# Patient Record
Sex: Female | Born: 1950 | Race: White | Hispanic: No | Smoking: Never smoker
Health system: Southern US, Community
[De-identification: ages and names within clinical notes are randomized; demographics above are authoritative.]

## PROBLEM LIST (undated history)

## (undated) DIAGNOSIS — M5431 Sciatica, right side: Secondary | ICD-10-CM

## (undated) DIAGNOSIS — E7800 Pure hypercholesterolemia, unspecified: Secondary | ICD-10-CM

## (undated) DIAGNOSIS — N840 Polyp of corpus uteri: Secondary | ICD-10-CM

## (undated) DIAGNOSIS — E119 Type 2 diabetes mellitus without complications: Secondary | ICD-10-CM

## (undated) DIAGNOSIS — I2510 Atherosclerotic heart disease of native coronary artery without angina pectoris: Secondary | ICD-10-CM

## (undated) DIAGNOSIS — N182 Chronic kidney disease, stage 2 (mild): Secondary | ICD-10-CM

## (undated) DIAGNOSIS — M1990 Unspecified osteoarthritis, unspecified site: Secondary | ICD-10-CM

## (undated) DIAGNOSIS — Z8679 Personal history of other diseases of the circulatory system: Secondary | ICD-10-CM

## (undated) DIAGNOSIS — H269 Unspecified cataract: Secondary | ICD-10-CM

## (undated) DIAGNOSIS — I10 Essential (primary) hypertension: Secondary | ICD-10-CM

## (undated) HISTORY — DX: Unspecified cataract: H26.9

## (undated) HISTORY — PX: TUBAL LIGATION: SHX77

## (undated) HISTORY — DX: Unspecified osteoarthritis, unspecified site: M19.90

## (undated) HISTORY — PX: CARDIAC CATHETERIZATION: SHX172

## (undated) HISTORY — DX: Essential (primary) hypertension: I10

## (undated) HISTORY — PX: JOINT REPLACEMENT: SHX530

## (undated) HISTORY — DX: Pure hypercholesterolemia, unspecified: E78.00

---
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
## 1977-08-15 HISTORY — PX: CHOLECYSTECTOMY OPEN: SUR202

## 1988-08-15 HISTORY — PX: TOE SURGERY: SHX1073

## 1998-01-14 ENCOUNTER — Other Ambulatory Visit: Admission: RE | Admit: 1998-01-14 | Discharge: 1998-01-14 | Payer: Self-pay | Admitting: Obstetrics and Gynecology

## 1998-11-19 ENCOUNTER — Other Ambulatory Visit: Admission: RE | Admit: 1998-11-19 | Discharge: 1998-11-19 | Payer: Self-pay | Admitting: Obstetrics and Gynecology

## 2002-09-15 DIAGNOSIS — I2510 Atherosclerotic heart disease of native coronary artery without angina pectoris: Secondary | ICD-10-CM

## 2002-09-15 HISTORY — DX: Atherosclerotic heart disease of native coronary artery without angina pectoris: I25.10

## 2002-10-10 ENCOUNTER — Inpatient Hospital Stay (HOSPITAL_COMMUNITY): Admission: EM | Admit: 2002-10-10 | Discharge: 2002-10-11 | Payer: Self-pay | Admitting: *Deleted

## 2002-10-10 ENCOUNTER — Encounter: Payer: Self-pay | Admitting: *Deleted

## 2002-10-11 ENCOUNTER — Encounter (INDEPENDENT_AMBULATORY_CARE_PROVIDER_SITE_OTHER): Payer: Self-pay | Admitting: *Deleted

## 2004-11-22 ENCOUNTER — Ambulatory Visit: Payer: Self-pay

## 2004-11-25 ENCOUNTER — Inpatient Hospital Stay (HOSPITAL_BASED_OUTPATIENT_CLINIC_OR_DEPARTMENT_OTHER): Admission: RE | Admit: 2004-11-25 | Discharge: 2004-11-25 | Payer: Self-pay | Admitting: Cardiology

## 2004-11-25 ENCOUNTER — Ambulatory Visit: Payer: Self-pay | Admitting: Cardiology

## 2004-12-03 ENCOUNTER — Ambulatory Visit: Payer: Self-pay | Admitting: Cardiology

## 2004-12-31 ENCOUNTER — Ambulatory Visit: Payer: Self-pay | Admitting: Gastroenterology

## 2005-01-14 ENCOUNTER — Ambulatory Visit: Payer: Self-pay | Admitting: Gastroenterology

## 2005-11-15 ENCOUNTER — Ambulatory Visit: Payer: Self-pay | Admitting: Cardiology

## 2006-01-12 ENCOUNTER — Ambulatory Visit: Payer: Self-pay | Admitting: Gastroenterology

## 2006-02-24 ENCOUNTER — Ambulatory Visit (HOSPITAL_COMMUNITY): Admission: RE | Admit: 2006-02-24 | Discharge: 2006-02-24 | Payer: Self-pay | Admitting: Gastroenterology

## 2006-03-01 ENCOUNTER — Ambulatory Visit: Payer: Self-pay | Admitting: Gastroenterology

## 2006-12-20 ENCOUNTER — Ambulatory Visit (HOSPITAL_COMMUNITY): Admission: RE | Admit: 2006-12-20 | Discharge: 2006-12-20 | Payer: Self-pay | Admitting: Internal Medicine

## 2007-05-07 ENCOUNTER — Ambulatory Visit: Payer: Self-pay | Admitting: Gastroenterology

## 2007-10-29 DIAGNOSIS — K649 Unspecified hemorrhoids: Secondary | ICD-10-CM

## 2007-10-29 DIAGNOSIS — I10 Essential (primary) hypertension: Secondary | ICD-10-CM | POA: Insufficient documentation

## 2007-10-29 DIAGNOSIS — M159 Polyosteoarthritis, unspecified: Secondary | ICD-10-CM | POA: Insufficient documentation

## 2008-02-18 ENCOUNTER — Observation Stay (HOSPITAL_COMMUNITY): Admission: EM | Admit: 2008-02-18 | Discharge: 2008-02-19 | Payer: Self-pay | Admitting: *Deleted

## 2008-02-18 ENCOUNTER — Ambulatory Visit: Payer: Self-pay | Admitting: Cardiology

## 2008-02-19 ENCOUNTER — Ambulatory Visit: Payer: Self-pay

## 2008-03-06 ENCOUNTER — Ambulatory Visit: Payer: Self-pay | Admitting: Cardiology

## 2008-10-16 ENCOUNTER — Ambulatory Visit: Payer: Self-pay | Admitting: Cardiology

## 2009-07-15 HISTORY — PX: CATARACT EXTRACTION W/ INTRAOCULAR LENS  IMPLANT, BILATERAL: SHX1307

## 2010-05-14 ENCOUNTER — Other Ambulatory Visit: Admission: RE | Admit: 2010-05-14 | Discharge: 2010-05-14 | Payer: Self-pay | Admitting: Internal Medicine

## 2010-10-18 ENCOUNTER — Telehealth (INDEPENDENT_AMBULATORY_CARE_PROVIDER_SITE_OTHER): Payer: Self-pay | Admitting: *Deleted

## 2010-10-26 NOTE — Progress Notes (Signed)
  Request received from [facility] sent to [facility]  October 18, 2010 8:41 AM

## 2010-11-09 ENCOUNTER — Encounter: Payer: Self-pay | Admitting: Cardiology

## 2010-11-18 ENCOUNTER — Encounter: Payer: Self-pay | Admitting: Cardiology

## 2010-11-18 ENCOUNTER — Ambulatory Visit (INDEPENDENT_AMBULATORY_CARE_PROVIDER_SITE_OTHER): Payer: PRIVATE HEALTH INSURANCE | Admitting: Cardiology

## 2010-11-18 VITALS — BP 100/60 | HR 59 | Resp 18 | Ht 64.0 in | Wt 210.4 lb

## 2010-11-18 DIAGNOSIS — I10 Essential (primary) hypertension: Secondary | ICD-10-CM

## 2010-11-18 DIAGNOSIS — I219 Acute myocardial infarction, unspecified: Secondary | ICD-10-CM

## 2010-11-18 DIAGNOSIS — I5181 Takotsubo syndrome: Secondary | ICD-10-CM

## 2010-11-18 NOTE — Progress Notes (Signed)
HPI:  The patient is in for follow up. She is doing well.  She denies any chest pain and is getting along well.  In reconstructing her history from [place] and [facility] it would appear that her original presentation was with non obstructive disease, EF of 25 percent, with mitral regurg.  She underwent IVUS in a clinical protocol.  She had followup which demonstrated preserved LV function on her repeat IVUS study.  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  She denies current symptoms and continues to get along well.    Current Outpatient Prescriptions  Medication Sig Dispense Refill  . aspirin 81 MG tablet Take 81 mg by mouth daily.        Marland Kitchen atenolol (TENORMIN) 100 MG tablet Take 100 mg by mouth daily.        . Cholecalciferol (VITAMIN D-3) 5000 UNITS TABS Take 1 tablet by mouth daily.        . enalapril (VASOTEC) 10 MG tablet Take 10 mg by mouth daily.        Marland Kitchen esomeprazole (NEXIUM) 40 MG capsule Take 40 mg by mouth daily before breakfast.        . fenofibrate micronized (LOFIBRA) 134 MG capsule Take 134 mg by mouth daily before breakfast.        . hydrochlorothiazide 25 MG tablet Take 25 mg by mouth daily.        . pantoprazole (PROTONIX) 40 MG tablet Take 40 mg by mouth daily.        . potassium chloride (K-DUR,KLOR-CON) 10 MEQ tablet Take 10 mEq by mouth 2 (two) times daily.        . pravastatin (PRAVACHOL) 40 MG tablet Take 40 mg by mouth daily.        . Loratadine (CLARITIN PO) OTC with no directions included       . DISCONTD: hydrochlorothiazide (,MICROZIDE/HYDRODIURIL,) 12.5 MG capsule Take 12.5 mg by mouth daily.          No Known Allergies  Past Medical History  Diagnosis Date  . Hemorrhoids   . Arthritis   . HTN (hypertension)   . Myocardial infarction   . CAD (coronary artery disease)   . Coronary disease     nonobstructive, history of  . Discomfort in chest     musculoskeletal  . Hypercholesterolemia   . Gastric reflux     history of     Past Surgical History  Procedure Date  . Cardiac catheterization November 25 2004    left heart, minor coronary atherosclerosis  . Angioplasty   . Cholecystectomy   . Cesarean section     No family history on file.  History   Social History  . Marital Status: Divorced    Spouse Name: N/A    Number of Children: N/A  . Years of Education: N/A   Occupational History  . Not on file.   Social History Main Topics  . Smoking status: Never Smoker   . Smokeless tobacco: Not on file  . Alcohol Use: 0.0 oz/week     occasional  . Drug Use: No  . Sexually Active: Not on file   Other Topics Concern  . Not on file   Social History Narrative  . No narrative on file    ROS: Please see the HPI.  All other systems reviewed and negative.  PHYSICAL EXAM:  BP 100/60  Pulse 59  Resp 18  Ht 5\' 4"  (1.626 m)  Wt 210 lb 6.4  oz (95.437 kg)  BMI 36.12 kg/m2  General: Well developed, well nourished, in no acute distress. Head:  Normocephalic and atraumatic. Neck: no JVD Spine consistent with scoliosis. Lungs: Clear to auscultation and percussion. Heart: Normal S1 and S2.  No murmur, rubs or gallops.  Abdomen:  Normal bowel sounds; soft; non tender; no organomegaly Pulses: Pulses normal in all 4 extremities. Extremities: No clubbing or cyanosis. No edema. Neurologic: Alert and oriented x 3.  EKG:  NSR.  WNL.    ASSESSMENT AND PLAN:

## 2010-11-18 NOTE — Patient Instructions (Addendum)
Your physician has requested that you have an echocardiogram. Echocardiography is a painless test that uses sound waves to create images of your heart. It provides your doctor with information about the size and shape of your heart and how well your heart's chambers and valves are working. This procedure takes approximately one hour. There are no restrictions for this procedure.   Your physician recommends that you continue on your current medications as directed. Please refer to the Current Medication list given to you today.    Your physician recommends that you schedule a follow-up appointment as needed  

## 2010-11-24 NOTE — Assessment & Plan Note (Signed)
See above.  Would favor repeat echo to assess her LV at this point.  Her presentation had EF of 25%, and MR, but following was resolve.  If resolved, then no further workup at the present time.

## 2010-11-24 NOTE — Assessment & Plan Note (Signed)
Controlled. Followed by her primary Dr. Marlowe Shores.  He does her labs.

## 2010-11-24 NOTE — Assessment & Plan Note (Signed)
Probably on the basis of Takotsubo.  Old cath and newer report reviewed.  Marked LV dysfunction initially with recovering in follow up.

## 2010-11-24 NOTE — Telephone Encounter (Signed)
This encounter was created in error - please disregard.

## 2010-11-25 ENCOUNTER — Ambulatory Visit (HOSPITAL_COMMUNITY): Payer: PRIVATE HEALTH INSURANCE | Attending: Cardiology

## 2010-11-25 DIAGNOSIS — I379 Nonrheumatic pulmonary valve disorder, unspecified: Secondary | ICD-10-CM | POA: Insufficient documentation

## 2010-11-25 DIAGNOSIS — I059 Rheumatic mitral valve disease, unspecified: Secondary | ICD-10-CM | POA: Insufficient documentation

## 2010-11-25 DIAGNOSIS — I079 Rheumatic tricuspid valve disease, unspecified: Secondary | ICD-10-CM | POA: Insufficient documentation

## 2010-11-25 DIAGNOSIS — I10 Essential (primary) hypertension: Secondary | ICD-10-CM | POA: Insufficient documentation

## 2010-11-25 DIAGNOSIS — R079 Chest pain, unspecified: Secondary | ICD-10-CM | POA: Insufficient documentation

## 2010-11-25 DIAGNOSIS — I5181 Takotsubo syndrome: Secondary | ICD-10-CM

## 2010-11-25 DIAGNOSIS — E785 Hyperlipidemia, unspecified: Secondary | ICD-10-CM | POA: Insufficient documentation

## 2010-11-30 ENCOUNTER — Telehealth: Payer: Self-pay | Admitting: Cardiology

## 2010-11-30 NOTE — Telephone Encounter (Signed)
I spoke with the pt and made her aware of Echo results.  

## 2010-11-30 NOTE — Telephone Encounter (Signed)
Pt had echo 4-12-would like results

## 2010-12-28 NOTE — Letter (Signed)
March 06, 2008    Lucky Cowboy, MD  [address]  [place], [place] [address]   RE:  [name]  MRN:  [id number]  /  DOB:  1950/09/25   Dear [name],   I had the pleasure of seeing [name] in the office today in  followup.  Cardiac wise, she is doing well.  She has not been having any  ongoing chest pain or shortness of breath.  She feels well.  She was  admitted to the hospital with some atypical symptoms.  Chest x-ray  demonstrated no acute bony abnormalities, but evidence of  cholecystectomy and thoracic scoliosis.  There were no acute findings.  Her cardiac enzymes were negative.  She underwent a radionuclide imaging  study at our office.  This demonstrated only apical thinning.  This was  read as possible small prior infarct or breast attenuation, and given  the normal ejection fraction, and 72% EF, we felt that this was likely  breast attenuation.  She has had no further symptoms and actually is  feeling well.   MEDICATIONS:  1. Atenolol 100 mg daily.  2. Aspirin 81 mg daily.  3. Pravastatin 40 mg daily.  4. Enalapril 10 mg daily.  5. Protonix 40 daily.  6. Hydrochlorothiazide 12.5 daily.  7. K-Dur 10 b.i.d.   PHYSICAL EXAMINATION:  She is alert and oriented in no distress.  Blood  pressure is 123/78, pulse 62.  The lung fields are clear and the cardiac  rhythm is regular.   Overall, she appears to be doing well.  She has had no further symptoms.  We will see her back in 6 months' time.  She is following up with you,  and I know you will likely get a BMET on her.  Please do not hesitate to  call me at any time.    Sincerely,      Arturo Morton. Riley Kill, MD, [facility]  Electronically Signed    TDS/MedQ  DD: 03/06/2008  DT: 03/07/2008  Job #: [id number]

## 2010-12-28 NOTE — Assessment & Plan Note (Signed)
[facility]                            CARDIOLOGY OFFICE NOTE   [name], [name]                      MRN:          [id number]  DATE:10/16/2008                            DOB:          05-19-1951    [name] is in for followup.  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  Cardiac wise, she continues to get along reasonably well.  The patient  underwent catheterization in 2006.  At that time, she had normal left  ventricular function with resolution of wall motion abnormalities.  She  had an intravascular ultrasound done at that time.  She has continued to  do well from a cardiac standpoint.  She denies current chest pain.   MEDICATIONS:  1. K-Dur 10 mEq p.o. b.i.d.  2. Aspirin 81 mg daily.  3. Atenolol 100 mg daily.  4. Pravastatin 40 mg.  5. Enalapril 10 mg one daily.  6. Protonix 40 mg daily.  7. Hydrochlorothiazide 12.5 daily.  8. Claritin over-the-counter.  9. Fenofibrate 134 mg.   On physical, she is alert and oriented in no distress.  The blood  pressure is 114/80, the pulse is 57.  The lung fields are clear and the  cardiac rhythm is regular.   There is no extremity edema.   The electrocardiogram demonstrates sinus bradycardia and has a tracing.  It is otherwise unremarkable.   IMPRESSION:  1. History of nonobstructive coronary disease.  2. History of musculoskeletal chest discomfort in the past.  3. Hypertension.  4. Hypercholesterolemia.   RECOMMENDATIONS:  1. Continued followup with Dr. Michele Mcalpine.  2. Continue current medical regimen.  3. Followup in Cardiology in 1 year at which time I will recommend a      2D echocardiogram to be rechecked.     Arturo Morton. Riley Kill, MD, [facility]  Electronically Signed    TDS/MedQ  DD: 10/26/2008  DT: 10/27/2008  Job #: [id number]

## 2010-12-28 NOTE — Assessment & Plan Note (Signed)
[facility]                         GASTROENTEROLOGY OFFICE NOTE   AMEYA, VOWELL                      MRN:          [id number]  DATE:05/07/2007                            DOB:          Nov 09, 1950    PROBLEM:  Rectal pain.   REASON:  Ms. Tonelli has returned complaining of 3 days of severe rectal  pain. She has had scant amounts of bleeding. She moves her bowels  regularly. She has a history of hemorrhoids. Pain has significant  improved over the last 24 hours.   PHYSICAL EXAMINATION:  Pulse 76, blood pressure 114/70, weight 236.  RECTAL EXAM: She has external hemorrhoids with 1 partially thrombosed  hemorrhoid with 2 areas of external clot.   IMPRESSION:  Thrombosed hemorrhoid that has partially spontaneously  decompressed.   RECOMMENDATIONS:  1. Warm soaks.  2. Anusol HC suppositories.   I carefully instructed Ms. Defrank to contact me if symptoms should worsen  at which point I would send her for a surgical evaluation.     Barbette Hair. Arlyce Dice, MD,FACG  Electronically Signed    RDK/MedQ  DD: 05/07/2007  DT: 05/08/2007  Job #: [id number]   cc:   Lucky Cowboy, M.D.

## 2010-12-28 NOTE — H&P (Signed)
[name], DAM NO.:  [id number]   MEDICAL RECORD NO.:  [id number]          PATIENT TYPE:  INP   LOCATION:  2001                         FACILITY:  [facility]   PHYSICIAN:  Rollene Rotunda, MD, FACCDATE OF BIRTH:  [birth year]   DATE OF ADMISSION:  02/18/2008  DATE OF DISCHARGE:                              HISTORY & PHYSICAL   PRIMARY:  Lucky Cowboy, M.D.   CARDIOLOGIST:  Arturo Morton. Riley Kill, MD.   REASON FOR PRESENTATION:  Evaluate patient with chest pain.   HISTORY OF PRESENT ILLNESS:  The patient is a pleasant 60 year old white  female with history of nonobstructive coronary disease.  She had been  doing well.  In fact, she moved her son to a first floor apartment over  the weekend without difficulty.  This evening while she was trying to of  to bed, and lying down, and she developed chest discomfort.  It was a  substernal discomfort.  It was 5/10.  Did not radiate.  It was not made  worse with movement or breathing.  It was a hurting.  There was some  mild nausea, but no shortness of breath.  There was no diaphoresis.  It  was similar to the discomfort that she had in 2006.  She had her  daughter drive her to the ER.  By that time she got here, her pain was  essentially resolved.  Her EKG that demonstrated no acute findings.  The  first point of care markers were negative.   PAST MEDICAL HISTORY:  Nonobstructive coronary disease (the patient was  in the REVERSAL trial in 2006.  This demonstrated 30% mid-LAD stenosis.  There was no disease elsewhere.  Her EF was normal.), hypertension,  borderline diabetes.   PAST SURGICAL HISTORY:  1. Cholecystectomy.  2. Right-and-left bone spurs removed from her feet.  3. C-section x3.   ALLERGIES/INTOLERANCES:  None.   MEDICATIONS:  1. Aspirin 81 mg daily.  2. Potassium 10 mg daily.  3. Meloxicam 15 mg daily.  4. Enalapril 20 mg daily.  5. Atenolol 100 mg daily.  6. Pravastatin 40 mg daily.  7. Protonix 40  mg daily.   SOCIAL HISTORY:  The patient is divorced.  She works in a school.  She  is relatively sedentary.  She has never smoked cigarettes.  She has  three children.   FAMILY HISTORY:  Noncontributory for early coronary disease in first-  degree relatives, though her father did have an MI at age 90.   REVIEW OF SYSTEMS:  As stated in the HPI, otherwise negative for all  other systems.   PHYSICAL EXAMINATION:  GENERAL:  The patient is in no acute distress.  VITAL SIGNS:  Blood pressure 126/75, heart rate 55 and regular,  respiratory rate 16, afebrile.  HEENT: Eyes unremarkable.  Pupils equal, round, and react to light.  Fundi not visualized.  Oral mucosa unremarkable.  NECK: No jugular distention at 45 degrees, carotid upstroke brisk and  symmetric.  No bruits, no thyromegaly.  LYMPHATICS:  No cervical, axillary, or inguinal adenopathy.  LUNGS:  Clear to auscultation bilaterally.  BACK:  No costovertebral mass.  CHEST:  Unremarkable.  HEART:  PMI not displaced or sustained, S1-S2 within normal limits, no  S3-S4, no clicks, rubs, or murmurs.  ABDOMEN: Obese, positive bowel sounds.  Normal in frequency and pitch,  no bruits, no rebound, no guarding, no midline pulsatile mass.  No  hepatomegaly, no splenomegaly.  SKIN:  No rashes, no nodules.  EXTREMITIES:  2+ pulses throughout, no edema, no cyanosis, no clubbing.  NEURO:  Oriented to person, place, and time.  Cranial nerves II-XII  grossly intact, motor grossly intact.   EKG:  Sinus rhythm, rate 53, axis within normal, intervals normal  limits, no acute ST wave changes.   LABS:  WBC 9.4, hemoglobin 14.1, platelets 258, sodium 133, potassium  4.0, glucose 155, BUN 18, creatinine 0.93.   CHEST X-RAY:  No acute disease.   ASSESSMENT/PLAN:  1. Chest.  The patient's chest discomfort has some typical and some      atypical features.  It is the same pain that she had in 2006 when      she had nonobstructive disease.  There is no  objective evidence of      ischemia.  At this point, I think the pretest probability of      obstructive coronary disease as the etiology is moderate.  I will      cycle enzymes.  She will continue her home medications.  I would      suggest that if her cardiac enzymes are negative, should would be a      reasonable candidate for stress perfusion imaging.  2. Dyslipidemia.  Check a lipid profile, fasting.  She will continue      the pravastatin for now.  3. Borderline diabetes.  Will check a hemoglobin A1c.  This is      followed by primary care physician.  4. Obesity.  She will be counseled about the need to lose weight and      the proper way to do this with diet and exercise.      Rollene Rotunda, MD, [facility]  Electronically Signed     JH/MEDQ  D:  02/18/2008  T:  02/18/2008  Job:  [id number]   cc:   Arturo Morton. Riley Kill, MD, [facility]  Lucky Cowboy, M.D.

## 2010-12-28 NOTE — Discharge Summary (Signed)
[name], [name]               ACCOUNT NO.:  [id number]   MEDICAL RECORD NO.:  [id number]          PATIENT TYPE:  INP   LOCATION:  2001                         FACILITY:  [facility]   PHYSICIAN:  Arturo Morton. Riley Kill, MD, FACCDATE OF BIRTH:  [birth year]   DATE OF ADMISSION:  02/18/2008  DATE OF DISCHARGE:  02/19/2008                               DISCHARGE SUMMARY   PRIMARY CARDIOLOGIST:  Maisie Fus D. Riley Kill, MD, [facility]   PRIMARY CARE PHYSICIAN:  Lucky Cowboy, M.D.   PROCEDURES PERFORMED DURING HOSPITALIZATION:  None.   FINAL DISCHARGE DIAGNOSES:  1. Nonobstructive coronary artery disease.  2. Chest pain, probable musculoskeletal.  3. Hypertension.  4. Hypercholesterolemia.  5. History of cardiac catheterization in April 2006 revealing      nonobstructive coronary artery disease with 30% mid left anterior      descending stenosis without disease elsewhere.  Her ejection      fraction was normal.   HISTORY OF PRESENT ILLNESS:  This is a 60 year old Caucasian female with  a history of nonobstructive coronary artery disease who had been doing  well in her usual state of health until the day of admission when she  was turning over in bed and lying down and she developed chest  discomfort.  It was substernal and rated 5/10 with no radiation.  The  patient had no associated nausea or shortness of breath or diaphoresis.  The patient came to the emergency room and by that time her pain was  very resolved.  EKG demonstrated no acute findings.  The patient was  seen and admitted to rule out MI by Dr. Rollene Rotunda.  The patient had  cardiac enzymes cycled, which were found to be negative x3.  She was  scheduled for an outpatient stress Myoview at 10:00 a.m. today, day of  discharge and will follow up to have that appointment.  The patient will  also followup with Dr. Bonnee Quin post procedure.  The patient was  found to be mildly hypotensive throughout hospitalization with a blood  pressure  90 to 102 systolic.  Her enalapril was decreased from 20 mg  once a day to 10 mg once a day on discharge.  The patient's heart rate  remained in the 50s throughout hospitalization.  Her atenolol was held  the morning of discharge prior to the stress test.  All other labs were  found to be negative and the patient was anxious to return home.   LABORATORY DATA:  Current labs, troponin 0.01, 0.01, 0.01, TSH 5.7,  sodium 136, potassium 4.2, chloride 103, CO2 26, BUN 16, creatinine  0.98, glucose 123, total cholesterol 174, triglycerides 188, HDL 41, LDL  95.   PHYSICAL EXAMINATION:  Vital Signs on discharge, blood pressure 90/46,  heart rate 51, respirations 18, temperature 97.1, and O2 sat 97% on room  air.   DISCHARGE MEDICATIONS:  1. Enalapril 10 mg daily (new lower dose secondary to hypotension from      20 mg daily).  2. Aspirin 81 mg daily.  3. Potassium 10 mEq daily.  4. Protonix 40 mg daily.  5.  Pravastatin 40 mg daily.  6. Tenormin 100 mg daily (to hold this a.m. prior to stress Myoview).  7. Hydrochlorothiazide 12.5 mg daily.   ALLERGIES:  No known drug allergies.   FOLLOWUP PLANS AND APPOINTMENTS:  1. The patient is scheduled for a stress nuclear study at 10:00 a.m.      today at the [facility] Office after discharge.  2. The patient is to follow up with Dr. Bonnee Quin on March 06, 2008,      at 11:15 a.m.  3. The patient is to follow up with primary care physician for      continued medical management.  4. The patient has been advised on medication change; enalapril dose      to decrease from 20 to 10 mg daily.   Time spent with the patient to include physician time 30 minutes.      Bettey Mare. Lyman Bishop, NP      Arturo Morton. Riley Kill, MD, [facility]  Electronically Signed    KML/MEDQ  D:  02/19/2008  T:  02/19/2008  Job:  [id number]   cc:   Lucky Cowboy, M.D.

## 2010-12-31 NOTE — Op Note (Signed)
[name], [name]                           ACCOUNT NO.:  [id number]   MEDICAL RECORD NO.:  [id number]                   PATIENT TYPE:  INP   LOCATION:  3713                                 FACILITY:  [facility]   PHYSICIAN:  Vida Roller, M.D.                DATE OF BIRTH:  01-Sep-1950   DATE OF PROCEDURE:  DATE OF DISCHARGE:                                 OPERATIVE REPORT   PROCEDURES PERFORMED:  1. Left heart catheterization.  2. Selective coronary angiography.  3. Left ventriculography.   HISTORY OF PRESENT ILLNESS:  This is a 60 year old white female with a  history of hypertension who developed substernal chest pain last night  around 1800.  The pressure worsened to the point where she presented to the  emergency department around 11:30, took a sublingual nitroglycerin, had some  aspirin, and her pain went away.  She ended up having positive enzymes,  consistent with an acute myocardial infarction, and she was taken to the  cardiac catheterization laboratory after stabilization medically.   DETAILS OF THE PROCEDURE:  After obtaining informed consent, the patient was  taken to the cardiac catheterization laboratory in the fasting state.  There  she was prepped and draped in the usual sterile manner, and conscious  sedation was obtained using 1 mg of Versed and 50 mcg of fentanyl.  The  right femoral artery was cannulated using the modified Seldinger technique  with a 6 French 10 cm sheath, and left heart catheterization was performed  using a 6 French Judkins left #4, a 6 French Judkins right #4, and a 6  French angled pigtail catheter.  The pigtail catheter was advanced under  fluoroscopic guidance in the ascending aorta, and prolapsed across the  aortic valve under continuous fluoroscopic guidance.  There it was connected  to the pressure tracing system where left ventricular pressure was measured.  It was then connected to the [facility] system where apparently injection  was  done at a rate of 12 cc a second for a total of 36 cc of dye, half second  rate of rise, 600 PSI.  This image was a 30 degree RAO view.  At the  conclusion of the left ventriculogram, the catheter was reconnected to the  pressure tracing system, and then the catheter was prolapsed across the  aortic valve under continuous pressure monitoring.  The catheter was then  removed from the patient, the sheath was sown in place, and the patient was  then enrolled in the CETP trial where IVUS evaluation of her ramus coronary  artery was performed.  That will be in a separate dictation by my colleague,  Dr. Veneda Melter.  Total fluoroscopic time was 2.6 minutes.  Total iodinized  contrast was 120 cc.   RESULTS:  Aortic pressure was measured at 120/82, with a mean arterial  pressure of 99 mmHg.   The left ventricular pressure was  measured at 119/17, with an end diastolic  pressure of 29 mmHg.   Selective coronary angiography:  The left main coronary artery is a large  artery giving off a dominant circumflex coronary artery and a left anterior  descending.  There is no significant disease in the left main coronary  artery.   The left anterior descending coronary artery is a large artery with two  normal-sized diagonal branches.  There is no disease in the left anterior  descending coronary artery.   The left circumflex coronary artery is a large artery which is the dominant  vessel giving off several moderate-sized obtuse marginals, and then a small  posterior descending and posterolateral system.   There is a ramus intermedius artery with bifurcates along the anterolateral  wall.  It has a 25% stenosis in his ostium.   The right coronary artery is a small nondominant vessel which was  angiographed but unremarkable.   The left ventriculogram reveals significantly depressed left ventricular  function at 25% with anterior akinesis, apical hypokinesis, and inferior  dyskinesis.  There was  3+ mitral regurgitation.   ASSESSMENT:  1. Nonobstructive coronary disease.  2. Severe left ventricular dysfunction.  3. Rule out myocarditis.  Would recommend this patient be aggressively     treated with medical management to include ACE inhibitors, beta blockers.     She will need an echocardiogram to assess her overall wall motion, and we     will follow her from there.                                               Vida Roller, M.D.    JH/MEDQ  D:  10/10/2002  T:  10/11/2002  Job:  [id number]

## 2010-12-31 NOTE — Discharge Summary (Signed)
NAMELOISANN, ROACH                           ACCOUNT NO.:  [id number]   MEDICAL RECORD NO.:  [id number]                   PATIENT TYPE:  INP   LOCATION:  3713                                 FACILITY:  [facility]   PHYSICIAN:  Doylene Canning. Ladona Ridgel, M.D. [facility]           DATE OF BIRTH:  Sep 09, 1950   DATE OF ADMISSION:  10/10/2002  DATE OF DISCHARGE:  10/11/2002                                 DISCHARGE SUMMARY   PRIMARY DIAGNOSIS:  Chest pain.   SECONDARY DIAGNOSES:  1. Hypertension.  2. Obesity.   HISTORY OF PRESENT ILLNESS:  This is a 60 year old female with a past  medical history of hypertension who developed substernal chest pain at 6:00  p.m. the evening prior to admission, a pressure sensation.  The pain  worsened as the evening went on.  She presented to the emergency room at  11:30.  TUMS were taken with no improvement of the pain.  The pain did  improve since aspirin was given.  No nausea, vomiting, shortness of breath,  diaphoresis, or radiation of the pain.  The pain was midsternal, 5/10 in  severity, now complains of chest soreness.  Initial CK-MB and Troponins were  elevated.  She was admitted for evaluation.  The pain is not pleuritic.   HOSPITAL COURSE:  The patient was admitted and underwent a cardiac  catheterization.  An __________ of the LAD was performed for CETP study.  The patient's catheterization showed nonobstructive coronary disease with a  20% EF and 3+ MR.  She as scheduled for a TEE to rule out myocarditis.  TEE  showed mild mitral regurgitation with LV diastolic function.  She was to be  treated with medical therapy.  The patient was discharged to home on all her  previous medications.   DISCHARGE MEDICATIONS:  1. Vasotec 20 mg daily.  2. Hydrochlorothiazide 25 mg daily.  \3.  Potassium 20 mg daily.  1. Coated aspirin 325 mg daily.  2. Atenolol 10 mg daily.  3. She was to continue the Zocor as scheduled.   DISCHARGE INSTRUCTIONS:  The patient was to be  contacted by research for the  CETP study.  She was not to do any heavy lifting or strenuous activity x4  days, no driving for two.  Low-fat, low-salt, low-cholesterol diet.  She  was to call if she develops a lump in her groin or any drainage.  She was to  schedule an appointment to see a physician assistant at [facility] in two weeks  and have a 2D echocardiogram within two months.  Message was left with  office.  The patient was also instructed that she needed to call to schedule  this appointment.     Chinita Pester, C.R.N.P. [facility]                 Doylene Canning. Ladona Ridgel, M.D. [facility]    DS/MEDQ  D:  10/11/2002  T:  10/11/2002  Job:  [id number]   cc:   Lucky Cowboy, M.D.  [address], [address]  [place], [place] [address]  Fax: [phone]

## 2010-12-31 NOTE — Cardiovascular Report (Signed)
   [name], [name]                           ACCOUNT NO.:  [id number]   MEDICAL RECORD NO.:  [id number]                   PATIENT TYPE:  INP   LOCATION:  3713                                 FACILITY:  [facility]   PHYSICIAN:  Veneda Melter, M.D. [facility]               DATE OF BIRTH:  1950/08/24   DATE OF PROCEDURE:  10/10/2002  DATE OF DISCHARGE:                              CARDIAC CATHETERIZATION   PROCEDURE PERFORMED:  Intravascular ultrasound of the left anterior  descending.   DIAGNOSIS:  Coronary artery disease.   HISTORY:  The patient is a 60 year old white female who presented with  substernal chest discomfort.  She underwent cardiac catheterization by Dr.  Antoine Poche showing noncritical coronary artery disease.  She is referred for  intravascular ultrasound for participation in cholesterol-lowering study.   TECHNIQUE:  Informed consent had been obtained.  The patient was on the  table and a 6 French sheath in the right groin.  The patient was given  heparin intravenously to maintain ACT of approximately 200 seconds.  A 6  [place] guide catheter with side holes was used to engage the left  coronary artery, and selective guide shots obtained after the administration  of 200 mcg of intracoronary nitroglycerin. A 0.014 inch Hi-Torque Floppy  wire was advanced to the distal LAD and intravascular ultrasound performed  using elevated pullback.  Repeat angiography showed no evidence of vessel  damage. The guide catheter was then removed and the sheath secured in  position.  The patient was transferred to the holding area where the sheath  will be removed when the ACT returns to normal.  She tolerated the procedure  well.                                                Veneda Melter, M.D. [facility]    NG/MEDQ  D:  10/10/2002  T:  10/12/2002  Job:  [id number]   cc:   Lucky Cowboy, M.D.  [address], [address]  [place], [place] [address]  Fax: [phone]   [facility]

## 2010-12-31 NOTE — Cardiovascular Report (Signed)
NAMECLARENCE, DUNSMORE               ACCOUNT NO.:  [id number]   MEDICAL RECORD NO.:  [id number]          PATIENT TYPE:  OIB   LOCATION:  6501                         FACILITY:  [facility]   PHYSICIAN:  Arturo Morton. Riley Kill, M.D. [facility] BIRTH:  [birth year]   DATE OF PROCEDURE:  11/25/2004  DATE OF DISCHARGE:                              CARDIAC CATHETERIZATION   INDICATIONS FOR PROCEDURE:  Ms. [name] is a 60 year old female who presents  to the catheterization laboratory today for follow up research  catheterization.  The patient was enrolled in the CPEP trial and this  involves follow up catheterization and ultrasound.  She has been followed  for nearly two years, first by Dr. Chales Abrahams as part of the study, protocol  catheterization was required at two years and this had been planned since  the exception of enrollment.  The patient was informed of the risks,  benefits, and alternatives of cath.  She was agreeable to proceed.   PROCEDURE:  1.  Left heart catheterization.  2.  Selective coronary arteriography.  3.  Selective left ventriculography.  4.  Intravascular ultrasound of the left anterior descending  artery.   DESCRIPTION OF PROCEDURE:  The patient was brought to the catheterization  laboratory and prepped and draped in the usual fashion.  Through an anterior  puncture, the right femoral artery was easily entered and the 6 French  sheath was placed.  Following this, a view of the right coronary artery was  obtained.  Ventriculography was performed in the RAO projection.  Heparin  was given according to protocol at 50 units per kg.  ACTs were checked and  found to be between 200 and 300 seconds.  Following this, left coronary  artery angiography was performed with the JL4 guiding catheter.  Following  this, a Luge wire was passed down the left anterior descending  artery,  intravascular ultrasound catheter was then advanced into the mid portion of  the left anterior descending  artery.   The patient was applied to an  automatic pull back sled and follow up ultrasound was obtained for  conclusion of the study.  The patient tolerated the procedure well.  There  were no complications.  Follow up angiographic studies demonstrated no  adverse consequences from ultrasound.  Following this, the right groin was  prepped and draped.  We exchanged gloves.  An Angio-Seal device was then  utilized to close the right femoral artery.  She had no significant oozing  after application of the device.  She was taken to the holding area in  satisfactory clinical condition.  Closing ACT was 233 seconds.   PRESSURE DATA:  1.  Central aortic pressure 157/79, mean 112.  2.  Left ventricular 135/17.  3.  No gradient on pull back across the aortic valve.   ANGIOGRAPHIC DATA:  1.  Ventriculography in the RAO projection and overall systolic function      appeared to be vigorous, ejection fraction appeared to be normal.  2.  The left main coronary artery was free of critical disease.  3.  The left anterior descending  coursed  to the apex.  At the origin of the      second diagonal is an eccentric area of mild plaque where the vessel      bends with about 30% narrowing.  The distal vessel courses to the apex      and has mild luminal irregularities but is free of critical disease.  4.  There is a small ramus intermedius and previously there was noted to be      a 25% stenosis.  It is difficult to estimate this to be a reliable area      of narrowing and I do not see a specific focal area of narrowing.  5.  The circumflex provides a large marginal and posterolateral branch.      There are two marginal branches of the circumflex and its branches      appear to be free of significant disease.  6.  The right coronary artery is nondominant, there is dampening with      engagement to a small vessel size.   CONCLUSION:  1.  Normal left ventricular  function compared to the previous study, the       wall motion abnormalities and significant mitral regurgitation have      entirely resolved.  2.  Minor coronary atherosclerosis.  3.  Intravascular ultrasound demonstrating mild areas of eccentric plaquing      within the left anterior descending artery without high grade focal      obstruction through the proximal and mid vessel.   DISPOSITION:  The patient will need follow up in the office with Dr.  Riley Kill.      TDS/MEDQ  D:  11/25/2004  T:  11/25/2004  Job:  [id number]   cc:   Kirk Ruths, M.D.  [address]  [place]  [place] [address]  Fax: [phone]

## 2011-05-12 LAB — LIPID PANEL
Cholesterol: 174
LDL Cholesterol: 95

## 2011-05-12 LAB — COMPREHENSIVE METABOLIC PANEL
ALT: 22
Albumin: 3.8
Alkaline Phosphatase: 55
BUN: 16
Calcium: 9.2
Potassium: 4.2
Sodium: 136
Total Protein: 6.3

## 2011-05-12 LAB — DIFFERENTIAL
Basophils Absolute: 0.1
Basophils Relative: 1
Eosinophils Absolute: 0.3
Neutrophils Relative %: 68

## 2011-05-12 LAB — HEMOGLOBIN A1C
Hgb A1c MFr Bld: 6.1
Mean Plasma Glucose: 140

## 2011-05-12 LAB — BASIC METABOLIC PANEL
BUN: 18
CO2: 24
Chloride: 100
Creatinine, Ser: 0.93

## 2011-05-12 LAB — CBC
MCHC: 34
MCV: 87.2
Platelets: 258
WBC: 9.4

## 2011-05-12 LAB — POCT CARDIAC MARKERS
Myoglobin, poc: 106
Operator id: 270651

## 2011-05-12 LAB — CARDIAC PANEL(CRET KIN+CKTOT+MB+TROPI)
CK, MB: 0.7
CK, MB: 0.8
Relative Index: INVALID
Total CK: 63

## 2011-07-16 DIAGNOSIS — I5181 Takotsubo syndrome: Secondary | ICD-10-CM | POA: Insufficient documentation

## 2013-05-23 ENCOUNTER — Other Ambulatory Visit (HOSPITAL_COMMUNITY)
Admission: RE | Admit: 2013-05-23 | Discharge: 2013-05-23 | Disposition: A | Discharge status: Home / Self Care | Payer: BC Managed Care – PPO | Source: Ambulatory Visit | Attending: Internal Medicine | Admitting: Internal Medicine

## 2013-05-23 DIAGNOSIS — Z1151 Encounter for screening for human papillomavirus (HPV): Secondary | ICD-10-CM | POA: Insufficient documentation

## 2013-05-23 DIAGNOSIS — Z01419 Encounter for gynecological examination (general) (routine) without abnormal findings: Secondary | ICD-10-CM | POA: Insufficient documentation

## 2013-07-04 ENCOUNTER — Other Ambulatory Visit: Payer: Self-pay | Admitting: Internal Medicine

## 2013-07-04 MED ORDER — FENOFIBRATE MICRONIZED 134 MG PO CAPS: 134.0000 mg | ORAL_CAPSULE | Freq: Every day | ORAL | Status: DC

## 2013-08-27 ENCOUNTER — Encounter: Payer: Self-pay | Admitting: *Deleted

## 2013-08-28 ENCOUNTER — Ambulatory Visit (INDEPENDENT_AMBULATORY_CARE_PROVIDER_SITE_OTHER): Payer: BC Managed Care – PPO | Admitting: Emergency Medicine

## 2013-08-28 ENCOUNTER — Encounter: Payer: Self-pay | Admitting: Emergency Medicine

## 2013-08-28 VITALS — BP 118/82 | HR 64 | Temp 98.0°F | Resp 16 | Ht 64.0 in | Wt 212.0 lb

## 2013-08-28 DIAGNOSIS — I10 Essential (primary) hypertension: Secondary | ICD-10-CM

## 2013-08-28 DIAGNOSIS — R202 Paresthesia of skin: Secondary | ICD-10-CM

## 2013-08-28 DIAGNOSIS — R209 Unspecified disturbances of skin sensation: Secondary | ICD-10-CM

## 2013-08-28 DIAGNOSIS — Z79899 Other long term (current) drug therapy: Secondary | ICD-10-CM

## 2013-08-28 DIAGNOSIS — E782 Mixed hyperlipidemia: Secondary | ICD-10-CM

## 2013-08-28 DIAGNOSIS — E119 Type 2 diabetes mellitus without complications: Secondary | ICD-10-CM

## 2013-08-28 LAB — CBC WITH DIFFERENTIAL/PLATELET
Basophils Absolute: 0.1 10*3/uL (ref 0.0–0.1)
Basophils Relative: 1 % (ref 0–1)
EOS ABS: 0.4 10*3/uL (ref 0.0–0.7)
Eosinophils Relative: 4 % (ref 0–5)
HCT: 44.1 % (ref 36.0–46.0)
Hemoglobin: 14.8 g/dL (ref 12.0–15.0)
LYMPHS ABS: 2.6 10*3/uL (ref 0.7–4.0)
LYMPHS PCT: 26 % (ref 12–46)
MCH: 28.5 pg (ref 26.0–34.0)
MCHC: 33.6 g/dL (ref 30.0–36.0)
MCV: 85 fL (ref 78.0–100.0)
Monocytes Absolute: 0.6 10*3/uL (ref 0.1–1.0)
Monocytes Relative: 7 % (ref 3–12)
NEUTROS ABS: 6.2 10*3/uL (ref 1.7–7.7)
NEUTROS PCT: 62 % (ref 43–77)
PLATELETS: 322 10*3/uL (ref 150–400)
RBC: 5.19 MIL/uL — AB (ref 3.87–5.11)
RDW: 13.5 % (ref 11.5–15.5)
WBC: 9.8 10*3/uL (ref 4.0–10.5)

## 2013-08-28 LAB — MAGNESIUM: MAGNESIUM: 2 mg/dL (ref 1.5–2.5)

## 2013-08-28 LAB — LIPID PANEL
Cholesterol: 252 mg/dL — ABNORMAL HIGH (ref 0–200)
HDL: 51 mg/dL (ref >39)
LDL CALC: 123 mg/dL — AB (ref 0–99)
Total CHOL/HDL Ratio: 4.9 Ratio
Triglycerides: 390 mg/dL — ABNORMAL HIGH (ref <150)
VLDL: 78 mg/dL — ABNORMAL HIGH (ref 0–40)

## 2013-08-28 LAB — BASIC METABOLIC PANEL WITH GFR
BUN: 16 mg/dL (ref 6–23)
CHLORIDE: 92 meq/L — AB (ref 96–112)
CO2: 23 meq/L (ref 19–32)
Calcium: 9.6 mg/dL (ref 8.4–10.5)
Creat: 0.74 mg/dL (ref 0.50–1.10)
GFR, Est African American: >89 mL/min
GFR, Est Non African American: 86 mL/min
GLUCOSE: 418 mg/dL — AB (ref 70–99)
Potassium: 4.1 mEq/L (ref 3.5–5.3)
SODIUM: 128 meq/L — AB (ref 135–145)

## 2013-08-28 LAB — HEPATIC FUNCTION PANEL
ALBUMIN: 4.4 g/dL (ref 3.5–5.2)
ALK PHOS: 79 U/L (ref 39–117)
ALT: 46 U/L — AB (ref 0–35)
AST: 23 U/L (ref 0–37)
BILIRUBIN INDIRECT: 0.5 mg/dL (ref 0.0–0.9)
Bilirubin, Direct: 0.1 mg/dL (ref 0.0–0.3)
TOTAL PROTEIN: 7.2 g/dL (ref 6.0–8.3)
Total Bilirubin: 0.6 mg/dL (ref 0.3–1.2)

## 2013-08-28 LAB — HEMOGLOBIN A1C
HEMOGLOBIN A1C: 12.4 % — AB (ref <5.7)
MEAN PLASMA GLUCOSE: 309 mg/dL — AB (ref <117)

## 2013-08-28 LAB — VITAMIN B12: VITAMIN B 12: 866 pg/mL (ref 211–911)

## 2013-08-28 LAB — TSH: TSH: 2.413 u[IU]/mL (ref 0.350–4.500)

## 2013-08-28 NOTE — Patient Instructions (Signed)
Ischemic Stroke If any of these occur 911 Blood carries oxygen to all areas of your body. A stroke happens when your blood does not flow to your brain like normal. If this happens, your brain will not get the oxygen it needs and brain tissue will die. This is an emergency. Problems (symptoms) of a stroke usually happen suddenly. You may notice them when you wake up. They can include: Loss of feeling or weakness on one side of the body (face, arm, leg). Feeling confused. Trouble talking or understanding. Trouble seeing. Trouble walking. Feeling dizzy. Loss of balance or coordination. Severe headache without a cause. Trouble reading or writing. Get help within 3 4 hours of when your problems first started. If you do not know when your problems started, get help as soon as you can. This is important.  RISK FACTORS  Risk factors are things that make it more likely for you to have a stroke. These things include: High blood pressure (hypertension). High cholesterol. Diabetes. Heart disease. Having a buildup of fatty deposits in the blood vessels. Having an abnormal heart rhythm (atrial fibrillation). Being very overweight (obese). Smoking. Taking birth control pills, especially if you smoke. Not being active. Having a diet high in fats, salt, and calories. Drinking too much alcohol. Using illegal drugs. Being African American. Being over the age of 41. Having a family history of stroke. Having a history of blood clots, stroke, warning stroke (transient ischemic attack, TIA), or heart attack. Sickle cell disease. HOME CARE Take all medicines exactly as told by your doctor. Understand all your medicine instructions. You may need to take aspirin or warfarin medicine. Take warfarin exactly as told. Taking too much or too little warfarin is dangerous. Get regular blood tests as told, including the PT and INR tests. The test results help your doctor adjust your dose of warfarin. Your PT and  INR levels must be done as often as told by your doctor. Food can cause problems with warfarin and affect the results of your blood tests. This is true for foods high in vitamin K, such as spinach, kale, broccoli, cabbage, collard and turnip greens, brussels sprouts, peas, cauliflower, seaweed, and parsley, as well as beef and pork liver, green tea, and soybean oil. Eat the same amount of food high in vitamin K. Avoid major changes in your diet. Tell your doctor before changing your diet. Talk to a food specialist (dietitian) if you have questions. Many medicines can cause problems with warfarin and affect your PT and INR test results. Tell your doctor about all medicines you take. This includes vitamins and dietary pills (supplements). Be careful with aspirin and medicines that relieve redness, soreness, and puffiness (inflammation). Do not take or stop medicines unless your doctor tells you to. Warfarin can cause a lot of bruising or bleeding. Hold pressure over cuts for longer than normal. Talk to your doctor about other side effects of warfarin. Avoid sports or activities that may cause injury or bleeding. Be careful when you shave, floss your teeth, or use sharp objects. Avoid alcoholic drinks or drink very little alcohol while taking warfarin. Tell your doctor if you change how much alcohol you drink. Tell your dentist and other doctors that you take warfarin before procedures. If you are able to swallow, eat healthy foods. Eat 5 or more servings of fruits and vegetables a day. Eat soft foods, pureed foods, or eat small bites of food so you do not choke. Follow your diet program as told, if  you are given one. Keep a healthy weight. Stay active. Try to get at least 30 minutes of activity on most or all days. Do not smoke. Limit how much alcohol you drink even if you are not taking warfarin. Moderate alcohol use is: No more than 2 drinks each day for men. No more than 1 drink each day for women  who are not pregnant. Stop abusing drugs. Keep your home safe so you do not fall. Try: Putting grab bars in the bedroom and bathroom. Raising toilet seats. Putting a seat in the shower. Go to therapy sessions (physical, occupational, and speech) as told by your doctor. Use a walker or cane at all times if told to do so. Keep all doctor visits as told. GET HELP IF: Your personality changes. You have trouble swallowing. You are seeing two of everything. You are dizzy. You have a fever. Your skin starts to break down. GET HELP RIGHT AWAY IF:  The symptoms below may be a sign of an emergency. Do not wait to see if the symptoms go away. Call for help (911 in [place] Do not drive yourself to the hospital. You have sudden weakness or numbness on the face, arm, or leg (especially on one side of the body). You have sudden trouble walking or moving your arms or legs. You have sudden confusion. You have trouble talking or understanding. You have sudden trouble seeing in one or both eyes. You lose your balance or your movements are not smooth. You have a sudden, severe headache with no known cause. You have new chest pain or you feel your heart beating in a unsteady way. You are partly or totally unaware of what is going on around you. Document Released: 07/21/2011 Document Revised: 04/03/2013 Document Reviewed: 03/11/2012 [facility] Patient Information 2014 [place]. Hyponatremia  Hyponatremia is when the salt (sodium) in your blood is low. When salt becomes low, your cells take in extra water and puff up (swell). The puffiness can happen in the whole body. It mostly affects the brain and is very serious.  HOME CARE  Only take medicine as told by your doctor.  Follow any diet instructions you were given. This includes limiting how much fluid you drink.  Keep all doctor visits for tests as told.  Avoid alcohol and drugs. GET HELP RIGHT AWAY IF:  You start to twitch and shake  (seize).  You pass out (faint).  You continue to have watery poop (diarrhea) or you throw up (vomit).  You feel sick to your stomach (nauseous).  You are tired (fatigued), have a headache, are confused, or feel weak.  Your problems that first brought you to the doctor come back.  You have trouble following your diet instructions. MAKE SURE YOU:   Understand these instructions.  Will watch your condition.  Will get help right away if you are not doing well or get worse. Document Released: 04/13/2011 Document Revised: 10/24/2011 Document Reviewed: 04/13/2011 [facility] Patient Information 2014 [place], [place].

## 2013-08-28 NOTE — Progress Notes (Signed)
Subjective:    Patient ID: [name], female    DOB: 1951/02/27, 63 y.o.   MRN: [id number]  HPI Comments: 63 yo female presents for 3 month F/U for HTN, Cholesterol, DM, D. Deficient.  She has decreased soda. She is eating better snacks/ decreased fried foods. She has lost weight since last OV.LAST LABS SODIUM 131 T 195 TG 276 L 87 BS 412 A1C 10.3MAG 1.9 B12 541 INSULIN 31 D 60 SHE NOTES BS WAS HIGH AT WORK. She had new start of metformin at last OV and notes occasional miss of 2nd dosing. She denies any SE with new RX.  SUNDAY SHE NOTICED TWITCH WITH right CHIN MUSCLE and then occasionally into right hand. She notes symptoms on/ off since then. She denies any new changes or trauma to head/ neck/ shoulder. She has FMHX of DDD but denies any personal HX of DDD.  She also has had increaed green mucus x 5 days from sinuses. She notes mild pressure increase and ears feel full. She has not been taking any OTC remedies.   Current Outpatient Prescriptions on File Prior to Visit  Medication Sig Dispense Refill  . aspirin 81 MG tablet Take 81 mg by mouth daily.        Marland Kitchen atenolol (TENORMIN) 100 MG tablet Take 100 mg by mouth daily.        . Cholecalciferol (VITAMIN D-3) 5000 UNITS TABS Take 1 tablet by mouth daily.        . enalapril (VASOTEC) 10 MG tablet Take 10 mg by mouth daily.        Marland Kitchen esomeprazole (NEXIUM) 40 MG capsule Take 40 mg by mouth daily before breakfast.        . fenofibrate micronized (LOFIBRA) 134 MG capsule Take 1 capsule (134 mg total) by mouth daily before breakfast.  30 capsule  3  . hydrochlorothiazide 25 MG tablet Take 25 mg by mouth daily.        . pravastatin (PRAVACHOL) 40 MG tablet Take 40 mg by mouth daily.         No current facility-administered medications on file prior to visit.    Review of patient's allergies indicates no known allergies.  Past Medical History  Diagnosis Date  . Hemorrhoids   . Arthritis   . HTN (hypertension)   . Myocardial infarction    . CAD (coronary artery disease)   . Coronary disease     nonobstructive, history of  . Discomfort in chest     musculoskeletal  . Hypercholesterolemia   . Gastric reflux     history of        Review of Systems  Constitutional: Positive for fatigue.  HENT: Positive for congestion, postnasal drip and sinus pressure.   Neurological: Positive for tremors and numbness.  All other systems reviewed and are negative.   BP 118/82  Pulse 64  Temp(Src) 98 F (36.7 C) (Temporal)  Resp 16  Ht 5\' 4"  (1.626 m)  Wt 212 lb (96.163 kg)  BMI 36.37 kg/m2     Objective:   Physical Exam  Nursing note and vitals reviewed. Constitutional: She is oriented to person, place, and time. She appears well-developed and well-nourished. No distress.  HENT:  Head: Normocephalic and atraumatic.  Right Ear: External ear normal.  Left Ear: External ear normal.  Nose: Nose normal.  Mouth/Throat: Oropharynx is clear and moist. No oropharyngeal exudate.  Cloudy TMs  Eyes: Conjunctivae and EOM are normal. Pupils are equal, round, and  reactive to light.  Neck: Normal range of motion. Neck supple. No JVD present. No thyromegaly present.  Cardiovascular: Normal rate, regular rhythm, normal heart sounds and intact distal pulses.   Pulmonary/Chest: Effort normal and breath sounds normal.  Abdominal: Soft. Bowel sounds are normal. She exhibits no distension and no mass. There is no tenderness. There is no rebound and no guarding.  Musculoskeletal: Normal range of motion. She exhibits no edema and no tenderness.  Lymphadenopathy:    She has no cervical adenopathy.  Neurological: She is alert and oriented to person, place, and time. She has normal reflexes. No cranial nerve deficit. Coordination normal.  Skin: Skin is warm and dry. No rash noted. No erythema. No pallor.  Psychiatric: She has a normal mood and affect. Her behavior is normal. Judgment and thought content normal.          Assessment & Plan:   1.  3 month F/U for HTN, Cholesterol,DM, D. Deficient. Needs healthy diet, cardio QD and obtain healthy weight. Check Labs, Check BP if >130/80 call office, Check BS if >200 call office 2. ? Tingling vs hyponatremia vs vitamin def- check labs if symptoms increase ER. 3. Allergic rhinitis- Allegra OTC, increase H2o, allergy hygiene explained, w/c if SX increase/ check labs

## 2013-08-29 ENCOUNTER — Encounter (HOSPITAL_COMMUNITY): Payer: Self-pay | Admitting: Emergency Medicine

## 2013-08-29 ENCOUNTER — Other Ambulatory Visit: Payer: BC Managed Care – PPO

## 2013-08-29 ENCOUNTER — Other Ambulatory Visit: Payer: Self-pay

## 2013-08-29 ENCOUNTER — Emergency Department (HOSPITAL_COMMUNITY)
Admission: EM | Admit: 2013-08-29 | Discharge: 2013-08-29 | Disposition: A | Discharge status: Home / Self Care | Payer: BC Managed Care – PPO | Attending: Emergency Medicine | Admitting: Emergency Medicine

## 2013-08-29 DIAGNOSIS — R899 Unspecified abnormal finding in specimens from other organs, systems and tissues: Secondary | ICD-10-CM

## 2013-08-29 DIAGNOSIS — E7800 Pure hypercholesterolemia, unspecified: Secondary | ICD-10-CM | POA: Insufficient documentation

## 2013-08-29 DIAGNOSIS — R739 Hyperglycemia, unspecified: Secondary | ICD-10-CM

## 2013-08-29 DIAGNOSIS — M129 Arthropathy, unspecified: Secondary | ICD-10-CM | POA: Insufficient documentation

## 2013-08-29 DIAGNOSIS — K219 Gastro-esophageal reflux disease without esophagitis: Secondary | ICD-10-CM | POA: Insufficient documentation

## 2013-08-29 DIAGNOSIS — Z9889 Other specified postprocedural states: Secondary | ICD-10-CM | POA: Insufficient documentation

## 2013-08-29 DIAGNOSIS — E785 Hyperlipidemia, unspecified: Secondary | ICD-10-CM | POA: Insufficient documentation

## 2013-08-29 DIAGNOSIS — Z9861 Coronary angioplasty status: Secondary | ICD-10-CM | POA: Insufficient documentation

## 2013-08-29 DIAGNOSIS — R5381 Other malaise: Secondary | ICD-10-CM | POA: Insufficient documentation

## 2013-08-29 DIAGNOSIS — E119 Type 2 diabetes mellitus without complications: Secondary | ICD-10-CM | POA: Insufficient documentation

## 2013-08-29 DIAGNOSIS — Z79899 Other long term (current) drug therapy: Secondary | ICD-10-CM | POA: Insufficient documentation

## 2013-08-29 DIAGNOSIS — Z7982 Long term (current) use of aspirin: Secondary | ICD-10-CM | POA: Insufficient documentation

## 2013-08-29 DIAGNOSIS — I10 Essential (primary) hypertension: Secondary | ICD-10-CM | POA: Insufficient documentation

## 2013-08-29 DIAGNOSIS — I252 Old myocardial infarction: Secondary | ICD-10-CM | POA: Insufficient documentation

## 2013-08-29 DIAGNOSIS — R209 Unspecified disturbances of skin sensation: Secondary | ICD-10-CM | POA: Insufficient documentation

## 2013-08-29 DIAGNOSIS — R5383 Other fatigue: Secondary | ICD-10-CM

## 2013-08-29 DIAGNOSIS — Z95818 Presence of other cardiac implants and grafts: Secondary | ICD-10-CM | POA: Insufficient documentation

## 2013-08-29 DIAGNOSIS — R000 Tachycardia, unspecified: Secondary | ICD-10-CM | POA: Insufficient documentation

## 2013-08-29 DIAGNOSIS — I2510 Atherosclerotic heart disease of native coronary artery without angina pectoris: Secondary | ICD-10-CM | POA: Insufficient documentation

## 2013-08-29 LAB — CBC
HEMATOCRIT: 43.2 % (ref 36.0–46.0)
Hemoglobin: 15.4 g/dL — ABNORMAL HIGH (ref 12.0–15.0)
MCH: 28.9 pg (ref 26.0–34.0)
MCHC: 35.6 g/dL (ref 30.0–36.0)
MCV: 81.2 fL (ref 78.0–100.0)
PLATELETS: 303 10*3/uL (ref 150–400)
RBC: 5.32 MIL/uL — ABNORMAL HIGH (ref 3.87–5.11)
RDW: 13 % (ref 11.5–15.5)
WBC: 8.9 10*3/uL (ref 4.0–10.5)

## 2013-08-29 LAB — URINALYSIS, ROUTINE W REFLEX MICROSCOPIC
BILIRUBIN URINE: NEGATIVE
Glucose, UA: >1000 mg/dL — AB
HGB URINE DIPSTICK: NEGATIVE
KETONES UR: 15 mg/dL — AB
Leukocytes, UA: NEGATIVE
NITRITE: NEGATIVE
PROTEIN: NEGATIVE mg/dL
Specific Gravity, Urine: 1.037 — ABNORMAL HIGH (ref 1.005–1.030)
UROBILINOGEN UA: 0.2 mg/dL (ref 0.0–1.0)
pH: 5.5 (ref 5.0–8.0)

## 2013-08-29 LAB — COMPREHENSIVE METABOLIC PANEL
ALT: 52 U/L — AB (ref 0–35)
AST: 27 U/L (ref 0–37)
Albumin: 4.7 g/dL (ref 3.5–5.2)
Alkaline Phosphatase: 91 U/L (ref 39–117)
BILIRUBIN TOTAL: 0.6 mg/dL (ref 0.3–1.2)
BUN: 14 mg/dL (ref 6–23)
CALCIUM: 9.7 mg/dL (ref 8.4–10.5)
CHLORIDE: 88 meq/L — AB (ref 96–112)
CO2: 25 meq/L (ref 19–32)
Creatinine, Ser: 0.61 mg/dL (ref 0.50–1.10)
GLUCOSE: 328 mg/dL — AB (ref 70–99)
Potassium: 3.8 mEq/L (ref 3.7–5.3)
SODIUM: 131 meq/L — AB (ref 137–147)
Total Protein: 7.9 g/dL (ref 6.0–8.3)

## 2013-08-29 LAB — BASIC METABOLIC PANEL WITH GFR
BUN: 17 mg/dL (ref 6–23)
CALCIUM: 9.6 mg/dL (ref 8.4–10.5)
CO2: 22 meq/L (ref 19–32)
CREATININE: 1 mg/dL (ref 0.50–1.10)
Chloride: 91 mEq/L — ABNORMAL LOW (ref 96–112)
GFR, Est African American: 69 mL/min
GFR, Est Non African American: 60 mL/min
Glucose, Bld: 638 mg/dL (ref 70–99)
Potassium: 4.1 mEq/L (ref 3.5–5.3)
Sodium: 129 mEq/L — ABNORMAL LOW (ref 135–145)

## 2013-08-29 LAB — URINE MICROSCOPIC-ADD ON

## 2013-08-29 LAB — INSULIN, FASTING: INSULIN FASTING, SERUM: 27 u[IU]/mL (ref 3–28)

## 2013-08-29 LAB — GLUCOSE, CAPILLARY
GLUCOSE-CAPILLARY: 374 mg/dL — AB (ref 70–99)
Glucose-Capillary: 276 mg/dL — ABNORMAL HIGH (ref 70–99)
Glucose-Capillary: 235 mg/dL — ABNORMAL HIGH (ref 70–99)

## 2013-08-29 LAB — FOLATE RBC: RBC FOLATE: 843 ng/mL (ref >280)

## 2013-08-29 MED ORDER — SODIUM CHLORIDE 0.9 % IV BOLUS (SEPSIS)
1000.0000 mL | Freq: Once | INTRAVENOUS | Status: AC
  Administered 2013-08-29: 1000 mL via INTRAVENOUS

## 2013-08-29 NOTE — ED Notes (Signed)
cbg 374 now

## 2013-08-29 NOTE — Discharge Instructions (Signed)
Please see your doctor tomorrow for repeat glucose.   Hyperglycemia Hyperglycemia occurs when the glucose (sugar) in your blood is too high. Hyperglycemia can happen for many reasons, but it most often happens to people who do not know they have diabetes or are not managing their diabetes properly.  CAUSES  Whether you have diabetes or not, there are other causes of hyperglycemia. Hyperglycemia can occur when you have diabetes, but it can also occur in other situations that you might not be as aware of, such as: Diabetes  If you have diabetes and are having problems controlling your blood glucose, hyperglycemia could occur because of some of the following reasons:  Not following your meal plan.  Not taking your diabetes medications or not taking it properly.  Exercising less or doing less activity than you normally do.  Being sick. Pre-diabetes  This cannot be ignored. Before people develop Type 2 diabetes, they almost always have "pre-diabetes." This is when your blood glucose levels are higher than normal, but not yet high enough to be diagnosed as diabetes. Research has shown that some long-term damage to the body, especially the heart and circulatory system, may already be occurring during pre-diabetes. If you take action to manage your blood glucose when you have pre-diabetes, you may delay or prevent Type 2 diabetes from developing. Stress  If you have diabetes, you may be "diet" controlled or on oral medications or insulin to control your diabetes. However, you may find that your blood glucose is higher than usual in the hospital whether you have diabetes or not. This is often referred to as "stress hyperglycemia." Stress can elevate your blood glucose. This happens because of hormones put out by the body during times of stress. If stress has been the cause of your high blood glucose, it can be followed regularly by your caregiver. That way he/she can make sure your hyperglycemia does  not continue to get worse or progress to diabetes. Steroids  Steroids are medications that act on the infection fighting system (immune system) to block inflammation or infection. One side effect can be a rise in blood glucose. Most people can produce enough extra insulin to allow for this rise, but for those who cannot, steroids make blood glucose levels go even higher. It is not unusual for steroid treatments to "uncover" diabetes that is developing. It is not always possible to determine if the hyperglycemia will go away after the steroids are stopped. A special blood test called an A1c is sometimes done to determine if your blood glucose was elevated before the steroids were started. SYMPTOMS  Thirsty.  Frequent urination.  Dry mouth.  Blurred vision.  Tired or fatigue.  Weakness.  Sleepy.  Tingling in feet or leg. DIAGNOSIS  Diagnosis is made by monitoring blood glucose in one or all of the following ways:  A1c test. This is a chemical found in your blood.  Fingerstick blood glucose monitoring.  Laboratory results. TREATMENT  First, knowing the cause of the hyperglycemia is important before the hyperglycemia can be treated. Treatment may include, but is not be limited to:  Education.  Change or adjustment in medications.  Change or adjustment in meal plan.  Treatment for an illness, infection, etc.  More frequent blood glucose monitoring.  Change in exercise plan.  Decreasing or stopping steroids.  Lifestyle changes. HOME CARE INSTRUCTIONS   Test your blood glucose as directed.  Exercise regularly. Your caregiver will give you instructions about exercise. Pre-diabetes or diabetes which comes  on with stress is helped by exercising.  Eat wholesome, balanced meals. Eat often and at regular, fixed times. Your caregiver or nutritionist will give you a meal plan to guide your sugar intake.  Being at an ideal weight is important. If needed, losing as little as 10  to 15 pounds may help improve blood glucose levels. SEEK MEDICAL CARE IF:   You have questions about medicine, activity, or diet.  You continue to have symptoms (problems such as increased thirst, urination, or weight gain). SEEK IMMEDIATE MEDICAL CARE IF:   You are vomiting or have diarrhea.  Your breath smells fruity.  You are breathing faster or slower.  You are very sleepy or incoherent.  You have numbness, tingling, or pain in your feet or hands.  You have chest pain.  Your symptoms get worse even though you have been following your caregiver's orders.  If you have any other questions or concerns. Document Released: 01/25/2001 Document Revised: 10/24/2011 Document Reviewed: 11/28/2011 [facility] Patient Information 2014 [place], [place].

## 2013-08-29 NOTE — ED Notes (Signed)
PT denies pain, dizziness or fatigue.  Pt denies facial tingling at this time as well.

## 2013-08-29 NOTE — ED Provider Notes (Signed)
CSN: [id number]     Arrival date & time 08/29/13  1218 History   First MD Initiated Contact with Patient 08/29/13 1600     Chief Complaint  Patient presents with  . Hyperglycemia    HPI: [name] is a 63 yo F with history of HTN, CAD, HLD, DM and GERD who presents with hyperglycemia. She has noted intermittent numbness in the right sided of her face and hand for 4-5 days. Occurs every 6-8 hours, will last for 20-30 minutes then resolve. She has also had some mild fatigue for this period of time as well. She was seen by her PCP yesterday, glucose noted to be >600. Repeat today still >600, so she was sent to the ED for evaluation. On arrival she has no complaints. She denies abdominal pain, vomiting, diarrhea, chest pain or SOB. Numbness has not occurred since noon today. Initial BG 374. She is prescribed metformin but does not take it regularly. She does not check her BG regularly either.    Past Medical History  Diagnosis Date  . Hemorrhoids   . Arthritis   . HTN (hypertension)   . Myocardial infarction   . CAD (coronary artery disease)   . Coronary disease     nonobstructive, history of  . Discomfort in chest     musculoskeletal  . Hypercholesterolemia   . Gastric reflux     history of  . Diabetes mellitus without complication    Past Surgical History  Procedure Laterality Date  . Cardiac catheterization  November 25 2004    left heart, minor coronary atherosclerosis  . Angioplasty    . Cholecystectomy    . Cesarean section     Family History  Problem Relation Age of Onset  . Hypertension Mother   . COPD Mother   . Thyroid disease Mother   . Heart attack Father   . Heart disease Father   . Arthritis Sister     Rheumatoid  . Hypertension Sister   . Graves' disease Sister   . Thyroid disease Sister    History  Substance Use Topics  . Smoking status: Never Smoker   . Smokeless tobacco: Not on file  . Alcohol Use: 0.0 oz/week     Comment: occasional   OB History   Grav Para Term Preterm Abortions TAB SAB Ect Mult Living                 Review of Systems  Constitutional: Positive for fatigue. Negative for fever, chills and appetite change.  Eyes: Negative for photophobia, redness and visual disturbance.  Respiratory: Negative for cough, shortness of breath and stridor.   Cardiovascular: Negative for chest pain and leg swelling.  Gastrointestinal: Negative for nausea, vomiting, abdominal pain, diarrhea and constipation.  Genitourinary: Negative for dysuria, frequency, hematuria and decreased urine volume.  Musculoskeletal: Negative for arthralgias, back pain, gait problem and myalgias.  Skin: Negative for color change and wound.  Neurological: Positive for numbness. Negative for dizziness, syncope, light-headedness and headaches.  Psychiatric/Behavioral: Negative for confusion and agitation.  All other systems reviewed and are negative.    Allergies  Review of patient's allergies indicates no known allergies.  Home Medications   Current Outpatient Rx  Name  Route  Sig  Dispense  Refill  . aspirin 81 MG tablet   Oral   Take 81 mg by mouth every morning.          Marland Kitchen atenolol (TENORMIN) 100 MG tablet   Oral   Take  100 mg by mouth every evening.          . Cholecalciferol (VITAMIN D-3) 5000 UNITS TABS   Oral   Take 2 tablets by mouth every other day. 2 on one day 1 on next day etc         . enalapril (VASOTEC) 10 MG tablet   Oral   Take 10 mg by mouth every evening.          Marland Kitchen esomeprazole (NEXIUM) 40 MG capsule   Oral   Take 40 mg by mouth every evening.          . fenofibrate micronized (LOFIBRA) 134 MG capsule   Oral   Take 134 mg by mouth every evening.         . hydrochlorothiazide 25 MG tablet   Oral   Take 25 mg by mouth every morning.          . metFORMIN (GLUCOPHAGE-XR) 500 MG 24 hr tablet   Oral   Take 500 mg by mouth 3 (three) times daily. Takes 1 at breakfast 1 at lunch and 2 at dinner         .  pravastatin (PRAVACHOL) 40 MG tablet   Oral   Take 40 mg by mouth every evening.           BP 99/70  Pulse 104  Temp(Src) 98.3 F (36.8 C) (Oral)  Resp 16  Ht 5\' 4"  (1.626 m)  Wt 215 lb 3.2 oz (97.614 kg)  BMI 36.92 kg/m2  SpO2 98%  Physical Exam  Nursing note and vitals reviewed. Constitutional: She is oriented to person, place, and time. She appears well-developed and well-nourished. No distress.  Middle age female, laying in bed, no distress   HENT:  Head: Normocephalic and atraumatic.  Mouth/Throat: Oropharynx is clear and moist.  Eyes: Conjunctivae and EOM are normal. Pupils are equal, round, and reactive to light.  Neck: Normal range of motion. Neck supple.  Cardiovascular: Regular rhythm, normal heart sounds and intact distal pulses.  Tachycardia present.   Pulmonary/Chest: Effort normal and breath sounds normal. No respiratory distress.  Abdominal: Soft. Bowel sounds are normal. There is no tenderness. There is no rebound and no guarding.  Musculoskeletal: Normal range of motion. She exhibits no edema and no tenderness.  Neurological: She is alert and oriented to person, place, and time. No cranial nerve deficit. Coordination normal. GCS eye subscore is 4. GCS verbal subscore is 5. GCS motor subscore is 6.  CN 3-12 without abnormality. 5/5 strength in all extremities. 2+ DTR's. Normal sensation. Finger to nose and heel to shin normal. Gait normal.   Skin: Skin is warm and dry. No rash noted.  Psychiatric: She has a normal mood and affect. Her behavior is normal.    ED Course  Procedures (including critical care time) Labs Review Labs Reviewed  GLUCOSE, CAPILLARY - Abnormal; Notable for the following:    Glucose-Capillary 374 (*)    All other components within normal limits  CBC - Abnormal; Notable for the following:    RBC 5.32 (*)    Hemoglobin 15.4 (*)    All other components within normal limits  COMPREHENSIVE METABOLIC PANEL - Abnormal; Notable for the  following:    Sodium 131 (*)    Chloride 88 (*)    Glucose, Bld 328 (*)    ALT 52 (*)    All other components within normal limits  URINALYSIS, ROUTINE W REFLEX MICROSCOPIC   Imaging Review No results  found.  EKG Interpretation   None       MDM   1. Hyperglycemia     63 yo F with history of HTN, CAD, HLD, DM and GERD who presents with hyperglycemia. Labs not c/w DKA as CO2 25, glucose 328, AG normal. Does appear mildly hemo concentrated. Will treat with fluids and recheck blood glucose. As for facial numbness, may be related to hyponatremia. Low suspicion for TIA given symptoms have been present for 5 days, intermittent. Doubt ACS as she has not chest pain, SOB, nausea or diaphoresis. If blood glucose improves will speak to her family doctor for close f/u and initiation of insulin therapy.    Glucose down to 235 after two liters of fluid. HR down to 95 as well. Spoke with her PCP, he will see tomorrow in clinic. Felt she was stable for outpatient management as she is not in DKA, labs reassuring, glucose <250. Advised close PCP f/u. Return precautions given.   Reviewed labs and previous medical records, utilized in MDM  Discussed case with Dr. Ashok Cordia  Clinical Impression 1. Hyperglycemia      Louretta Shorten, MD 08/29/13 2130

## 2013-08-29 NOTE — ED Notes (Addendum)
Pt reports shes had numbness and tingling on R side of chin and in R arm for past few days, went to PCP and found her blood sugar was 638 so told her to come to ED for treatment. States she did notice yesterday she was very thirsty and felt like she couldn't drink enough water. Pt had labs drawn at md office this am

## 2013-08-30 ENCOUNTER — Encounter: Payer: Self-pay | Admitting: Emergency Medicine

## 2013-08-30 ENCOUNTER — Ambulatory Visit (INDEPENDENT_AMBULATORY_CARE_PROVIDER_SITE_OTHER): Payer: BC Managed Care – PPO | Admitting: Emergency Medicine

## 2013-08-30 VITALS — BP 118/70 | HR 98 | Temp 98.0°F | Resp 18 | Ht 64.0 in | Wt 217.0 lb

## 2013-08-30 DIAGNOSIS — E876 Hypokalemia: Secondary | ICD-10-CM

## 2013-08-30 DIAGNOSIS — E119 Type 2 diabetes mellitus without complications: Secondary | ICD-10-CM

## 2013-08-30 DIAGNOSIS — R6889 Other general symptoms and signs: Secondary | ICD-10-CM

## 2013-08-30 MED ORDER — CANAGLIFLOZIN 100 MG PO TABS: 100.0000 mg | ORAL_TABLET | Freq: Every day | ORAL | Status: DC

## 2013-08-30 MED ORDER — CANAGLIFLOZIN 300 MG PO TABS: 300.0000 mg | ORAL_TABLET | Freq: Every day | ORAL | Status: DC

## 2013-08-30 MED ORDER — POTASSIUM CHLORIDE CRYS ER 10 MEQ PO TBCR: 10.0000 meq | EXTENDED_RELEASE_TABLET | Freq: Two times a day (BID) | ORAL | Status: DC

## 2013-08-30 NOTE — ED Provider Notes (Signed)
I saw and evaluated the patient, reviewed the resident's note and I agree with the findings and plan.  EKG Interpretation    Date/Time:  Thursday August 29 2013 12:46:25 EST Ventricular Rate:  114 PR Interval:  170 QRS Duration: 80 QT Interval:  322 QTC Calculation: 443 R Axis:   7 Text Interpretation:  Sinus tachycardia Confirmed by Ashok Cordia  MD, Juni Glaab ([id number]) on 08/29/2013 7:24:45 PM            Above ecg interpreted by me.  Pt w hyperglycemia. Labs. Ivf. Pt also notes transient tingling right fingers lasting a 1-2 minutes.   Recheck feels improved. Asymptomatic.   Mirna Mires, MD 08/30/13 1332

## 2013-08-30 NOTE — Patient Instructions (Signed)

## 2013-08-31 LAB — URINE CULTURE

## 2013-09-01 ENCOUNTER — Other Ambulatory Visit: Payer: Self-pay | Admitting: Internal Medicine

## 2013-09-02 NOTE — Progress Notes (Signed)
Subjective:    Patient ID: [name], female    DOB: [birth year], 63 y.o.   MRN: [id number]  HPI Comments: 63 yo noncompliant female DM recently started on MF but would miss doses QD treated at ER for BS >600 and hypokalemia. She notes she is feeling much better. The previous symptoms of tingling have improved somewhat. She is ready to become compliant with DM and RX/ diet changes.  She notes mild neck/ shoulder discomfort without obvious strain/ injury. She denies any unusual triggers. She notes + [facility] of D[facility]. She has not tried any OTC for relief. She notes pain comes and goes but has been more frequent lately. She notes dull ache without radiation.   Current Outpatient Prescriptions on File Prior to Visit  Medication Sig Dispense Refill  . aspirin 81 MG tablet Take 81 mg by mouth every morning.       Marland Kitchen atenolol (TENORMIN) 100 MG tablet Take 100 mg by mouth every evening.       . Cholecalciferol (VITAMIN D-3) 5000 UNITS TABS Take 2 tablets by mouth every other day. 2 on one day 1 on next day etc      . enalapril (VASOTEC) 10 MG tablet Take 10 mg by mouth every evening.       Marland Kitchen esomeprazole (NEXIUM) 40 MG capsule Take 40 mg by mouth every evening.       . fenofibrate micronized (LOFIBRA) 134 MG capsule Take 134 mg by mouth every evening.      . hydrochlorothiazide 25 MG tablet Take 25 mg by mouth every morning.       . metFORMIN (GLUCOPHAGE-XR) 500 MG 24 hr tablet Take 500 mg by mouth 3 (three) times daily. Takes 1 at breakfast 1 at lunch and 2 at dinner      . pravastatin (PRAVACHOL) 40 MG tablet Take 40 mg by mouth every evening.        No current facility-administered medications on file prior to visit.   Review of patient's allergies indicates no known allergies.  Past Medical History  Diagnosis Date  . Hemorrhoids   . Arthritis   . HTN (hypertension)   . Myocardial infarction   . CAD (coronary artery disease)   . Coronary disease     nonobstructive, history of  .  Discomfort in chest     musculoskeletal  . Hypercholesterolemia   . Gastric reflux     history of  . Diabetes mellitus without complication      Review of Systems  Constitutional: Positive for fatigue.  Musculoskeletal: Positive for neck pain.  All other systems reviewed and are negative.   BP 118/70  Pulse 98  Temp(Src) 98 F (36.7 C) (Temporal)  Resp 18  Ht 5\' 4"  (1.626 m)  Wt 217 lb (98.431 kg)  BMI 37.23 kg/m2     Objective:   Physical Exam  Nursing note and vitals reviewed. Constitutional: She is oriented to person, place, and time. She appears well-developed and well-nourished.  HENT:  Head: Normocephalic and atraumatic.  Right Ear: External ear normal.  Left Ear: External ear normal.  Nose: Nose normal.  Mouth/Throat: Oropharynx is clear and moist. No oropharyngeal exudate.  Eyes: Conjunctivae and EOM are normal.  Neck: Normal range of motion.  Cardiovascular: Normal rate, regular rhythm, normal heart sounds and intact distal pulses.   Pulmonary/Chest: Effort normal and breath sounds normal.  Abdominal: Soft. Bowel sounds are normal.  Musculoskeletal: Normal range of motion.  Lymphadenopathy:  She has no cervical adenopathy.  Neurological: She is alert and oriented to person, place, and time. No cranial nerve deficit. Coordination normal.  Skin: Skin is warm and dry.  Psychiatric: She has a normal mood and affect. Judgment normal.          Assessment & Plan:  1. Uncontrolled/ NON compliant DM with recent ER treatment of BS >600- Strong conversation about compliance. Add Invokana 100 mg 1 Qd x 2 weeks then increase to 300 mg qd x 2 weeks,samples given. Will consider change to combo if no SE with new X. Diabetic education material given. W/C if BS >200 2. Recent Hypokalemia corrected at ER will recheck 1 week lab only. w/c if SX increase or ER. Restart Potassium 1 QD 3. Neck strain- Hygiene explained if sx continue needs xray evaluation with [facility] of [facility]

## 2013-09-05 ENCOUNTER — Ambulatory Visit: Payer: Self-pay | Admitting: Physician Assistant

## 2013-09-11 ENCOUNTER — Other Ambulatory Visit: Payer: Self-pay | Admitting: Internal Medicine

## 2013-09-12 ENCOUNTER — Ambulatory Visit (INDEPENDENT_AMBULATORY_CARE_PROVIDER_SITE_OTHER): Payer: BC Managed Care – PPO | Admitting: Emergency Medicine

## 2013-09-12 ENCOUNTER — Encounter: Payer: Self-pay | Admitting: Emergency Medicine

## 2013-09-12 VITALS — BP 116/64 | HR 72 | Temp 98.0°F | Resp 16 | Ht 63.5 in | Wt 218.0 lb

## 2013-09-12 DIAGNOSIS — R6889 Other general symptoms and signs: Secondary | ICD-10-CM

## 2013-09-12 DIAGNOSIS — E119 Type 2 diabetes mellitus without complications: Secondary | ICD-10-CM

## 2013-09-12 DIAGNOSIS — I10 Essential (primary) hypertension: Secondary | ICD-10-CM

## 2013-09-12 NOTE — Patient Instructions (Signed)

## 2013-09-12 NOTE — Progress Notes (Signed)
   Subjective:    Patient ID: [name], female    DOB: March 08, 1951, 63 y.o.   MRN: [id number]  HPI Comments: 63 yo NONcompliant DM has improved compliance with medications after recent ER scare. BS has improved 120s-150s. She is feeling much better. She has been on Invokana 100 over 1 week and has increased to 300 x 3 days. She recently had ER visit with elevated BS above 600 and abnormal NA/ CL levels, she notes tingling of face has corrected. She is improving diet and activity level.  Last labs CREATININE     0.61   08/29/2013 BUN              14   08/29/2013 NA              131   08/29/2013 K               3.8   08/29/2013 CL               88   08/29/2013 CO2              25   08/29/2013   Diabetes Pertinent negatives for diabetes include no polyphagia.      Review of Systems  Endocrine: Negative for polyphagia.  Genitourinary: Positive for frequency.  All other systems reviewed and are negative.   BP 116/64  Pulse 72  Temp(Src) 98 F (36.7 C) (Temporal)  Resp 16  Ht 5' 3.5" (1.613 m)  Wt 218 lb (98.884 kg)  BMI 38.01 kg/m2     Objective:   Physical Exam  Nursing note and vitals reviewed. Constitutional: She is oriented to person, place, and time. She appears well-developed and well-nourished. No distress.  HENT:  Head: Normocephalic and atraumatic.  Right Ear: External ear normal.  Left Ear: External ear normal.  Nose: Nose normal.  Mouth/Throat: Oropharynx is clear and moist.  Eyes: Conjunctivae and EOM are normal.  Neck: Normal range of motion. Neck supple. No JVD present. No thyromegaly present.  Cardiovascular: Normal rate, regular rhythm, normal heart sounds and intact distal pulses.   Pulmonary/Chest: Effort normal and breath sounds normal.  Abdominal: Soft. Bowel sounds are normal. She exhibits no distension and no mass. There is no tenderness. There is no rebound and no guarding.  Musculoskeletal: Normal range of motion. She exhibits no edema and no  tenderness.  Lymphadenopathy:    She has no cervical adenopathy.  Neurological: She is alert and oriented to person, place, and time. No cranial nerve deficit.  Skin: Skin is warm and dry. No rash noted. No erythema. No pallor.  Psychiatric: She has a normal mood and affect. Her behavior is normal. Judgment and thought content normal.          Assessment & Plan:  1. DM with recent ER visit with NONcompliance with RX has improved compliance needs recheck of labs. Continue improving diet/ weight loss and exercise. SX Invokana 300 #25 given use AD. 2. Abnormal labs- recheck

## 2013-09-13 DIAGNOSIS — E119 Type 2 diabetes mellitus without complications: Secondary | ICD-10-CM | POA: Insufficient documentation

## 2013-09-13 DIAGNOSIS — E1129 Type 2 diabetes mellitus with other diabetic kidney complication: Secondary | ICD-10-CM | POA: Insufficient documentation

## 2013-09-13 LAB — HEPATIC FUNCTION PANEL
ALK PHOS: 54 U/L (ref 39–117)
ALT: 46 U/L — ABNORMAL HIGH (ref 0–35)
AST: 31 U/L (ref 0–37)
Albumin: 4.5 g/dL (ref 3.5–5.2)
Bilirubin, Direct: 0.1 mg/dL (ref 0.0–0.3)
Indirect Bilirubin: 0.5 mg/dL (ref 0.2–1.2)
TOTAL PROTEIN: 7 g/dL (ref 6.0–8.3)
Total Bilirubin: 0.6 mg/dL (ref 0.2–1.2)

## 2013-09-13 LAB — BASIC METABOLIC PANEL WITH GFR
BUN: 20 mg/dL (ref 6–23)
CHLORIDE: 101 meq/L (ref 96–112)
CO2: 24 mEq/L (ref 19–32)
CREATININE: 0.82 mg/dL (ref 0.50–1.10)
Calcium: 10 mg/dL (ref 8.4–10.5)
GFR, EST NON AFRICAN AMERICAN: 76 mL/min
GFR, Est African American: 88 mL/min
Glucose, Bld: 118 mg/dL — ABNORMAL HIGH (ref 70–99)
Potassium: 4.5 mEq/L (ref 3.5–5.3)
Sodium: 135 mEq/L (ref 135–145)

## 2013-09-17 ENCOUNTER — Encounter: Payer: Self-pay | Admitting: Emergency Medicine

## 2013-09-17 ENCOUNTER — Telehealth: Payer: Self-pay | Admitting: Internal Medicine

## 2013-09-17 ENCOUNTER — Other Ambulatory Visit: Payer: Self-pay | Admitting: Emergency Medicine

## 2013-09-17 DIAGNOSIS — E119 Type 2 diabetes mellitus without complications: Secondary | ICD-10-CM

## 2013-09-17 MED ORDER — GLUCOSE BLOOD VI STRP: ORAL_STRIP | Status: DC

## 2013-09-17 NOTE — Telephone Encounter (Signed)
PATIENT REQUESTED SAMPLES OF FREE STYLE LITE TEST STRIPS GAVE BOX OF 10.  WILL NEED RX SENT TO PHARM ALSO

## 2013-09-17 NOTE — Telephone Encounter (Signed)
FREESTYLE LITE TEST STRIPES 2TIMES DAILY TO PHARM- [facility], [place].  GAVE PT SAMPLE OF 10 STRIPES TODAY.

## 2013-10-02 ENCOUNTER — Other Ambulatory Visit: Payer: Self-pay | Admitting: Internal Medicine

## 2013-10-14 ENCOUNTER — Other Ambulatory Visit: Payer: Self-pay | Admitting: *Deleted

## 2013-10-14 DIAGNOSIS — E119 Type 2 diabetes mellitus without complications: Secondary | ICD-10-CM

## 2013-10-14 MED ORDER — CANAGLIFLOZIN 300 MG PO TABS: 300.0000 mg | ORAL_TABLET | Freq: Every day | ORAL | Status: DC

## 2013-11-06 ENCOUNTER — Other Ambulatory Visit: Payer: Self-pay | Admitting: Internal Medicine

## 2013-11-06 ENCOUNTER — Telehealth: Payer: Self-pay | Admitting: Internal Medicine

## 2013-11-06 DIAGNOSIS — E119 Type 2 diabetes mellitus without complications: Secondary | ICD-10-CM

## 2013-11-06 MED ORDER — FREESTYLE LANCETS MISC
Status: DC
Start: 2013-11-06 — End: 2014-02-18

## 2013-11-06 MED ORDER — CANAGLIFLOZIN 300 MG PO TABS: 300.0000 mg | ORAL_TABLET | Freq: Every day | ORAL | Status: DC

## 2013-11-06 NOTE — Telephone Encounter (Signed)
PT NEEDS NEW RX FOR LANCETS FOR THE FREE STYLE LITE.  [facility] DOES NOT HAVE RX. [place].  ALSO MELISSA HAS BEEN GIVING PT SAMPLE OF INVOKANA 300MG , NEEDS ANOTHER 30 DAYS OF SAMPLES OR RX TO [facility]OVE PHARM. PLEASE ADVISE PT.  THANKS KAT

## 2013-11-06 NOTE — Telephone Encounter (Signed)
Please let Omaya know Rx for Invokana an Freestyle lancets sent to drug store

## 2013-11-11 ENCOUNTER — Other Ambulatory Visit: Payer: Self-pay | Admitting: Physician Assistant

## 2013-11-11 DIAGNOSIS — E119 Type 2 diabetes mellitus without complications: Secondary | ICD-10-CM

## 2013-11-11 MED ORDER — CANAGLIFLOZIN 300 MG PO TABS
300.0000 mg | ORAL_TABLET | Freq: Every day | ORAL | Status: DC
Start: 2013-11-11 — End: 2014-02-18

## 2013-11-21 ENCOUNTER — Other Ambulatory Visit: Payer: Self-pay | Admitting: Internal Medicine

## 2013-11-21 ENCOUNTER — Other Ambulatory Visit: Payer: Self-pay | Admitting: Emergency Medicine

## 2013-12-08 ENCOUNTER — Encounter: Payer: Self-pay | Admitting: Internal Medicine

## 2013-12-08 DIAGNOSIS — E559 Vitamin D deficiency, unspecified: Secondary | ICD-10-CM | POA: Insufficient documentation

## 2013-12-08 DIAGNOSIS — E1169 Type 2 diabetes mellitus with other specified complication: Secondary | ICD-10-CM | POA: Insufficient documentation

## 2013-12-08 DIAGNOSIS — Z79899 Other long term (current) drug therapy: Secondary | ICD-10-CM | POA: Insufficient documentation

## 2013-12-08 DIAGNOSIS — E782 Mixed hyperlipidemia: Secondary | ICD-10-CM

## 2013-12-08 NOTE — Progress Notes (Signed)
Patient ID: [name], female   DOB: 1950/09/04, 63 y.o.   MRN: [id number]    This very nice 63 y.o. DWF presents for 3 month follow up with Hypertension, Hyperlipidemia, Pre-Diabetes and Vitamin D Deficiency.    HTN predates since 57. BP has been controlled at home. Today's BP: 116/70 mmHg . Patient denies any cardiac type chest pain, palpitations, dyspnea/orthopnea/PND, dizziness, claudication, or dependent edema.   Hyperlipidemia is controlled with diet & meds. Last Cholesterol was  252, Triglycerides were 390, HDL 51 and LDL 123 in Jan 2015. Patient denies myalgias or other med SE's.    Also, the patient has history of T2 NIDDM w/ Stage 2 CKD since  2011 and she did finally start Metformin and in Oct 2014 her A1c was 10.4% and she had Invokana added to her regimen in Jan 2015 when her last A1c was  12.4 in Jan 2015 . Since then she has lost 33 # and reports FBG's are ranging 90-100 mg%. Patient denies any symptoms of reactive hypoglycemia, diabetic polys, paresthesias or visual blurring.   Further, Patient has history of Vitamin D Deficiency of 20 in 2008 with last vitamin D of 72. Patient supplements vitamin D without any suspected side-effects.  Current Outpatient Prescriptions on File Prior to Visit  Medication Sig Dispense Refill  . aspirin 81 MG tablet Take 81 mg by mouth every morning.       Marland Kitchen atenolol (TENORMIN) 100 MG tablet TAKE 1 TABLET BY MOUTH EVERY DAY FOR BLOOD PRESSURE  100 tablet  1  . Canagliflozin (INVOKANA) 300 MG TABS Take 1 tablet (300 mg total) by mouth daily.  30 tablet  3  . Cholecalciferol (VITAMIN D-3) 5000 UNITS TABS Take 2 tablets by mouth every other day. 2 on one day 1 on next day etc      . enalapril (VASOTEC) 10 MG tablet Take 10 mg by mouth every evening.       Marland Kitchen esomeprazole (NEXIUM) 40 MG capsule Take 40 mg by mouth every evening.       . fenofibrate micronized (LOFIBRA) 134 MG capsule TAKE 1 CAPSULE BY MOUTH EVERY DAY BEFORE BREAKFAST  30 capsule  6   . glucose blood (FREESTYLE LITE) test strip Use as instructed  100 each  12  . hydrochlorothiazide (HYDRODIURIL) 25 MG tablet TAKE 1 TABLET BY MOUTH EVERY MORNING FOR FLUID  90 tablet  1  . Lancets (FREESTYLE) lancets Use as instructed  100 each  12  . metFORMIN (GLUCOPHAGE-XR) 500 MG 24 hr tablet Take 500 mg by mouth 3 (three) times daily. Takes 1 at breakfast 1 at lunch and 2 at dinner      . potassium chloride (K-DUR,KLOR-CON) 10 MEQ tablet TAKE 1 TABLET BY MOUTH TWICE DAILY  60 tablet  6  . pravastatin (PRAVACHOL) 40 MG tablet Take 40 mg by mouth every evening.        No current facility-administered medications on file prior to visit.     No Known Allergies  PMHx:   Past Medical History  Diagnosis Date  . Hemorrhoids   . Arthritis   . HTN (hypertension)   . Myocardial infarction   . CAD (coronary artery disease)   . Coronary disease     nonobstructive, history of  . Discomfort in chest     musculoskeletal  . Hypercholesterolemia   . Gastric reflux     history of  . Diabetes mellitus without complication    FHx:  Reviewed / unchanged  SHx:    Reviewed / unchanged   Systems Review: Constitutional: Denies fever, chills, wt changes, headaches, insomnia, fatigue, night sweats, change in appetite. Eyes: Denies redness, blurred vision, diplopia, discharge, itchy, watery eyes.  ENT: Denies discharge, congestion, post nasal drip, epistaxis, sore throat, earache, hearing loss, dental pain, tinnitus, vertigo, sinus pain, snoring.  CV: Denies chest pain, palpitations, irregular heartbeat, syncope, dyspnea, diaphoresis, orthopnea, PND, claudication, edema. Respiratory: denies cough, dyspnea, DOE, pleurisy, hoarseness, laryngitis, wheezing.  Gastrointestinal: Denies dysphagia, odynophagia, heartburn, reflux, water brash, abdominal pain or cramps, nausea, vomiting, bloating, diarrhea, constipation, hematemesis, melena, hematochezia,  or hemorrhoids. Genitourinary: Denies dysuria,  frequency, urgency, nocturia, hesitancy, discharge, hematuria, flank pain. Musculoskeletal: Denies arthralgias, myalgias, stiffness, jt. swelling, pain, limp, strain/sprain.  Skin: Denies pruritus, rash, hives, warts, acne, eczema, change in skin lesion(s). Neuro: No weakness, tremor, incoordination, spasms, paresthesia, or pain. Psychiatric: Denies confusion, memory loss, or sensory loss. Endo: Denies change in weight, skin, hair change.  Heme/Lymph: No excessive bleeding, bruising, orenlarged lymph nodes.   Exam:  BP 116/70  Pulse 76  Temp(Src) 97.7 F (36.5 C) (Temporal)  Resp 18  Ht 5\' 4"  (1.626 m)  Wt 197 lb 6.4 oz (89.54 kg)  BMI 33.87 kg/m2  Appears well nourished - in no distress. Eyes: PERRLA, EOMs, conjunctiva no swelling or erythema. Sinuses: No frontal/maxillary tenderness ENT/Mouth: EAC's clear, TM's nl w/o erythema, bulging. Nares clear w/o erythema, swelling, exudates. Oropharynx clear without erythema or exudates. Oral hygiene is good. Tongue normal, non obstructing. Hearing intact.  Neck: Supple. Thyroid nl. Car 2+/2+ without bruits, nodes or JVD. Chest: Respirations nl with BS clear & equal w/o rales, rhonchi, wheezing or stridor.  Cor: Heart sounds normal w/ regular rate and rhythm without sig. murmurs, gallops, clicks, or rubs. Peripheral pulses normal and equal  without edema.  Musculoskeletal: Full ROM all peripheral extremities, joint stability, 5/5 strength, and normal gait.  Skin: Warm, dry without exposed rashes, lesions, ecchymosis apparent.  Neuro: Cranial nerves intact, reflexes equal bilaterally. Sensory-motor testing grossly intact. Tendon reflexes grossly intact.  Pysch: Alert & oriented x 3. Insight and judgement nl & appropriate. No ideations.  Assessment and Plan:  1. Hypertension - Continue monitor blood pressure at home. Continue diet/meds same.  2. Hyperlipidemia - Continue diet/meds, exercise,& lifestyle modifications. Continue monitor  periodic cholesterol/liver & renal functions   3.T2 NIDDM w/Stage 2 CKD - Continue diet, exercise, lifestyle modifications. Monitor appropriate labs.  4. Vitamin D Deficiency - Continue supplementation.  Recommended regular exercise, BP monitoring, weight control, and discussed med and SE's. Recommended labs to assess and monitor clinical status. Further disposition pending results of labs.

## 2013-12-08 NOTE — Patient Instructions (Signed)

## 2013-12-09 ENCOUNTER — Ambulatory Visit (INDEPENDENT_AMBULATORY_CARE_PROVIDER_SITE_OTHER): Payer: BC Managed Care – PPO | Admitting: Internal Medicine

## 2013-12-09 VITALS — BP 116/70 | HR 76 | Temp 97.7°F | Resp 18 | Ht 64.0 in | Wt 197.4 lb

## 2013-12-09 DIAGNOSIS — E1129 Type 2 diabetes mellitus with other diabetic kidney complication: Secondary | ICD-10-CM

## 2013-12-09 DIAGNOSIS — I10 Essential (primary) hypertension: Secondary | ICD-10-CM

## 2013-12-09 DIAGNOSIS — E559 Vitamin D deficiency, unspecified: Secondary | ICD-10-CM

## 2013-12-09 DIAGNOSIS — Z79899 Other long term (current) drug therapy: Secondary | ICD-10-CM

## 2013-12-09 DIAGNOSIS — E782 Mixed hyperlipidemia: Secondary | ICD-10-CM

## 2013-12-09 LAB — VITAMIN D 25 HYDROXY (VIT D DEFICIENCY, FRACTURES): Vit D, 25-Hydroxy: 90 ng/mL — ABNORMAL HIGH (ref 30–89)

## 2013-12-09 LAB — CBC WITH DIFFERENTIAL/PLATELET
BASOS ABS: 0.1 10*3/uL (ref 0.0–0.1)
Basophils Relative: 1 % (ref 0–1)
Eosinophils Absolute: 0.2 10*3/uL (ref 0.0–0.7)
Eosinophils Relative: 3 % (ref 0–5)
HCT: 37.8 % (ref 36.0–46.0)
HEMOGLOBIN: 13 g/dL (ref 12.0–15.0)
Lymphocytes Relative: 23 % (ref 12–46)
Lymphs Abs: 1.7 10*3/uL (ref 0.7–4.0)
MCH: 28 pg (ref 26.0–34.0)
MCHC: 34.4 g/dL (ref 30.0–36.0)
MCV: 81.3 fL (ref 78.0–100.0)
Monocytes Absolute: 0.5 10*3/uL (ref 0.1–1.0)
Monocytes Relative: 7 % (ref 3–12)
NEUTROS ABS: 4.8 10*3/uL (ref 1.7–7.7)
NEUTROS PCT: 66 % (ref 43–77)
Platelets: 311 10*3/uL (ref 150–400)
RBC: 4.65 MIL/uL (ref 3.87–5.11)
RDW: 13.8 % (ref 11.5–15.5)
WBC: 7.3 10*3/uL (ref 4.0–10.5)

## 2013-12-09 LAB — TSH: TSH: 1.92 u[IU]/mL (ref 0.350–4.500)

## 2013-12-09 LAB — HEPATIC FUNCTION PANEL
ALT: 30 U/L (ref 0–35)
AST: 20 U/L (ref 0–37)
Albumin: 4.4 g/dL (ref 3.5–5.2)
Alkaline Phosphatase: 49 U/L (ref 39–117)
BILIRUBIN DIRECT: 0.1 mg/dL (ref 0.0–0.3)
BILIRUBIN INDIRECT: 0.4 mg/dL (ref 0.2–1.2)
BILIRUBIN TOTAL: 0.5 mg/dL (ref 0.2–1.2)
Total Protein: 6.7 g/dL (ref 6.0–8.3)

## 2013-12-09 LAB — BASIC METABOLIC PANEL WITH GFR
BUN: 18 mg/dL (ref 6–23)
CO2: 24 mEq/L (ref 19–32)
Calcium: 9.8 mg/dL (ref 8.4–10.5)
Chloride: 102 mEq/L (ref 96–112)
Creat: 0.75 mg/dL (ref 0.50–1.10)
GFR, EST NON AFRICAN AMERICAN: 85 mL/min
Glucose, Bld: 98 mg/dL (ref 70–99)
POTASSIUM: 4.3 meq/L (ref 3.5–5.3)
SODIUM: 137 meq/L (ref 135–145)

## 2013-12-09 LAB — LIPID PANEL
CHOL/HDL RATIO: 2.8 ratio
CHOLESTEROL: 155 mg/dL (ref 0–200)
HDL: 55 mg/dL (ref >39)
LDL Cholesterol: 71 mg/dL (ref 0–99)
TRIGLYCERIDES: 143 mg/dL (ref <150)
VLDL: 29 mg/dL (ref 0–40)

## 2013-12-09 LAB — HEMOGLOBIN A1C
Hgb A1c MFr Bld: 5.7 % — ABNORMAL HIGH (ref <5.7)
MEAN PLASMA GLUCOSE: 117 mg/dL — AB (ref <117)

## 2013-12-09 LAB — MAGNESIUM: Magnesium: 1.9 mg/dL (ref 1.5–2.5)

## 2013-12-10 LAB — INSULIN, FASTING: Insulin fasting, serum: 24 u[IU]/mL (ref 3–28)

## 2013-12-11 ENCOUNTER — Other Ambulatory Visit: Payer: Self-pay | Admitting: Internal Medicine

## 2014-02-18 ENCOUNTER — Encounter (HOSPITAL_COMMUNITY): Payer: Self-pay | Admitting: Pharmacy Technician

## 2014-02-18 NOTE — Progress Notes (Signed)
Dr Karren Burly-   NEED PRE OP ORDERS PLEASE-  SHE HAS PST [facility]

## 2014-02-19 ENCOUNTER — Other Ambulatory Visit (HOSPITAL_COMMUNITY): Payer: Self-pay | Admitting: Anesthesiology

## 2014-02-19 NOTE — Progress Notes (Signed)
Need orders in epic asap pre op 02-21-14 100 pm

## 2014-02-19 NOTE — Progress Notes (Signed)
ekg 08-29-13 epic

## 2014-02-20 ENCOUNTER — Other Ambulatory Visit: Payer: Self-pay | Admitting: Surgical

## 2014-02-21 ENCOUNTER — Ambulatory Visit (HOSPITAL_COMMUNITY)
Admission: RE | Admit: 2014-02-21 | Discharge: 2014-02-21 | Disposition: A | Discharge status: Home / Self Care | Payer: BC Managed Care – PPO | Source: Ambulatory Visit | Attending: Anesthesiology | Admitting: Anesthesiology

## 2014-02-21 ENCOUNTER — Encounter (HOSPITAL_COMMUNITY): Payer: Self-pay

## 2014-02-21 ENCOUNTER — Encounter (HOSPITAL_COMMUNITY)
Admission: RE | Admit: 2014-02-21 | Discharge: 2014-02-21 | Disposition: A | Discharge status: Home / Self Care | Payer: BC Managed Care – PPO | Source: Ambulatory Visit | Attending: Orthopedic Surgery | Admitting: Orthopedic Surgery

## 2014-02-21 DIAGNOSIS — I10 Essential (primary) hypertension: Secondary | ICD-10-CM | POA: Insufficient documentation

## 2014-02-21 LAB — BASIC METABOLIC PANEL
ANION GAP: 13 (ref 5–15)
BUN: 23 mg/dL (ref 6–23)
CHLORIDE: 97 meq/L (ref 96–112)
CO2: 28 mEq/L (ref 19–32)
Calcium: 10.7 mg/dL — ABNORMAL HIGH (ref 8.4–10.5)
Creatinine, Ser: 0.77 mg/dL (ref 0.50–1.10)
GFR calc non Af Amer: 88 mL/min — ABNORMAL LOW (ref >90)
Glucose, Bld: 92 mg/dL (ref 70–99)
POTASSIUM: 4.7 meq/L (ref 3.7–5.3)
Sodium: 138 mEq/L (ref 137–147)

## 2014-02-21 LAB — CBC
HCT: 44.1 % (ref 36.0–46.0)
Hemoglobin: 14.4 g/dL (ref 12.0–15.0)
MCH: 27.6 pg (ref 26.0–34.0)
MCHC: 32.7 g/dL (ref 30.0–36.0)
MCV: 84.6 fL (ref 78.0–100.0)
Platelets: 310 10*3/uL (ref 150–400)
RBC: 5.21 MIL/uL — ABNORMAL HIGH (ref 3.87–5.11)
RDW: 13.6 % (ref 11.5–15.5)
WBC: 10.2 10*3/uL (ref 4.0–10.5)

## 2014-02-21 NOTE — Patient Instructions (Addendum)
[name]  02/21/2014   Your procedure is scheduled on: Tuesday July 14th, 2015  Report to [facility] Main Entrance and follow signs to  [place] at 1115 AM.  Call this number if you have problems the morning of surgery [phone]   Remember:  Do not eat food :After Midnight.               Clear liquids midnight until 715 am day of surgery, then nothing by mouth.    CLEAR LIQUID DIET   Foods Allowed                                                                     Foods Excluded  Coffee and tea, regular and decaf                             liquids that you cannot  Plain Jell-O in any flavor                                             see through such as: Fruit ices (not with fruit pulp)                                     milk, soups, orange juice  Iced Popsicles                                    All solid food Carbonated beverages, regular and diet                                    Cranberry, grape and apple juices Sports drinks like Gatorade Lightly seasoned clear broth or consume(fat free) Sugar, honey syrup  Sample Menu Breakfast                                Lunch                                     Supper Cranberry juice                    Beef broth                            Chicken broth Jell-O                                     Grape juice                           Apple juice Coffee or tea  Jell-O                                      Popsicle                                                Coffee or tea                        Coffee or tea  _____________________________________________________________________    Take these medicines the morning of surgery with A SIP OF WATER: no meds to take                               You may not have any metal on your body including hair pins and piercings  Do not wear jewelry, make-up, lotions, powders, or deodorant.   Men may shave face and neck.  Do not bring  valuables to the hospital. [place].  Contacts, dentures or bridgework may not be worn into surgery.  Leave suitcase in the car. After surgery it may be brought to your room.  For patients admitted to the hospital, checkout time is 11:00 AM the day of discharge.   Patients discharged the day of surgery will not be allowed to drive home.  Name and phone number of your driver: son [name] [phone]  Special Instructions: N/A ________________________________________________________________________  [facility] - Preparing for Surgery Before surgery, you can play an important role.  Because skin is not sterile, your skin needs to be as free of germs as possible.  You can reduce the number of germs on your skin by washing with CHG (chlorahexidine gluconate) soap before surgery.  CHG is an antiseptic cleaner which kills germs and bonds with the skin to continue killing germs even after washing. Please DO NOT use if you have an allergy to CHG or antibacterial soaps.  If your skin becomes reddened/irritated stop using the CHG and inform your nurse when you arrive at Short Stay. Do not shave (including legs and underarms) for at least 48 hours prior to the first CHG shower.  You may shave your face/neck. Please follow these instructions carefully:  1.  Shower with CHG Soap the night before surgery and the  morning of Surgery.  2.  If you choose to wash your hair, wash your hair first as usual with your  normal  shampoo.  3.  After you shampoo, rinse your hair and body thoroughly to remove the  shampoo.                           4.  Use CHG as you would any other liquid soap.  You can apply chg directly  to the skin and wash                       Gently with a scrungie or clean washcloth.  5.  Apply the CHG Soap to your body ONLY FROM THE NECK DOWN.   Do not use on face/ open  Wound or open sores. Avoid contact with eyes, ears mouth and  genitals (private parts).                       Wash face,  Genitals (private parts) with your normal soap.             6.  Wash thoroughly, paying special attention to the area where your surgery  will be performed.  7.  Thoroughly rinse your body with warm water from the neck down.  8.  DO NOT shower/wash with your normal soap after using and rinsing off  the CHG Soap.                9.  Pat yourself dry with a clean towel.            10.  Wear clean pajamas.            11.  Place clean sheets on your bed the night of your first shower and do not  sleep with pets. Day of Surgery : Do not apply any lotions/deodorants the morning of surgery.  Please wear clean clothes to the hospital/surgery center.  FAILURE TO FOLLOW THESE INSTRUCTIONS MAY RESULT IN THE CANCELLATION OF YOUR SURGERY PATIENT SIGNATURE_________________________________  NURSE SIGNATURE__________________________________  ________________________________________________________________________

## 2014-02-25 ENCOUNTER — Encounter (HOSPITAL_COMMUNITY): Payer: BC Managed Care – PPO | Admitting: Anesthesiology

## 2014-02-25 ENCOUNTER — Ambulatory Visit (HOSPITAL_COMMUNITY)
Admission: RE | Admit: 2014-02-25 | Discharge: 2014-02-25 | Disposition: A | Discharge status: Home / Self Care | Payer: BC Managed Care – PPO | Source: Ambulatory Visit | Attending: Orthopedic Surgery | Admitting: Orthopedic Surgery

## 2014-02-25 ENCOUNTER — Encounter (HOSPITAL_COMMUNITY)
Admission: RE | Disposition: A | Discharge status: Home / Self Care | Payer: Self-pay | Source: Ambulatory Visit | Attending: Orthopedic Surgery

## 2014-02-25 ENCOUNTER — Encounter (HOSPITAL_COMMUNITY): Payer: Self-pay | Admitting: *Deleted

## 2014-02-25 ENCOUNTER — Ambulatory Visit (HOSPITAL_COMMUNITY): Payer: BC Managed Care – PPO | Admitting: Anesthesiology

## 2014-02-25 DIAGNOSIS — E7800 Pure hypercholesterolemia, unspecified: Secondary | ICD-10-CM | POA: Diagnosis not present

## 2014-02-25 DIAGNOSIS — Z7982 Long term (current) use of aspirin: Secondary | ICD-10-CM | POA: Insufficient documentation

## 2014-02-25 DIAGNOSIS — I2510 Atherosclerotic heart disease of native coronary artery without angina pectoris: Secondary | ICD-10-CM | POA: Diagnosis not present

## 2014-02-25 DIAGNOSIS — I252 Old myocardial infarction: Secondary | ICD-10-CM | POA: Insufficient documentation

## 2014-02-25 DIAGNOSIS — M6580 Other synovitis and tenosynovitis, unspecified site: Secondary | ICD-10-CM | POA: Insufficient documentation

## 2014-02-25 DIAGNOSIS — E119 Type 2 diabetes mellitus without complications: Secondary | ICD-10-CM | POA: Insufficient documentation

## 2014-02-25 DIAGNOSIS — M1712 Unilateral primary osteoarthritis, left knee: Secondary | ICD-10-CM | POA: Diagnosis present

## 2014-02-25 DIAGNOSIS — M23302 Other meniscus derangements, unspecified lateral meniscus, unspecified knee: Secondary | ICD-10-CM | POA: Insufficient documentation

## 2014-02-25 DIAGNOSIS — M1710 Unilateral primary osteoarthritis, unspecified knee: Secondary | ICD-10-CM | POA: Diagnosis not present

## 2014-02-25 DIAGNOSIS — I10 Essential (primary) hypertension: Secondary | ICD-10-CM | POA: Insufficient documentation

## 2014-02-25 DIAGNOSIS — Z79899 Other long term (current) drug therapy: Secondary | ICD-10-CM | POA: Diagnosis not present

## 2014-02-25 DIAGNOSIS — S83242A Other tear of medial meniscus, current injury, left knee, initial encounter: Secondary | ICD-10-CM

## 2014-02-25 DIAGNOSIS — M23329 Other meniscus derangements, posterior horn of medial meniscus, unspecified knee: Secondary | ICD-10-CM | POA: Insufficient documentation

## 2014-02-25 HISTORY — PX: KNEE ARTHROSCOPY WITH MEDIAL MENISECTOMY: SHX5651

## 2014-02-25 LAB — GLUCOSE, CAPILLARY
Glucose-Capillary: 101 mg/dL — ABNORMAL HIGH (ref 70–99)
Glucose-Capillary: 78 mg/dL (ref 70–99)

## 2014-02-25 SURGERY — ARTHROSCOPY, KNEE, WITH MEDIAL MENISCECTOMY
Anesthesia: General | Site: Knee | Laterality: Left

## 2014-02-25 MED ORDER — MEPERIDINE HCL 50 MG/ML IJ SOLN: 6.2500 mg | INTRAMUSCULAR | Status: DC | PRN

## 2014-02-25 MED ORDER — PROMETHAZINE HCL 25 MG/ML IJ SOLN: 6.2500 mg | INTRAMUSCULAR | Status: DC | PRN

## 2014-02-25 MED ORDER — BUPIVACAINE-EPINEPHRINE (PF) 0.25% -1:200000 IJ SOLN
INTRAMUSCULAR | Status: AC
  Filled 2014-02-25: qty 30

## 2014-02-25 MED ORDER — ONDANSETRON HCL 4 MG/2ML IJ SOLN
INTRAMUSCULAR | Status: DC | PRN
  Administered 2014-02-25: 4 mg via INTRAVENOUS

## 2014-02-25 MED ORDER — PROPOFOL 10 MG/ML IV BOLUS
INTRAVENOUS | Status: DC | PRN
  Administered 2014-02-25: 180 mg via INTRAVENOUS

## 2014-02-25 MED ORDER — BACITRACIN ZINC 500 UNIT/GM EX OINT
TOPICAL_OINTMENT | CUTANEOUS | Status: DC | PRN
  Administered 2014-02-25: 1 via TOPICAL

## 2014-02-25 MED ORDER — MIDAZOLAM HCL 5 MG/5ML IJ SOLN
INTRAMUSCULAR | Status: DC | PRN
  Administered 2014-02-25: 2 mg via INTRAVENOUS

## 2014-02-25 MED ORDER — ONDANSETRON HCL 4 MG/2ML IJ SOLN
INTRAMUSCULAR | Status: AC
  Filled 2014-02-25: qty 2

## 2014-02-25 MED ORDER — LACTATED RINGERS IV SOLN
INTRAVENOUS | Status: DC
  Administered 2014-02-25: 16:00:00 via INTRAVENOUS

## 2014-02-25 MED ORDER — FENTANYL CITRATE 0.05 MG/ML IJ SOLN
INTRAMUSCULAR | Status: DC | PRN
  Administered 2014-02-25 (×5): 50 ug via INTRAVENOUS

## 2014-02-25 MED ORDER — LIDOCAINE HCL 1 % IJ SOLN
INTRAMUSCULAR | Status: DC | PRN
  Administered 2014-02-25: 100 mg via INTRADERMAL

## 2014-02-25 MED ORDER — LACTATED RINGERS IR SOLN
Status: DC | PRN
  Administered 2014-02-25: 6000 mL

## 2014-02-25 MED ORDER — FENTANYL CITRATE 0.05 MG/ML IJ SOLN: 25.0000 ug | INTRAMUSCULAR | Status: DC | PRN

## 2014-02-25 MED ORDER — PROPOFOL 10 MG/ML IV BOLUS
INTRAVENOUS | Status: AC
  Filled 2014-02-25: qty 20

## 2014-02-25 MED ORDER — LIDOCAINE HCL (CARDIAC) 20 MG/ML IV SOLN
INTRAVENOUS | Status: AC
  Filled 2014-02-25: qty 5

## 2014-02-25 MED ORDER — MIDAZOLAM HCL 2 MG/2ML IJ SOLN
INTRAMUSCULAR | Status: AC
  Filled 2014-02-25: qty 2

## 2014-02-25 MED ORDER — OXYCODONE-ACETAMINOPHEN 10-325 MG PO TABS: 1.0000 | ORAL_TABLET | ORAL | Status: DC | PRN

## 2014-02-25 MED ORDER — ATROPINE SULFATE 0.4 MG/ML IJ SOLN
INTRAMUSCULAR | Status: DC | PRN
  Administered 2014-02-25: 0.4 mg via INTRAVENOUS

## 2014-02-25 MED ORDER — FENTANYL CITRATE 0.05 MG/ML IJ SOLN
INTRAMUSCULAR | Status: AC
  Filled 2014-02-25: qty 5

## 2014-02-25 MED ORDER — LACTATED RINGERS IV SOLN
INTRAVENOUS | Status: DC
  Administered 2014-02-25: 1000 mL via INTRAVENOUS

## 2014-02-25 MED ORDER — CEFAZOLIN SODIUM-DEXTROSE 2-3 GM-% IV SOLR
2.0000 g | INTRAVENOUS | Status: AC
  Administered 2014-02-25: 2 g via INTRAVENOUS

## 2014-02-25 MED ORDER — EPHEDRINE SULFATE 50 MG/ML IJ SOLN
INTRAMUSCULAR | Status: DC | PRN
  Administered 2014-02-25: 5 mg via INTRAVENOUS

## 2014-02-25 MED ORDER — CEFAZOLIN SODIUM-DEXTROSE 2-3 GM-% IV SOLR
INTRAVENOUS | Status: AC
  Filled 2014-02-25: qty 50

## 2014-02-25 MED ORDER — BACITRACIN ZINC 500 UNIT/GM EX OINT
TOPICAL_OINTMENT | CUTANEOUS | Status: AC
  Filled 2014-02-25: qty 28.35

## 2014-02-25 MED ORDER — BUPIVACAINE-EPINEPHRINE 0.25% -1:200000 IJ SOLN
INTRAMUSCULAR | Status: DC | PRN
  Administered 2014-02-25: 30 mL

## 2014-02-25 SURGICAL SUPPLY — 27 items
BANDAGE ELASTIC 4 VELCRO ST LF (GAUZE/BANDAGES/DRESSINGS) ×2 IMPLANT
BANDAGE ELASTIC 6 VELCRO ST LF (GAUZE/BANDAGES/DRESSINGS) ×2 IMPLANT
BLADE GREAT WHITE 4.2 (BLADE) ×2 IMPLANT
BNDG COHESIVE 6X5 TAN STRL LF (GAUZE/BANDAGES/DRESSINGS) ×2 IMPLANT
CLOTH BEACON ORANGE TIMEOUT ST (SAFETY) ×2 IMPLANT
COUNTER NEEDLE 20 DBL MAG RED (NEEDLE) ×2 IMPLANT
DRAPE LG THREE QUARTER DISP (DRAPES) ×2 IMPLANT
DRSG ADAPTIC 3X8 NADH LF (GAUZE/BANDAGES/DRESSINGS) ×2 IMPLANT
DRSG PAD ABDOMINAL 8X10 ST (GAUZE/BANDAGES/DRESSINGS) ×6 IMPLANT
DURAPREP 26ML APPLICATOR (WOUND CARE) ×2 IMPLANT
GLOVE BIOGEL PI IND STRL 8 (GLOVE) ×1 IMPLANT
GLOVE BIOGEL PI INDICATOR 8 (GLOVE) ×1
GLOVE ECLIPSE 8.0 STRL XLNG CF (GLOVE) ×4 IMPLANT
GOWN STRL REUS W/TWL XL LVL3 (GOWN DISPOSABLE) ×2 IMPLANT
MANIFOLD NEPTUNE II (INSTRUMENTS) ×2 IMPLANT
PACK ARTHROSCOPY WL (CUSTOM PROCEDURE TRAY) ×2 IMPLANT
PACK ICE MAXI GEL EZY WRAP (MISCELLANEOUS) ×2 IMPLANT
PAD ABD 8X10 STRL (GAUZE/BANDAGES/DRESSINGS) ×2 IMPLANT
PAD MASON LEG HOLDER (PIN) ×2 IMPLANT
SET ARTHROSCOPY TUBING (MISCELLANEOUS) ×1
SET ARTHROSCOPY TUBING LN (MISCELLANEOUS) ×1 IMPLANT
SPONGE GAUZE 4X4 12PLY (GAUZE/BANDAGES/DRESSINGS) ×2 IMPLANT
SUT ETHILON 3 0 PS 1 (SUTURE) ×2 IMPLANT
TOWEL OR 17X26 10 PK STRL BLUE (TOWEL DISPOSABLE) ×2 IMPLANT
TUBING CONNECTING 10 (TUBING) IMPLANT
WAND 90 DEG TURBOVAC W/CORD (SURGICAL WAND) ×2 IMPLANT
WRAP KNEE MAXI GEL POST OP (GAUZE/BANDAGES/DRESSINGS) ×2 IMPLANT

## 2014-02-25 NOTE — Progress Notes (Signed)
Dr. Marcell Barlow made aware of patient's CBG results in PACU- 78

## 2014-02-25 NOTE — Brief Op Note (Signed)
02/25/2014  3:08 PM  PATIENT:  Shan Levans  63 y.o. female  PRE-OPERATIVE DIAGNOSIS:  LEFT KNEE MEDIAL MENISCUS TEAR,Lateral Meniscus Tear and Osteoarthritis  POST-OPERATIVE DIAGNOSIS: Same as Pre-Op  PROCEDURE:  Procedure(s): LEFT KNEE ARTHROSCOPY WITH MEDIAL AND LATERAL MENISECTOMY, MICROFRACTURE, SYNOVECTOMY SUPRA PATELLA (Left),Microfracture of Medial Femoral Condyle  SURGEON:  Surgeon(s) and Role:    * Tobi Bastos, MD - Primary    ASSISTANTS:OR Nurse   ANESTHESIA:   general  EBL:     BLOOD ADMINISTERED:none  DRAINS: none   LOCAL MEDICATIONS USED:  MARCAINE 30cc of 0.25% with Epinephrine.    SPECIMEN:  No Specimen  DISPOSITION OF SPECIMEN:  N/A  COUNTS:  YES  TOURNIQUET:  * No tourniquets in log *  DICTATION: .Other Dictation: Dictation Number [id number]  PLAN OF CARE: Discharge to home after PACU  PATIENT DISPOSITION:  PACU - hemodynamically stable.   Delay start of Pharmacological VTE agent (>24hrs) due to surgical blood loss or risk of bleeding: yes

## 2014-02-25 NOTE — Transfer of Care (Signed)
Immediate Anesthesia Transfer of Care Note  Patient: [name]  Procedure(s) Performed: Procedure(s): LEFT KNEE ARTHROSCOPY WITH MEDIAL AND LATERAL MENISECTOMY, MICROFRACTURE, SYNOVECTOMY SUPRA PATELLA (Left)  Patient Location: PACU  Anesthesia Type:General  Level of Consciousness: awake, alert , oriented and patient cooperative  Airway & Oxygen Therapy: Patient Spontanous Breathing and Patient connected to face mask oxygen  Post-op Assessment: Report given to PACU RN, Post -op Vital signs reviewed and stable and Patient moving all extremities X 4  Post vital signs: Reviewed and stable  Complications: No apparent anesthesia complications

## 2014-02-25 NOTE — Anesthesia Postprocedure Evaluation (Signed)
  Anesthesia Post-op Note  Patient: [name]  Procedure(s) Performed: Procedure(s) (LRB): LEFT KNEE ARTHROSCOPY WITH MEDIAL AND LATERAL MENISECTOMY, MICROFRACTURE, SYNOVECTOMY SUPRA PATELLA (Left)  Patient Location: PACU  Anesthesia Type: General  Level of Consciousness: awake and alert   Airway and Oxygen Therapy: Patient Spontanous Breathing  Post-op Pain: mild  Post-op Assessment: Post-op Vital signs reviewed, Patient's Cardiovascular Status Stable, Respiratory Function Stable, Patent Airway and No signs of Nausea or vomiting  Last Vitals:  Filed Vitals:   02/25/14 1659  BP: 117/63  Pulse: 69  Temp: 36.4 C  Resp: 14    Post-op Vital Signs: stable   Complications: No apparent anesthesia complications

## 2014-02-25 NOTE — Interval H&P Note (Signed)
History and Physical Interval Note:  02/25/2014 1:43 PM  [name]  has presented today for surgery, with the diagnosis of LEFT KNEE MEDIAL MENISCUS TEAR  The various methods of treatment have been discussed with the patient and family. After consideration of risks, benefits and other options for treatment, the patient has consented to  Procedure(s): LEFT KNEE ARTHROSCOPY WITH MEDIAL MENISECTOMY (Left) as a surgical intervention .  The patient's history has been reviewed, patient examined, no change in status, stable for surgery.  I have reviewed the patient's chart and labs.  Questions were answered to the patient's satisfaction.     [name]

## 2014-02-25 NOTE — Discharge Instructions (Signed)

## 2014-02-25 NOTE — Anesthesia Preprocedure Evaluation (Addendum)
Anesthesia Evaluation  Patient identified by MRN, date of birth, ID band Patient awake    Reviewed: Allergy & Precautions, H&P , NPO status , Patient's Chart, lab work & pertinent test results  Airway Mallampati: II TM Distance: >3 FB Neck ROM: Full    Dental no notable dental hx.    Pulmonary neg pulmonary ROS,  breath sounds clear to auscultation  Pulmonary exam normal       Cardiovascular hypertension, Pt. on medications + CAD and + Past MI Rhythm:Regular Rate:Normal     Neuro/Psych negative neurological ROS  negative psych ROS   GI/Hepatic negative GI ROS, Neg liver ROS,   Endo/Other  diabetes, Type 2, Oral Hypoglycemic Agents  Renal/GU negative Renal ROS  negative genitourinary   Musculoskeletal negative musculoskeletal ROS (+)   Abdominal   Peds negative pediatric ROS (+)  Hematology negative hematology ROS (+)   Anesthesia Other Findings   Reproductive/Obstetrics negative OB ROS                          Anesthesia Physical Anesthesia Plan  ASA: III  Anesthesia Plan: General   Post-op Pain Management:    Induction: Intravenous  Airway Management Planned: LMA  Additional Equipment:   Intra-op Plan:   Post-operative Plan: Extubation in OR  Informed Consent: I have reviewed the patients History and Physical, chart, labs and discussed the procedure including the risks, benefits and alternatives for the proposed anesthesia with the patient or authorized representative who has indicated his/her understanding and acceptance.   Dental advisory given  Plan Discussed with: CRNA  Anesthesia Plan Comments:         Anesthesia Quick Evaluation

## 2014-02-25 NOTE — H&P (Signed)
[name] DOB: [birth year]  Chief Complaint: left knee pain  History of Present Illness The patient is a 63 year old female who presented with left knee pain. The patient reports left knee symptoms including: pain and weakness which began 6 months ago without any known injury. The patient describes the severity of the symptoms as moderate in severity. The patient describes their pain as aching and throbbing.The patient feels that the symptoms are worsening. She says that she is having discomfort at all times. It is increased with weightbearing. She does have discomfort when going up and down stairs. No specific locking or catching, but she does feel that she has decreased range of motion. She is also having soreness and tightness in the popliteal space. No groin pain or numbness and tingling in the leg. She has no prior history of injections or surgery on the knee. She had a cortisone injection in the left knee in April but did not have any relief of her symptoms. MRI of the left knee revealed a severe complex tear of the posterior horn and medial portion of the medial meniscus with displacement. She also has a horizontal tear through the anterior junction of the lateral meniscus.  Allergies No Known Drug Allergies.   Family History Cancer. Father, Maternal Grandfather, Mother, Paternal Grandfather. mother, father, grandfather mothers side and grandfather fathers side Chronic Obstructive Lung Disease. mother Heart Disease. Brother, Father. father Rheumatoid Arthritis. Sister. sister Osteoarthritis. Sister.  Social History Exercise. Exercises rarely; does running / walking, Exercises monthly; does running / walking Illicit drug use. no Living situation. live alone Children. 3 Tobacco use. Never smoker.never smoker Tobacco / smoke exposure. no Marital status. divorced Alcohol use. current drinker; drinks beer and wine; only occasionally per week Current  drinker.Currently drinks beer only occasionally per week  Medication History aspirin 81 MG tablet, Take 81 mg by mouth every morning. , Disp: , Rfl: ;   atenolol (TENORMIN) 100 MG tablet, Take 100 mg by mouth at bedtime., Disp: , Rfl: ;   Canagliflozin (INVOKANA) 300 MG TABS, Take 300 mg by mouth every morning., Disp: , Rfl: ;   Cholecalciferol (VITAMIN D-3) 5000 UNITS TABS, Take 2 tablets by mouth every other day. , Disp: , Rfl:  enalapril (VASOTEC) 10 MG tablet, Take 10 mg by mouth every evening. , Disp: , Rfl: ;   esomeprazole (NEXIUM) 40 MG capsule, Take 40 mg by mouth every evening. , Disp: , Rfl: ;   fenofibrate micronized (LOFIBRA) 134 MG capsule, Take 134 mg by mouth daily before breakfast., Disp: , Rfl: ;   hydrochlorothiazide (HYDRODIURIL) 25 MG tablet, Take 25 mg by mouth every morning., Disp: , Rfl:  metFORMIN (GLUCOPHAGE-XR) 500 MG 24 hr tablet, Take 500-1,000 mg by mouth 3 (three) times daily. Takes 1 at breakfast 1 at lunch and 2 at dinner, Disp: , Rfl: ;   potassium chloride (K-DUR,KLOR-CON) 10 MEQ tablet, Take 10 mEq by mouth 2 (two) times daily., Disp: , Rfl: ;   pravastatin (PRAVACHOL) 40 MG tablet, Take 40 mg by mouth every evening. , Disp: , Rfl:   Past Surgical History Cataract Surgery. bilateral Cesarean Delivery. 3  Gallbladder Surgery. open Hemorrhoidectomy  Past Medical History Hypertension Hypercholesterolemia Myocardial infarction Osteoarthritis  Review of Systems General:Not Present- Chills, Fever, Night Sweats, Appetite Loss, Fatigue, Feeling sick, Weight Gain and Weight Loss. Skin:Not Present- Itching, Rash, Skin Color Changes, Ulcer, Psoriasis and Change in Hair or Nails. HEENT:Not Present- Sensitivity to light, Hearing problems, Nose Bleed and  Ringing in the Ears. Neck:Not Present- Swollen Glands and Neck Mass. Respiratory:Not Present- Snoring, Chronic Cough, Bloody sputum and Dyspnea. Cardiovascular:Not Present- Shortness of Breath, Chest  Pain, Swelling of Extremities, Leg Cramps and Palpitations. Gastrointestinal:Not Present- Bloody Stool, Heartburn, Abdominal Pain, Vomiting, Nausea and Incontinence of Stool. Female Genitourinary:Not Present- Blood in Urine, Menstrual Irregularities, Frequency, Incontinence and Nocturia. Musculoskeletal:Present- Joint Pain. Not Present- Muscle Weakness, Muscle Pain, Joint Stiffness, Joint Swelling and Back Pain. Neurological:Not Present- Tingling, Numbness, Burning, Tremor, Headaches and Dizziness. Psychiatric:Not Present- Anxiety, Depression and Memory Loss. Endocrine:Not Present- Cold Intolerance, Heat Intolerance, Excessive hunger and Excessive Thirst. Hematology:Not Present- Abnormal Bleeding, Anemia, Blood Clots and Easy Bruising.  Vitals Weight: 197 lb Height: 63.75 in Body Surface Area: 2 m Body Mass Index: 34.08 kg/m Pulse: 82 (Regular) BP: 138/81 (Sitting, Left Arm, Standard)  Physical Exam Alert and oriented. She is in no acute distress. She is tender along the medial joint line. Negative McMurray. She does lack a couple of degrees of extension. Flexion back to 120 degrees. Moderate patellofemoral crepitus. No effusion noted. She does have small Baker cyst palpated in the popliteal space. Normal painless range of motion in the hips. Distal pulse is 2+. Sensation and motor function is intact in the lower extremities. Exam of the right knee is essentially unremarkable. Heart sounds normal. RRR. Lungs clear to auscultation. Abdomen soft and nontender. EOM intact.   RADIOGRAPHS AP and lateral views of the knee show that she has narrowing of the medial and patellofemoral compartment of the left knee with osteophyte formation along the medial femoral condyle. No fractures noted.  Assessment & Plan Medial and lateral meniscus tear, left knee  She has a severe complex tear of the posterior horn and medial portion of the medial meniscus with displacement. She also has a  horizontal tear through the anterior junction of the lateral meniscus. She needs an arthroscopic medial and most likely lateral meniscectomy, left knee. Dr. Gladstone Lighter discussed the possible complications which are rare such as infection and blood clots. This will be an outpatient procedure.     Ardeen Jourdain, PA-C

## 2014-02-26 ENCOUNTER — Encounter (HOSPITAL_COMMUNITY): Payer: Self-pay | Admitting: Orthopedic Surgery

## 2014-02-26 NOTE — Op Note (Signed)
[name], [name]               ACCOUNT NO.:  [id number]  MEDICAL RECORD NO.:  [id number]  LOCATION:  [facility]                         FACILITY:  [facility]  PHYSICIAN:  Kipp Brood. Shaylea Ucci, M.D.DATE OF BIRTH:  [birth year]  DATE OF PROCEDURE:  02/25/2014 DATE OF DISCHARGE:  02/25/2014                              OPERATIVE REPORT   PREOPERATIVE DIAGNOSES: 1. Osteoarthritis, left knee. 2. Torn medial meniscus, left knee. 3. Torn lateral meniscus, left knee.  POSTOPERATIVE DIAGNOSES: 1. Osteoarthritis, left knee. 2. Torn medial meniscus, left knee. 3. Torn lateral meniscus, left knee.  OPERATION: 1. Diagnostic arthroscopy, left knee. 2. Medial meniscectomy, left knee. 3. Lateral meniscectomy, left knee. 4. Synovectomy, suprapatellar pouch, left knee. 5. Abrasion chondroplasty, medial femoral condyle, left knee. 6. Microfracture technique medial femoral condyle, left knee.  PROCEDURE:  Under general anesthesia, routine orthopedic prep and draping of the left lower extremity was carried out.  The appropriate time-out was first carried out.  I also marked the appropriate left leg in the holding area.  At this time, she had 1 g of IV Ancef.  A small punctate incision was made in the suprapatellar pouch, inflow cannula was inserted, knee was distended with saline.  Another small punctate incision was made in the anterolateral joint.  The complete diagnostic arthroscopy was carried out.  I went over the lateral joint, lateral condyle was fine.  She had a torn lateral meniscus.  I did a partial lateral meniscectomy utilizing ArthroCare and also synovectomy in the lateral compartment.  I went up in the suprapatellar pouch and did a synovectomy.  She had chronic synovitis.  The cruciates were intact.  I went down into the medial joint and she had severe osteoarthritic changes.  I did an abrasion chondroplasty of the medial femoral condyle and the tibial plateau and I did a microfracture  technique on the medial femoral condyle.  I also did a partial medial meniscectomy of the posterior horn of the medial meniscus.  I thoroughly irrigated out the knee, removed the fluid, closed all 3 punctate incisions with 3-0 nylon suture.  I injected 30 mL of 0.25% Marcaine with epinephrine in knee joint and a sterile Neosporin dressing was applied.  Postop, she will be on aspirin 325 mg b.i.d. starting today and for 2 weeks as an anticoagulant.  She will also be on Percocet 10/325 for pain.  She will be on crutches, partial weightbearing as tolerated, and I will see her in 10-12 days in the office or prior to if there is a problem.          ______________________________ Kipp Brood Gladstone Lighter, M.D.     RAG/MEDQ  D:  02/25/2014  T:  02/26/2014  Job:  [id number]

## 2014-03-01 ENCOUNTER — Other Ambulatory Visit: Payer: Self-pay | Admitting: Emergency Medicine

## 2014-03-11 ENCOUNTER — Ambulatory Visit (INDEPENDENT_AMBULATORY_CARE_PROVIDER_SITE_OTHER): Payer: BC Managed Care – PPO | Admitting: Physician Assistant

## 2014-03-11 ENCOUNTER — Encounter: Payer: Self-pay | Admitting: Physician Assistant

## 2014-03-11 VITALS — BP 110/72 | HR 68 | Temp 97.7°F | Resp 16 | Wt 187.0 lb

## 2014-03-11 DIAGNOSIS — Z23 Encounter for immunization: Secondary | ICD-10-CM

## 2014-03-11 DIAGNOSIS — E559 Vitamin D deficiency, unspecified: Secondary | ICD-10-CM

## 2014-03-11 DIAGNOSIS — I10 Essential (primary) hypertension: Secondary | ICD-10-CM

## 2014-03-11 DIAGNOSIS — Z79899 Other long term (current) drug therapy: Secondary | ICD-10-CM

## 2014-03-11 DIAGNOSIS — E1129 Type 2 diabetes mellitus with other diabetic kidney complication: Secondary | ICD-10-CM

## 2014-03-11 DIAGNOSIS — E782 Mixed hyperlipidemia: Secondary | ICD-10-CM

## 2014-03-11 DIAGNOSIS — R7309 Other abnormal glucose: Secondary | ICD-10-CM

## 2014-03-11 LAB — CBC WITH DIFFERENTIAL/PLATELET
BASOS ABS: 0.1 10*3/uL (ref 0.0–0.1)
Basophils Relative: 1 % (ref 0–1)
EOS PCT: 2 % (ref 0–5)
Eosinophils Absolute: 0.2 10*3/uL (ref 0.0–0.7)
HCT: 39.9 % (ref 36.0–46.0)
Hemoglobin: 13.6 g/dL (ref 12.0–15.0)
LYMPHS ABS: 2.7 10*3/uL (ref 0.7–4.0)
Lymphocytes Relative: 32 % (ref 12–46)
MCH: 27.8 pg (ref 26.0–34.0)
MCHC: 34.1 g/dL (ref 30.0–36.0)
MCV: 81.6 fL (ref 78.0–100.0)
MONO ABS: 0.5 10*3/uL (ref 0.1–1.0)
Monocytes Relative: 6 % (ref 3–12)
Neutro Abs: 4.9 10*3/uL (ref 1.7–7.7)
Neutrophils Relative %: 59 % (ref 43–77)
Platelets: 336 10*3/uL (ref 150–400)
RBC: 4.89 MIL/uL (ref 3.87–5.11)
RDW: 14.5 % (ref 11.5–15.5)
WBC: 8.3 10*3/uL (ref 4.0–10.5)

## 2014-03-11 LAB — BASIC METABOLIC PANEL WITH GFR
BUN: 25 mg/dL — AB (ref 6–23)
CO2: 23 meq/L (ref 19–32)
CREATININE: 0.71 mg/dL (ref 0.50–1.10)
Calcium: 9.8 mg/dL (ref 8.4–10.5)
Chloride: 99 mEq/L (ref 96–112)
GFR, Est African American: >89 mL/min
GFR, Est Non African American: >89 mL/min
Glucose, Bld: 88 mg/dL (ref 70–99)
Potassium: 4.2 mEq/L (ref 3.5–5.3)
Sodium: 133 mEq/L — ABNORMAL LOW (ref 135–145)

## 2014-03-11 LAB — HEPATIC FUNCTION PANEL
ALBUMIN: 4.3 g/dL (ref 3.5–5.2)
ALT: 22 U/L (ref 0–35)
AST: 15 U/L (ref 0–37)
Alkaline Phosphatase: 44 U/L (ref 39–117)
Bilirubin, Direct: 0.1 mg/dL (ref 0.0–0.3)
Indirect Bilirubin: 0.4 mg/dL (ref 0.2–1.2)
TOTAL PROTEIN: 6.8 g/dL (ref 6.0–8.3)
Total Bilirubin: 0.5 mg/dL (ref 0.2–1.2)

## 2014-03-11 LAB — MAGNESIUM: Magnesium: 2.1 mg/dL (ref 1.5–2.5)

## 2014-03-11 LAB — HEMOGLOBIN A1C
HEMOGLOBIN A1C: 5.5 % (ref <5.7)
Mean Plasma Glucose: 111 mg/dL (ref <117)

## 2014-03-11 LAB — LIPID PANEL
Cholesterol: 174 mg/dL (ref 0–200)
HDL: 60 mg/dL (ref >39)
LDL Cholesterol: 87 mg/dL (ref 0–99)
Total CHOL/HDL Ratio: 2.9 Ratio
Triglycerides: 133 mg/dL (ref <150)
VLDL: 27 mg/dL (ref 0–40)

## 2014-03-11 LAB — TSH: TSH: 2.447 u[IU]/mL (ref 0.350–4.500)

## 2014-03-11 NOTE — Progress Notes (Signed)
Assessment and Plan:  Hypertension: Continue medication, monitor blood pressure at home. Continue DASH diet. Cholesterol: Continue diet and exercise. Check cholesterol.  Diabetes-Continue diet and exercise. Check A1C Vitamin D Def- check level and continue medications.  Obesity with co morbidities- long discussion about weight loss, diet, and exercise TDAP  Continue diet and meds as discussed. Further disposition pending results of labs. Discussed med's effects and SE's.    HPI 63 y.o. female  presents for 3 month follow up with hypertension, hyperlipidemia, diabetes and vitamin D. Her blood pressure has been controlled at home, today their BP is BP: 110/72 mmHg She does not workout currently due to recent knee surgery. She denies chest pain, shortness of breath, dizziness.  She is on cholesterol medication, pravastatin and fenofibrate, and denies myalgias. Her cholesterol is at goal. The cholesterol last visit was:   Lab Results  Component Value Date   CHOL 155 12/09/2013   HDL 55 12/09/2013   LDLCALC 71 12/09/2013   TRIG 143 12/09/2013   CHOLHDL 2.8 12/09/2013   She has been working on diet and exercise for Diabetes, she is on an ACE, she is on invokana and metformin, sugar has been running 95-110 and denies hypoglycemia , paresthesia of the feet, polydipsia, polyuria and visual disturbances. Last A1C in the office was:  Lab Results  Component Value Date   HGBA1C 5.7* 12/09/2013   Patient is on Vitamin D supplement. Lab Results  Component Value Date   VD25OH 90* 12/09/2013   Had Left knee arthroscopy on 02/27/2014 with Dr. Melvyn Novas, she is still using a cane but states she is doing better, follows up this Thursday.     BMI is Body mass index is 32.08 kg/(m^2)., she is working on diet and exercise and has done well.  Wt Readings from Last 3 Encounters:  03/11/14 187 lb (84.823 kg)  02/25/14 188 lb 3 oz (85.361 kg)  02/25/14 188 lb 3 oz (85.361 kg)   DEE with Dr. Delman Cheadle 3 weeks ago.   Visiting son in Oct and visiting new grand daughter that will be a few weeks old at that time and she would like the TDAP.   Current Medications:  Current Outpatient Prescriptions on File Prior to Visit  Medication Sig Dispense Refill  . atenolol (TENORMIN) 100 MG tablet TAKE 1 TABLET BY MOUTH EVERY DAY  90 tablet  0  . Canagliflozin (INVOKANA) 300 MG TABS Take 300 mg by mouth every morning.      . Cholecalciferol (VITAMIN D-3) 5000 UNITS TABS Take 2 tablets by mouth every other day.       . enalapril (VASOTEC) 10 MG tablet Take 10 mg by mouth every evening.       Marland Kitchen esomeprazole (NEXIUM) 40 MG capsule Take 40 mg by mouth every evening.       . fenofibrate micronized (LOFIBRA) 134 MG capsule Take 134 mg by mouth daily before breakfast.      . hydrochlorothiazide (HYDRODIURIL) 25 MG tablet Take 25 mg by mouth every morning.      . metFORMIN (GLUCOPHAGE-XR) 500 MG 24 hr tablet Take 500-1,000 mg by mouth 3 (three) times daily. Takes 1 at breakfast 1 at lunch and 2 at dinner      . oxyCODONE-acetaminophen (PERCOCET) 10-325 MG per tablet Take 1 tablet by mouth every 4 (four) hours as needed for pain.  40 tablet  0  . potassium chloride (K-DUR,KLOR-CON) 10 MEQ tablet Take 10 mEq by mouth 2 (two) times daily.      Marland Kitchen  pravastatin (PRAVACHOL) 40 MG tablet Take 40 mg by mouth every evening.        No current facility-administered medications on file prior to visit.   Medical History:  Past Medical History  Diagnosis Date  . Hemorrhoids   . Arthritis   . HTN (hypertension)   . CAD (coronary artery disease)   . Coronary disease     nonobstructive, history of  . Hypercholesterolemia   . Gastric reflux     history of  . Diabetes mellitus without complication   . Myocardial infarction 2004    slight mi x 1   Allergies: No Known Allergies   Review of Systems: [X]  = complains of  [ ]  = denies  General: Fatigue [ ]  Fever [ ]  Chills [ ]  Weakness [ ]   Insomnia [ ]  Eyes: Redness [ ]  Blurred vision  [ ]  Diplopia [ ]   ENT: Congestion [ ]  Sinus Pain [ ]  Post Nasal Drip [ ]  Sore Throat [ ]  Earache [ ]   Cardiac: Chest pain/pressure [ ]  SOB [ ]  Orthopnea [ ]   Palpitations [ ]   Paroxysmal nocturnal dyspnea[ ]  Claudication [ ]  Edema [ ]   Pulmonary: Cough [ ]  Wheezing[ ]   SOB [ ]   Snoring [ ]   GI: Nausea [ ]  Vomiting[ ]  Dysphagia[ ]  Heartburn[ ]  Abdominal pain [ ]  Constipation [ ] ; Diarrhea [ ] ; BRBPR [ ]  Melena[ ]  GU: Hematuria[ ]  Dysuria [ ]  Nocturia[ ]  Urgency [ ]   Hesitancy [ ]  Discharge [ ]  Neuro: Headaches[ ]  Vertigo[ ]  Paresthesias[ ]  Spasm [ ]  Speech changes [ ]  Incoordination [ ]   Ortho: Arthritis [x ] Joint pain [x ] Muscle pain [ ]  Joint swelling [ ]  Back Pain [ ]  Skin:  Rash [ ]   Pruritis [ ]  Change in skin lesion [ ]   Psych: Depression[ ]  Anxiety[ ]  Confusion [ ]  Memory loss [ ]   Heme/Lypmh: Bleeding [ ]  Bruising [ ]  Enlarged lymph nodes [ ]   Endocrine: Visual blurring [ ]  Paresthesia [ ]  Polyuria [ ]  Polydypsea [ ]    Heat/cold intolerance [ ]  Hypoglycemia [ ]   Family history- Review and unchanged Social history- Review and unchanged Physical Exam: BP 110/72  Pulse 68  Temp(Src) 97.7 F (36.5 C)  Resp 16  Wt 187 lb (84.823 kg) Wt Readings from Last 3 Encounters:  03/11/14 187 lb (84.823 kg)  02/25/14 188 lb 3 oz (85.361 kg)  02/25/14 188 lb 3 oz (85.361 kg)   General Appearance: Well nourished, in no apparent distress. Eyes: PERRLA, EOMs, conjunctiva no swelling or erythema Sinuses: No Frontal/maxillary tenderness ENT/Mouth: Ext aud canals clear, TMs without erythema, bulging. No erythema, swelling, or exudate on post pharynx.  Tonsils not swollen or erythematous. Hearing normal.  Neck: Supple, thyroid normal.  Respiratory: Respiratory effort normal, BS equal bilaterally without rales, rhonchi, wheezing or stridor.  Cardio: RRR with no MRGs. Brisk peripheral pulses without edema.  Abdomen: Soft, + BS.  Non tender, no guarding, rebound, hernias, masses. Lymphatics: Non  tender without lymphadenopathy.  Musculoskeletal: Full ROM except left knee decreased ROM due to recent surgery, slight swelling, no warmth, erythema, 5/5 strength, gait antalgic with cane  Skin: Warm, dry without rashes, lesions, ecchymosis.  Neuro: Cranial nerves intact. No cerebellar symptoms. Sensation intact.  Psych: Awake and oriented X 3, normal affect, Insight and Judgment appropriate.    Vicie Mutters 11:45 AM

## 2014-03-12 ENCOUNTER — Ambulatory Visit: Payer: Self-pay | Admitting: Emergency Medicine

## 2014-03-12 LAB — VITAMIN D 25 HYDROXY (VIT D DEFICIENCY, FRACTURES): VIT D 25 HYDROXY: 113 ng/mL — AB (ref 30–89)

## 2014-03-12 LAB — INSULIN, FASTING: Insulin fasting, serum: 14 u[IU]/mL (ref 3–28)

## 2014-03-17 ENCOUNTER — Ambulatory Visit: Payer: Self-pay | Admitting: Physician Assistant

## 2014-04-01 ENCOUNTER — Other Ambulatory Visit: Payer: Self-pay | Admitting: Physician Assistant

## 2014-04-19 ENCOUNTER — Other Ambulatory Visit: Payer: Self-pay | Admitting: Emergency Medicine

## 2014-05-03 ENCOUNTER — Other Ambulatory Visit: Payer: Self-pay | Admitting: Physician Assistant

## 2014-06-02 ENCOUNTER — Encounter: Payer: Self-pay | Admitting: Internal Medicine

## 2014-06-02 ENCOUNTER — Ambulatory Visit (INDEPENDENT_AMBULATORY_CARE_PROVIDER_SITE_OTHER): Payer: BC Managed Care – PPO | Admitting: Internal Medicine

## 2014-06-02 VITALS — BP 108/60 | HR 62 | Temp 98.4°F | Resp 16 | Ht 63.0 in | Wt 180.0 lb

## 2014-06-02 DIAGNOSIS — I10 Essential (primary) hypertension: Secondary | ICD-10-CM

## 2014-06-02 DIAGNOSIS — R6889 Other general symptoms and signs: Secondary | ICD-10-CM

## 2014-06-02 DIAGNOSIS — Z1212 Encounter for screening for malignant neoplasm of rectum: Secondary | ICD-10-CM

## 2014-06-02 DIAGNOSIS — Z113 Encounter for screening for infections with a predominantly sexual mode of transmission: Secondary | ICD-10-CM

## 2014-06-02 DIAGNOSIS — Z0001 Encounter for general adult medical examination with abnormal findings: Secondary | ICD-10-CM

## 2014-06-02 DIAGNOSIS — E559 Vitamin D deficiency, unspecified: Secondary | ICD-10-CM

## 2014-06-02 DIAGNOSIS — E782 Mixed hyperlipidemia: Secondary | ICD-10-CM

## 2014-06-02 DIAGNOSIS — E1122 Type 2 diabetes mellitus with diabetic chronic kidney disease: Secondary | ICD-10-CM

## 2014-06-02 DIAGNOSIS — Z79899 Other long term (current) drug therapy: Secondary | ICD-10-CM

## 2014-06-02 DIAGNOSIS — R945 Abnormal results of liver function studies: Secondary | ICD-10-CM

## 2014-06-02 DIAGNOSIS — N189 Chronic kidney disease, unspecified: Secondary | ICD-10-CM

## 2014-06-02 DIAGNOSIS — R7989 Other specified abnormal findings of blood chemistry: Secondary | ICD-10-CM

## 2014-06-02 LAB — CBC WITH DIFFERENTIAL/PLATELET
BASOS PCT: 0 % (ref 0–1)
Basophils Absolute: 0 10*3/uL (ref 0.0–0.1)
EOS ABS: 0.2 10*3/uL (ref 0.0–0.7)
Eosinophils Relative: 3 % (ref 0–5)
HEMATOCRIT: 41.3 % (ref 36.0–46.0)
Hemoglobin: 13.9 g/dL (ref 12.0–15.0)
Lymphocytes Relative: 27 % (ref 12–46)
Lymphs Abs: 1.6 10*3/uL (ref 0.7–4.0)
MCH: 28.1 pg (ref 26.0–34.0)
MCHC: 33.7 g/dL (ref 30.0–36.0)
MCV: 83.6 fL (ref 78.0–100.0)
MONO ABS: 0.4 10*3/uL (ref 0.1–1.0)
Monocytes Relative: 6 % (ref 3–12)
NEUTROS PCT: 64 % (ref 43–77)
Neutro Abs: 3.9 10*3/uL (ref 1.7–7.7)
Platelets: 337 10*3/uL (ref 150–400)
RBC: 4.94 MIL/uL (ref 3.87–5.11)
RDW: 13.6 % (ref 11.5–15.5)
WBC: 6.1 10*3/uL (ref 4.0–10.5)

## 2014-06-02 LAB — HEMOGLOBIN A1C
HEMOGLOBIN A1C: 5.5 % (ref <5.7)
MEAN PLASMA GLUCOSE: 111 mg/dL (ref <117)

## 2014-06-02 NOTE — Progress Notes (Signed)
Patient ID: [name], female   DOB: March 07, 1951, 63 y.o.   MRN: [id number]  Annual Screening Comprehensive Examination  This very nice 63 y.o.female presents for complete physical.  Patient has been followed for HTN, T2_NIDDM, Hyperlipidemia, and Vitamin D Deficiency.    HTN predates since 43. Patient's BP has been controlled at home and patient denies any cardiac symptoms as chest pain, palpitations, shortness of breath, dizziness or ankle swelling. Today's BP: 108/60 mmHg. 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   Patient's hyperlipidemia is controlled with diet and medications. Patient denies myalgias or other medication SE's. Last lipids were at goal - Total  Chol 174; HDL  60; LDL  87; Triglycerides 133 on 03/11/2014.   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 Patient denies reactive hypoglycemic symptoms, visual blurring, diabetic polys, or paresthesias. Last A1c was  5.5% on 03/11/2014.   Finally, patient has history of Vitamin D Deficiency and last Vitamin D was 113 on 03/11/2014 and dose was tapered.  Medication Sig  . atenolol  100 MG tablet TAKE 1 TABLET BY MOUTH EVERY DAY  . VITAMIN D-3 5000 UNITS  Take 2 tablets by mouth every other day.   . enalapril  10 MG tablet Take 10 mg by mouth every evening.   Marland Kitchen esomeprazole40 MG cap Take 40 mg by mouth every evening.   . fenofibrate micronized134 MG capsule Take 134 mg by mouth daily before breakfast.  . HCTZ 25 MG tablet TAKE 1 TABLET BY MOUTH EVERY MORNING FOR FLUID  . INVOKANA 300 MG TABS TAKE 1 TABLET BY MOUTH EVERY DAY  . metFORMINXR 500 MG  Take 500-1,000 mg by mouth 3 (three) times daily. Takes 1 at breakfast 1 at lunch and 2 at dinner  . KCl 10 MEQ tablet Take 10 mEq by mouth 2 (two) times daily.  . pravastatin  40 MG tablet Take  40 mg by mouth every evening.    No Known Allergies  Past Medical History  Diagnosis Date  . Hemorrhoids   . Arthritis   . HTN (hypertension)   . CAD (coronary artery disease)   . Coronary disease     nonobstructive, history of  . Hypercholesterolemia   . Gastric reflux     history of  . Diabetes mellitus without complication   . Myocardial infarction 2004    slight mi x 1   Health Maintenance  Topic Date Due  . Pneumococcal Polysaccharide Vaccine (##2) 01/18/2006  . Influenza Vaccine  03/15/2014  . Urine Microalbumin  05/28/2014  . Hemoglobin A1c  09/11/2014  . Ophthalmology Exam  02/20/2015  . Colonoscopy  02/24/2015  . Foot Exam  03/12/2015  . Mammogram  06/19/2015  . Pap Smear  05/29/2016  . Tetanus/tdap  03/11/2024  . Zostavax  Completed   Immunization History  Administered Date(s) Administered  . Influenza-Unspecified 04/15/2013, 05/06/2014  . Tdap 03/11/2014  . Zoster 05/23/2012   Past Surgical History  Procedure Laterality Date  . Cardiac catheterization  November 25 2004    left heart, minor coronary atherosclerosis  . Angioplasty    . Cesarean section      x 3  . Cholecystectomy  36 yrs ago  . Eye surgery Bilateral dec 2010     lens replacements  .  Knee arthroscopy with medial menisectomy Left 02/25/2014    Procedure: LEFT KNEE ARTHROSCOPY WITH MEDIAL AND LATERAL MENISECTOMY, MICROFRACTURE, SYNOVECTOMY SUPRA PATELLA;  Surgeon: Tobi Bastos, MD;  Location: [facility];  Service: Orthopedics;  Laterality: Left;   Family History  Problem Relation Age of Onset  . Hypertension Mother   . COPD Mother   . Thyroid disease Mother   . Heart attack Father   . Heart disease Father   . Arthritis Sister     Rheumatoid  . Hypertension Sister   . Graves' disease Sister   . Thyroid disease Sister     History  Substance Use Topics  . Smoking status: Never Smoker   . Smokeless tobacco: Never Used  . Alcohol Use: 0.0 oz/week     Comment: occasional     ROS Constitutional: Denies fever, chills, weight loss/gain, headaches, insomnia, fatigue, night sweats, and change in appetite. Eyes: Denies redness, blurred vision, diplopia, discharge, itchy, watery eyes.  ENT: Denies discharge, congestion, post nasal drip, epistaxis, sore throat, earache, hearing loss, dental pain, Tinnitus, Vertigo, Sinus pain, snoring.  Cardio: Denies chest pain, palpitations, irregular heartbeat, syncope, dyspnea, diaphoresis, orthopnea, PND, claudication, edema Respiratory: denies cough, dyspnea, DOE, pleurisy, hoarseness, laryngitis, wheezing.  Gastrointestinal: Denies dysphagia, heartburn, reflux, water brash, pain, cramps, nausea, vomiting, bloating, diarrhea, constipation, hematemesis, melena, hematochezia, jaundice, hemorrhoids Genitourinary: Denies dysuria, frequency, urgency, nocturia, hesitancy, discharge, hematuria, flank pain Breast: Breast lumps, nipple discharge, bleeding.  Musculoskeletal: Denies arthralgia, myalgia, stiffness, Jt. Swelling, pain, limp, and strain/sprain. Denies falls. Skin: Denies puritis, rash, hives, warts, acne, eczema, changing in skin lesion Neuro: No weakness, tremor, incoordination, spasms, paresthesia, pain Psychiatric: Denies confusion, memory loss, sensory loss. Denies Depression. Endocrine: Denies change in weight, skin, hair change, nocturia, and paresthesia, diabetic polys, visual blurring, hyper / hypo glycemic episodes.  Heme/Lymph: No excessive bleeding, bruising, enlarged lymph nodes.  Physical Exam  BP 108/60  Pulse 62  Temp(Src) 98.4 F (36.9 C) (Temporal)  Resp 16  Ht 5\' 3"  (1.6 m)  Wt 180 lb (81.647 kg)  BMI 31.89 kg/m2  General Appearance: Well nourished and in no apparent distress. Eyes: PERRLA, EOMs, conjunctiva no swelling or erythema, normal fundi and vessels. Sinuses: No frontal/maxillary tenderness ENT/Mouth: EACs patent / TMs  nl. Nares clear without erythema, swelling, mucoid exudates. Oral hygiene is  good. No erythema, swelling, or exudate. Tongue normal, non-obstructing. Tonsils not swollen or erythematous. Hearing normal.  Neck: Supple, thyroid normal. No bruits, nodes or JVD. Respiratory: Respiratory effort normal.  BS equal and clear bilateral without rales, rhonci, wheezing or stridor. Cardio: Heart sounds are normal with regular rate and rhythm and no murmurs, rubs or gallops. Peripheral pulses are normal and equal bilaterally without edema. No aortic or femoral bruits. Chest: symmetric with normal excursions and percussion. Breasts: Symmetric, without lumps, nipple discharge, retractions, or fibrocystic changes.  Abdomen: Flat, soft, with bowl sounds. Nontender, no guarding, rebound, hernias, masses, or organomegaly.  Lymphatics: Non tender without lymphadenopathy.  Genitourinary:  Musculoskeletal: Full ROM all peripheral extremities, joint stability, 5/5 strength, and normal gait. Skin: Warm and dry without rashes, lesions, cyanosis, clubbing or  ecchymosis.  Neuro: Cranial nerves intact, reflexes equal bilaterally. Normal muscle tone, no cerebellar symptoms. Sensation intact.  Pysch: Awake and oriented X 3, normal affect, Insight and Judgment appropriate.  Assessment and Plan  1. Annual Screening Examination 2. Hypertension  3. Hyperlipidemia 4. T2_NIDDM w/CKD2 5. Vitamin D Deficiency   Continue prudent diet as discussed, weight control, BP monitoring, regular exercise,  and medications. Discussed med's effects and SE's. Screening labs and tests as requested with regular follow-up as recommended.

## 2014-06-02 NOTE — Patient Instructions (Signed)
Recommend the book "The END of DIETING" by Dr Baker Janus   and the book "The END of DIABETES " by Dr Excell Seltzer  At [email or web address] - get book & Audio CD's      Being diabetic has a  300% increased risk for heart attack, stroke, cancer, and alzheimer- type vascular dementia. It is very important that you work harder with diet by avoiding all foods that are white except chicken & fish. Avoid white rice (brown & wild rice is OK), white potatoes (sweetpotatoes in moderation is OK), White bread or wheat bread or anything made out of white flour like bagels, donuts, rolls, buns, biscuits, cakes, pastries, cookies, pizza crust, and pasta (made from white flour & egg whites) - vegetarian pasta or spinach or wheat pasta is OK. Multigrain breads like Arnold's or [employer], or multigrain sandwich thins or flatbreads.  Diet, exercise and weight loss can reverse and cure diabetes in the early stages.  Diet, exercise and weight loss is very important in the control and prevention of complications of diabetes which affects every system in your body, ie. Brain - dementia/stroke, eyes - glaucoma/blindness, heart - heart attack/heart failure, kidneys - dialysis, stomach - gastric paralysis, intestines - malabsorption, nerves - severe painful neuritis, circulation - gangrene & loss of a leg(s), and finally cancer and Alzheimers.    I recommend avoid fried & greasy foods,  sweets/candy, white rice (brown or wild rice or Quinoa is OK), white potatoes (sweet potatoes are OK) - anything made from white flour - bagels, doughnuts, rolls, buns, biscuits,white and wheat breads, pizza crust and traditional pasta made of white flour & egg white(vegetarian pasta or spinach or wheat pasta is OK).  Multi-grain bread is OK - like multi-grain flat bread or sandwich thins. Avoid alcohol in excess. Exercise is also important.    Eat all the vegetables you want - avoid meat, especially red meat and dairy - especially cheese.  Cheese  is the most concentrated form of trans-fats which is the worst thing to clog up our arteries. Veggie cheese is OK which can be found in the fresh produce section at [employer] or [employer] or [employer]  Preventive Care for Adults A healthy lifestyle and preventive care can promote health and wellness. Preventive health guidelines for women include the following key practices.  A routine yearly physical is a good way to check with your health care provider about your health and preventive screening. It is a chance to share any concerns and updates on your health and to receive a thorough exam.  Visit your dentist for a routine exam and preventive care every 6 months. Brush your teeth twice a day and floss once a day. Good oral hygiene prevents tooth decay and gum disease.  The frequency of eye exams is based on your age, health, family medical history, use of contact lenses, and other factors. Follow your health care provider's recommendations for frequency of eye exams.  Eat a healthy diet. Foods like vegetables, fruits, whole grains, low-fat dairy products, and lean protein foods contain the nutrients you need without too many calories. Decrease your intake of foods high in solid fats, added sugars, and salt. Eat the right amount of calories for you.Get information about a proper diet from your health care provider, if necessary.  Regular physical exercise is one of the most important things you can do for your health. Most adults should get at least 150 minutes of moderate-intensity exercise (any activity that increases  your heart rate and causes you to sweat) each week. In addition, most adults need muscle-strengthening exercises on 2 or more days a week.  Maintain a healthy weight. The body mass index (BMI) is a screening tool to identify possible weight problems. It provides an estimate of body fat based on height and weight. Your health care provider can find your BMI and can help you  achieve or maintain a healthy weight.For adults 20 years and older:  A BMI below 18.5 is considered underweight.  A BMI of 18.5 to 24.9 is normal.  A BMI of 25 to 29.9 is considered overweight.  A BMI of 30 and above is considered obese.  Maintain normal blood lipids and cholesterol levels by exercising and minimizing your intake of saturated fat. Eat a balanced diet with plenty of fruit and vegetables. Blood tests for lipids and cholesterol should begin at age 38 and be repeated every 5 years. If your lipid or cholesterol levels are high, you are over 50, or you are at high risk for heart disease, you may need your cholesterol levels checked more frequently.Ongoing high lipid and cholesterol levels should be treated with medicines if diet and exercise are not working.  If you smoke, find out from your health care provider how to quit. If you do not use tobacco, do not start.  Lung cancer screening is recommended for adults aged 64-80 years who are at high risk for developing lung cancer because of a history of smoking. A yearly low-dose CT scan of the lungs is recommended for people who have at least a 30-pack-year history of smoking and are a current smoker or have quit within the past 15 years. A pack year of smoking is smoking an average of 1 pack of cigarettes a day for 1 year (for example: 1 pack a day for 30 years or 2 packs a day for 15 years). Yearly screening should continue until the smoker has stopped smoking for at least 15 years. Yearly screening should be stopped for people who develop a health problem that would prevent them from having lung cancer treatment.  High blood pressure causes heart disease and increases the risk of stroke. Your blood pressure should be checked at least every 1 to 2 years. Ongoing high blood pressure should be treated with medicines if weight loss and exercise do not work.  If you are 79-57 years old, ask your health care provider if you should take  aspirin to prevent strokes.  Diabetes screening involves taking a blood sample to check your fasting blood sugar level. This should be done once every 3 years, after age 37, if you are within normal weight and without risk factors for diabetes. Testing should be considered at a younger age or be carried out more frequently if you are overweight and have at least 1 risk factor for diabetes.  Breast cancer screening is essential preventive care for women. You should practice "breast self-awareness." This means understanding the normal appearance and feel of your breasts and may include breast self-examination. Any changes detected, no matter how small, should be reported to a health care provider. Women in their 76s and 30s should have a clinical breast exam (CBE) by a health care provider as part of a regular health exam every 1 to 3 years. After age 55, women should have a CBE every year. Starting at age 79, women should consider having a mammogram (breast X-ray test) every year. Women who have a family history of breast  cancer should talk to their health care provider about genetic screening. Women at a high risk of breast cancer should talk to their health care providers about having an MRI and a mammogram every year.  Breast cancer gene (BRCA)-related cancer risk assessment is recommended for women who have family members with BRCA-related cancers. BRCA-related cancers include breast, ovarian, tubal, and peritoneal cancers. Having family members with these cancers may be associated with an increased risk for harmful changes (mutations) in the breast cancer genes BRCA1 and BRCA2. Results of the assessment will determine the need for genetic counseling and BRCA1 and BRCA2 testing.  Routine pelvic exams to screen for cancer are no longer recommended for nonpregnant women who are considered low risk for cancer of the pelvic organs (ovaries, uterus, and vagina) and who do not have symptoms. Ask your health  care provider if a screening pelvic exam is right for you.  If you have had past treatment for cervical cancer or a condition that could lead to cancer, you need Pap tests and screening for cancer for at least 20 years after your treatment. If Pap tests have been discontinued, your risk factors (such as having a new sexual partner) need to be reassessed to determine if screening should be resumed. Some women have medical problems that increase the chance of getting cervical cancer. In these cases, your health care provider may recommend more frequent screening and Pap tests.  Colorectal cancer can be detected and often prevented. Most routine colorectal cancer screening begins at the age of 58 years and continues through age 44 years. However, your health care provider may recommend screening at an earlier age if you have risk factors for colon cancer. On a yearly basis, your health care provider may provide home test kits to check for hidden blood in the stool. Use of a small camera at the end of a tube, to directly examine the colon (sigmoidoscopy or colonoscopy), can detect the earliest forms of colorectal cancer. Talk to your health care provider about this at age 84, when routine screening begins. Direct exam of the colon should be repeated every 5-10 years through age 33 years, unless early forms of pre-cancerous polyps or small growths are found.  Hepatitis C blood testing is recommended for all people born from [birth year] through 1965 and any individual with known risks for hepatitis C.  Pra  Osteoporosis is a disease in which the bones lose minerals and strength with aging. This can result in serious bone fractures or breaks. The risk of osteoporosis can be identified using a bone density scan. Women ages 36 years and over and women at risk for fractures or osteoporosis should discuss screening with their health care providers. Ask your health care provider whether you should take a calcium supplement  or vitamin D to reduce the rate of osteoporosis.  Menopause can be associated with physical symptoms and risks. Hormone replacement therapy is available to decrease symptoms and risks. You should talk to your health care provider about whether hormone replacement therapy is right for you.  Use sunscreen. Apply sunscreen liberally and repeatedly throughout the day. You should seek shade when your shadow is shorter than you. Protect yourself by wearing long sleeves, pants, a wide-brimmed hat, and sunglasses year round, whenever you are outdoors.  Once a month, do a whole body skin exam, using a mirror to look at the skin on your back. Tell your health care provider of new moles, moles that have irregular borders, moles that are  larger than a pencil eraser, or moles that have changed in shape or color.  Stay current with required vaccines (immunizations).  Influenza vaccine. All adults should be immunized every year.  Tetanus, diphtheria, and acellular pertussis (Td, Tdap) vaccine. Pregnant women should receive 1 dose of Tdap vaccine during each pregnancy. The dose should be obtained regardless of the length of time since the last dose. Immunization is preferred during the 27th-36th week of gestation. An adult who has not previously received Tdap or who does not know her vaccine status should receive 1 dose of Tdap. This initial dose should be followed by tetanus and diphtheria toxoids (Td) booster doses every 10 years. Adults with an unknown or incomplete history of completing a 3-dose immunization series with Td-containing vaccines should begin or complete a primary immunization series including a Tdap dose. Adults should receive a Td booster every 10 years.  Varicella vaccine. An adult without evidence of immunity to varicella should receive 2 doses or a second dose if she has previously received 1 dose. Pregnant females who do not have evidence of immunity should receive the first dose after  pregnancy. This first dose should be obtained before leaving the health care facility. The second dose should be obtained 4-8 weeks after the first dose.  Human papillomavirus (HPV) vaccine. Females aged 13-26 years who have not received the vaccine previously should obtain the 3-dose series. The vaccine is not recommended for use in pregnant females. However, pregnancy testing is not needed before receiving a dose. If a female is found to be pregnant after receiving a dose, no treatment is needed. In that case, the remaining doses should be delayed until after the pregnancy. Immunization is recommended for any person with an immunocompromised condition through the age of 26 years if she did not get any or all doses earlier. During the 3-dose series, the second dose should be obtained 4-8 weeks after the first dose. The third dose should be obtained 24 weeks after the first dose and 16 weeks after the second dose.  Zoster vaccine. One dose is recommended for adults aged 60 years or older unless certain conditions are present.  Measles, mumps, and rubella (MMR) vaccine. Adults born before 1957 generally are considered immune to measles and mumps. Adults born in 1957 or later should have 1 or more doses of MMR vaccine unless there is a contraindication to the vaccine or there is laboratory evidence of immunity to each of the three diseases. A routine second dose of MMR vaccine should be obtained at least 28 days after the first dose for students attending postsecondary schools, health care workers, or international travelers. People who received inactivated measles vaccine or an unknown type of measles vaccine during 1963-1967 should receive 2 doses of MMR vaccine. People who received inactivated mumps vaccine or an unknown type of mumps vaccine before 1979 and are at high risk for mumps infection should consider immunization with 2 doses of MMR vaccine. For females of childbearing age, rubella immunity should  be determined. If there is no evidence of immunity, females who are not pregnant should be vaccinated. If there is no evidence of immunity, females who are pregnant should delay immunization until after pregnancy. Unvaccinated health care workers born before 1957 who lack laboratory evidence of measles, mumps, or rubella immunity or laboratory confirmation of disease should consider measles and mumps immunization with 2 doses of MMR vaccine or rubella immunization with 1 dose of MMR vaccine.  Pneumococcal 13-valent conjugate (PCV13) vaccine. When   indicated, a person who is uncertain of her immunization history and has no record of immunization should receive the PCV13 vaccine. An adult aged 21 years or older who has certain medical conditions and has not been previously immunized should receive 1 dose of PCV13 vaccine. This PCV13 should be followed with a dose of pneumococcal polysaccharide (PPSV23) vaccine. The PPSV23 vaccine dose should be obtained at least 8 weeks after the dose of PCV13 vaccine. An adult aged 55 years or older who has certain medical conditions and previously received 1 or more doses of PPSV23 vaccine should receive 1 dose of PCV13. The PCV13 vaccine dose should be obtained 1 or more years after the last PPSV23 vaccine dose.    Pneumococcal polysaccharide (PPSV23) vaccine. When PCV13 is also indicated, PCV13 should be obtained first. All adults aged 47 years and older should be immunized. An adult younger than age 54 years who has certain medical conditions should be immunized. Any person who resides in a nursing home or long-term care facility should be immunized. An adult smoker should be immunized. People with an immunocompromised condition and certain other conditions should receive both PCV13 and PPSV23 vaccines. People with human immunodeficiency virus (HIV) infection should be immunized as soon as possible after diagnosis. Immunization during chemotherapy or radiation therapy should  be avoided. Routine use of PPSV23 vaccine is not recommended for American Indians, [place] Natives, or people younger than 65 years unless there are medical conditions that require PPSV23 vaccine. When indicated, people who have unknown immunization and have no record of immunization should receive PPSV23 vaccine. One-time revaccination 5 years after the first dose of PPSV23 is recommended for people aged 19-64 years who have chronic kidney failure, nephrotic syndrome, asplenia, or immunocompromised conditions. People who received 1-2 doses of PPSV23 before age 15 years should receive another dose of PPSV23 vaccine at age 48 years or later if at least 5 years have passed since the previous dose. Doses of PPSV23 are not needed for people immunized with PPSV23 at or after age 5 years.  Preventive Services / Frequency   Ages 22 to 77 years  Blood pressure check.** / Every 1 to 2 years.  Lipid and cholesterol check.** / Every 5 years beginning at age 41 years.  Lung cancer screening. / Every year if you are aged 41-80 years and have a 30-pack-year history of smoking and currently smoke or have quit within the past 15 years. Yearly screening is stopped once you have quit smoking for at least 15 years or develop a health problem that would prevent you from having lung cancer treatment.  Clinical breast exam.** / Every year after age 11 years.  BRCA-related cancer risk assessment.** / For women who have family members with a BRCA-related cancer (breast, ovarian, tubal, or peritoneal cancers).  Mammogram.** / Every year beginning at age 14 years and continuing for as long as you are in good health. Consult with your health care provider.  Pap test.** / Every 3 years starting at age 41 years through age 23 or 62 years with a history of 3 consecutive normal Pap tests.  HPV screening.** / Every 3 years from ages 31 years through ages 85 to 74 years with a history of 3 consecutive normal Pap  tests.  Fecal occult blood test (FOBT) of stool. / Every year beginning at age 33 years and continuing until age 37 years. You may not need to do this test if you get a colonoscopy every 10 years.  Flexible  sigmoidoscopy or colonoscopy.** / Every 5 years for a flexible sigmoidoscopy or every 10 years for a colonoscopy beginning at age 50 years and continuing until age 75 years.  Hepatitis C blood test.** / For all people born from 1945 through 1965 and any individual with known risks for hepatitis C.  Skin self-exam. / Monthly.  Influenza vaccine. / Every year.  Tetanus, diphtheria, and acellular pertussis (Tdap/Td) vaccine.** / Consult your health care provider. Pregnant women should receive 1 dose of Tdap vaccine during each pregnancy. 1 dose of Td every 10 years.  Varicella vaccine.** / Consult your health care provider. Pregnant females who do not have evidence of immunity should receive the first dose after pregnancy.  Zoster vaccine.** / 1 dose for adults aged 60 years or older.  Pneumococcal 13-valent conjugate (PCV13) vaccine.** / Consult your health care provider.  Pneumococcal polysaccharide (PPSV23) vaccine.** / 1 to 2 doses if you smoke cigarettes or if you have certain conditions.  Meningococcal vaccine.** / Consult your health care provider.  Hepatitis A vaccine.** / Consult your health care provider.  Hepatitis B vaccine.** / Consult your health care provider.  

## 2014-06-03 LAB — HEPATIC FUNCTION PANEL
ALT: 24 U/L (ref 0–35)
AST: 18 U/L (ref 0–37)
Albumin: 4.3 g/dL (ref 3.5–5.2)
Alkaline Phosphatase: 48 U/L (ref 39–117)
Bilirubin, Direct: 0.1 mg/dL (ref 0.0–0.3)
Indirect Bilirubin: 0.5 mg/dL (ref 0.2–1.2)
TOTAL PROTEIN: 6.5 g/dL (ref 6.0–8.3)
Total Bilirubin: 0.6 mg/dL (ref 0.2–1.2)

## 2014-06-03 LAB — BASIC METABOLIC PANEL WITH GFR
BUN: 17 mg/dL (ref 6–23)
CALCIUM: 9.9 mg/dL (ref 8.4–10.5)
CO2: 26 mEq/L (ref 19–32)
Chloride: 101 mEq/L (ref 96–112)
Creat: 0.66 mg/dL (ref 0.50–1.10)
GFR, Est Non African American: >89 mL/min
Glucose, Bld: 82 mg/dL (ref 70–99)
POTASSIUM: 4 meq/L (ref 3.5–5.3)
Sodium: 139 mEq/L (ref 135–145)

## 2014-06-03 LAB — LIPID PANEL
CHOLESTEROL: 144 mg/dL (ref 0–200)
HDL: 58 mg/dL (ref >39)
LDL Cholesterol: 63 mg/dL (ref 0–99)
TRIGLYCERIDES: 114 mg/dL (ref <150)
Total CHOL/HDL Ratio: 2.5 Ratio
VLDL: 23 mg/dL (ref 0–40)

## 2014-06-03 LAB — URINALYSIS, MICROSCOPIC ONLY
BACTERIA UA: NONE SEEN
Casts: NONE SEEN
Crystals: NONE SEEN
Squamous Epithelial / LPF: NONE SEEN

## 2014-06-03 LAB — MICROALBUMIN / CREATININE URINE RATIO
Creatinine, Urine: 59.9 mg/dL
MICROALB UR: 0.3 mg/dL (ref <2.0)
MICROALB/CREAT RATIO: 5 mg/g (ref 0.0–30.0)

## 2014-06-03 LAB — INSULIN, FASTING: INSULIN FASTING, SERUM: 4.8 u[IU]/mL (ref 2.0–19.6)

## 2014-06-03 LAB — TSH: TSH: 1.739 u[IU]/mL (ref 0.350–4.500)

## 2014-06-03 LAB — MAGNESIUM: MAGNESIUM: 1.9 mg/dL (ref 1.5–2.5)

## 2014-06-03 LAB — VITAMIN D 25 HYDROXY (VIT D DEFICIENCY, FRACTURES): VIT D 25 HYDROXY: 94 ng/mL — AB (ref 30–89)

## 2014-06-03 LAB — VITAMIN B12: VITAMIN B 12: 358 pg/mL (ref 211–911)

## 2014-06-07 ENCOUNTER — Other Ambulatory Visit: Payer: Self-pay | Admitting: Internal Medicine

## 2014-06-09 ENCOUNTER — Other Ambulatory Visit (INDEPENDENT_AMBULATORY_CARE_PROVIDER_SITE_OTHER): Payer: BC Managed Care – PPO | Admitting: *Deleted

## 2014-06-09 DIAGNOSIS — Z1212 Encounter for screening for malignant neoplasm of rectum: Secondary | ICD-10-CM

## 2014-06-09 LAB — POC HEMOCCULT BLD/STL (HOME/3-CARD/SCREEN)
Card #2 Fecal Occult Blod, POC: NEGATIVE
Card #3 Fecal Occult Blood, POC: NEGATIVE
FECAL OCCULT BLD: NEGATIVE

## 2014-06-16 ENCOUNTER — Other Ambulatory Visit: Payer: Self-pay | Admitting: Emergency Medicine

## 2014-06-17 ENCOUNTER — Other Ambulatory Visit: Payer: Self-pay | Admitting: Internal Medicine

## 2014-06-21 ENCOUNTER — Other Ambulatory Visit: Payer: Self-pay | Admitting: Internal Medicine

## 2014-07-12 ENCOUNTER — Encounter: Payer: Self-pay | Admitting: *Deleted

## 2014-07-14 ENCOUNTER — Other Ambulatory Visit: Payer: Self-pay

## 2014-07-14 MED ORDER — POTASSIUM CHLORIDE CRYS ER 10 MEQ PO TBCR: 10.0000 meq | EXTENDED_RELEASE_TABLET | Freq: Two times a day (BID) | ORAL | Status: DC

## 2014-07-16 ENCOUNTER — Other Ambulatory Visit: Payer: Self-pay

## 2014-07-16 MED ORDER — PRAVASTATIN SODIUM 40 MG PO TABS: 40.0000 mg | ORAL_TABLET | Freq: Every evening | ORAL | Status: DC

## 2014-08-15 ENCOUNTER — Other Ambulatory Visit: Payer: Self-pay | Admitting: Internal Medicine

## 2014-09-11 ENCOUNTER — Encounter: Payer: Self-pay | Admitting: Physician Assistant

## 2014-09-11 ENCOUNTER — Ambulatory Visit: Payer: BLUE CROSS/BLUE SHIELD | Admitting: Physician Assistant

## 2014-09-11 VITALS — BP 110/70 | HR 56 | Temp 98.7°F | Resp 16 | Ht 63.0 in | Wt 187.0 lb

## 2014-09-11 DIAGNOSIS — E1122 Type 2 diabetes mellitus with diabetic chronic kidney disease: Secondary | ICD-10-CM

## 2014-09-11 DIAGNOSIS — E559 Vitamin D deficiency, unspecified: Secondary | ICD-10-CM

## 2014-09-11 DIAGNOSIS — Z79899 Other long term (current) drug therapy: Secondary | ICD-10-CM

## 2014-09-11 DIAGNOSIS — I10 Essential (primary) hypertension: Secondary | ICD-10-CM

## 2014-09-11 DIAGNOSIS — E782 Mixed hyperlipidemia: Secondary | ICD-10-CM

## 2014-09-11 NOTE — Progress Notes (Signed)
Assessment and Plan:  Hypertension: Continue medication but try to stop HCTZ and potassium pills, follow up 1 month recheck BMP, monitor blood pressure at home. Continue DASH diet.  Reminder to go to the ER if any CP, SOB, nausea, dizziness, severe HA, changes vision/speech, left arm numbness and tingling and jaw pain. Cholesterol: Continue diet and exercise. Check cholesterol. Stop fenofibrate and recheck Chol 1 month.  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 Check A1C Vitamin D Def- check level and continue medications.  Obesity with co morbidities- long discussion about weight loss, diet, and exercise  Doing very well with weight loss, will try to decrease meds.  Continue diet and meds as discussed. Further disposition pending results of labs. Discussed med's effects and SE's.    HPI 64 y.o. female  presents for 3 month follow up with hypertension, hyperlipidemia, diabetes and vitamin D. Her blood pressure has been controlled at home, today their BP is BP: 110/70 mmHg She does workout. She denies chest pain, shortness of breath, dizziness.  She is on cholesterol medication, pravastatin 40 and denies myalgias. Her cholesterol is at goal. The cholesterol was:  06/02/2014: Cholesterol, Total 144; HDL Cholesterol by NMR 58; LDL (calc) 63; Triglycerides 114 She has been working on diet and exercise for Diabetes with diabetic chronic kidney disease, however with diet, exercise, medication MF/invokana she has gone from A1C 12.4 to not even in the DM range at 5.5 she is on bASA, she is on ACE/ARB, and denies  paresthesia of the feet, polydipsia, polyuria and visual disturbances. Last A1C was: 06/02/2014: Hemoglobin-A1c 5.5 Patient is on Vitamin D supplement. 06/02/2014: Vit D, 25-Hydroxy 94*  BMI is Body mass index is 33.13 kg/(m^2)., she is working on diet and exercise. Wt Readings from Last 3 Encounters:  09/11/14 187 lb (84.823 kg)  06/02/14 180 lb (81.647 kg)   03/11/14 187 lb (84.823 kg)   Current Medications:  Current Outpatient Prescriptions on File Prior to Visit  Medication Sig Dispense Refill  . atenolol (TENORMIN) 100 MG tablet TAKE 1 TABLET BY MOUTH EVERY DAY 90 tablet 99  . Cholecalciferol (VITAMIN D-3) 5000 UNITS TABS Take 2 tablets by mouth every other day.     . enalapril (VASOTEC) 10 MG tablet Take 10 mg by mouth every evening.     Marland Kitchen esomeprazole (NEXIUM) 40 MG capsule TAKE 1 CAPSULE BY MOUTH EVERY DAY 30 capsule 99  . fenofibrate micronized (LOFIBRA) 134 MG capsule TAKE ONE CAPSULE BY MOUTH EVERY DAY BEFORE BREAKFAST. 30 capsule 3  . hydrochlorothiazide (HYDRODIURIL) 25 MG tablet TAKE 1 TABLET BY MOUTH EVERY MORNING FOR FLUID 90 tablet 99  . INVOKANA 300 MG TABS TAKE 1 TABLET BY MOUTH EVERY DAY 30 tablet 5  . metFORMIN (GLUCOPHAGE-XR) 500 MG 24 hr tablet TAKE 1 TABLET BY MOUTH WITH BREAKFAST 1 TABLET BY MOUTH WITH LUNCH AND 2 TABLETS WITH DINNER 360 tablet PRN  . potassium chloride (K-DUR,KLOR-CON) 10 MEQ tablet Take 1 tablet (10 mEq total) by mouth 2 (two) times daily. 60 tablet 3  . pravastatin (PRAVACHOL) 40 MG tablet Take 1 tablet (40 mg total) by mouth every evening. 90 tablet 3   No current facility-administered medications on file prior to visit.   Medical History:  Past Medical History  Diagnosis Date  . Hemorrhoids   . Arthritis   . HTN (hypertension)   . CAD (coronary artery disease)   . Coronary disease     nonobstructive, history of  . Hypercholesterolemia   .  Gastric reflux     history of  . Diabetes mellitus without complication   . Myocardial infarction 2004    slight mi x 1   Allergies: No Known Allergies   Review of Systems:  Review of Systems  Constitutional: Negative.   HENT: Negative.   Eyes: Negative.   Respiratory: Negative.   Cardiovascular: Negative.   Gastrointestinal: Negative.   Genitourinary: Negative.   Musculoskeletal: Negative.   Skin: Negative.   Neurological: Negative.    Endo/Heme/Allergies: Negative.   Psychiatric/Behavioral: Negative.     Family history- Review and unchanged Social history- Review and unchanged Physical Exam: BP 110/70 mmHg  Pulse 56  Temp(Src) 98.7 F (37.1 C)  Resp 16  Wt 187 lb (84.823 kg) Wt Readings from Last 3 Encounters:  09/11/14 187 lb (84.823 kg)  06/02/14 180 lb (81.647 kg)  03/11/14 187 lb (84.823 kg)   General Appearance: Well nourished, in no apparent distress. Eyes: PERRLA, EOMs, conjunctiva no swelling or erythema Sinuses: No Frontal/maxillary tenderness ENT/Mouth: Ext aud canals clear, TMs without erythema, bulging. No erythema, swelling, or exudate on post pharynx.  Tonsils not swollen or erythematous. Hearing normal.  Neck: Supple, thyroid normal.  Respiratory: Respiratory effort normal, BS equal bilaterally without rales, rhonchi, wheezing or stridor.  Cardio: RRR with no MRGs. Brisk peripheral pulses without edema.  Abdomen: Soft, + BS.  Non tender, no guarding, rebound, hernias, masses. Lymphatics: Non tender without lymphadenopathy.  Musculoskeletal: Full ROM, 5/5 strength, normal gait.  Skin: Warm, dry without rashes, lesions, ecchymosis.  Neuro: Cranial nerves intact. No cerebellar symptoms. Sensation intact.  Psych: Awake and oriented X 3, normal affect, Insight and Judgment appropriate.    Vicie Mutters, PA-C 4:12 PM [facility]

## 2014-09-11 NOTE — Patient Instructions (Addendum)
1) stop the fluid pill, this is making you pee out your potassium which is why you have to take potassium pills so if you take the potassium pills. If you stop the HCTZ then you can likely stop the potassium pills. Message me if you get swelling in your legs.   2) Can cut Metformin down to 2 a day from 4 a day.   3) Stop the fenofibrate  4) Heart rate is low normal, this is from your atenolol. After you have stopped the HCTZ for 1 month and monitored your blood pressure, message me in mychart with your BP numbers, we may be able to cut your atenolol in half.   5) after your next visit, we will see how your cholesterol is off the fenofibrate if it is still good we may be able to decrease pravastatin. Can try taking the pravastatin 1 pill 4 days a week. Benefiber is good for constipation/diarrhea/irritable bowel syndrome, it helps with weight loss and can help lower your bad cholesterol. Please do 1-2 TBSP in the morning in water, coffee, or tea. It can take up to a month before you can see a difference with your bowel movements. It is cheapest from [employer], [employer], [employer].   Before you even begin to attack a weight-loss plan, it pays to remember this: You are not fat. You have fat. Losing weight isn't about blame or shame; it's simply another achievement to accomplish. Dieting is like any other skill-you have to buckle down and work at it. As long as you act in a smart, reasonable way, you'll ultimately get where you want to be. Here are some weight loss pearls for you.  1. It's Not a Diet. It's a Lifestyle Thinking of a diet as something you're on and suffering through only for the short term doesn't work. To shed weight and keep it off, you need to make permanent changes to the way you eat. It's OK to indulge occasionally, of course, but if you cut calories temporarily and then revert to your old way of eating, you'll gain back the weight quicker than you can say yo-yo. Use it to lose it. Research shows  that one of the best predictors of long-term weight loss is how many pounds you drop in the first month. For that reason, nutritionists often suggest being stricter for the first two weeks of your new eating strategy to build momentum. Cut out added sugar and alcohol and avoid unrefined carbs. After that, figure out how you can reincorporate them in a way that's healthy and maintainable.  2. There's a Right Way to Exercise Working out burns calories and fat and boosts your metabolism by building muscle. But those trying to lose weight are notorious for overestimating the number of calories they burn and underestimating the amount they take in. Unfortunately, your system is biologically programmed to hold on to extra pounds and that means when you start exercising, your body senses the deficit and ramps up its hunger signals. If you're not diligent, you'll eat everything you burn and then some. Use it to lose it. Cardio gets all the exercise glory, but strength and interval training are the real heroes. They help you build lean muscle, which in turn increases your metabolism and calorie-burning ability 3. Don't Overreact to Mild Hunger Some people have a hard time losing weight because of hunger anxiety. To them, being hungry is bad-something to be avoided at all costs-so they carry snacks with them and eat when they  don't need to. Others eat because they're stressed out or bored. While you never want to get to the point of being ravenous (that's when bingeing is likely to happen), a hunger pang, a craving, or the fact that it's 3:00 p.m. should not send you racing for the vending machine or obsessing about the energy bar in your purse. Ideally, you should put off eating until your stomach is growling and it's difficult to concentrate.  Use it to lose it. When you feel the urge to eat, use the HALT method. Ask yourself, Am I really hungry? Or am I angry or anxious, lonely or bored, or tired? If you're still not  certain, try the apple test. If you're truly hungry, an apple should seem delicious; if it doesn't, something else is going on. Or you can try drinking water and making yourself busy, if you are still hungry try a healthy snack.  4. Not All Calories Are Created Equal The mechanics of weight loss are pretty simple: Take in fewer calories than you use for energy. But the kind of food you eat makes all the difference. Processed food that's high in saturated fat and refined starch or sugar can cause inflammation that disrupts the hormone signals that tell your brain you're full. The result: You eat a lot more.  Use it to lose it. Clean up your diet. Swap in whole, unprocessed foods, including vegetables, lean protein, and healthy fats that will fill you up and give you the biggest nutritional bang for your calorie buck. In a few weeks, as your brain starts receiving regular hunger and fullness signals once again, you'll notice that you feel less hungry overall and naturally start cutting back on the amount you eat.  5. Protein, Produce, and Plant-Based Fats Are Your Weight-Loss Trinity Here's why eating the three Ps regularly will help you drop pounds. Protein fills you up. You need it to build lean muscle, which keeps your metabolism humming so that you can torch more fat. People in a weight-loss program who ate double the recommended daily allowance for protein (about 110 grams for a 150-pound woman) lost 70 percent of their weight from fat, while people who ate the RDA lost only about 40 percent, one study found. Produce is packed with filling fiber. "It's very difficult to consume too many calories if you're eating a lot of vegetables. Example: Three cups of broccoli is a lot of food, yet only 93 calories. (Fruit is another story. It can be easy to overeat and can contain a lot of calories from sugar, so be sure to monitor your intake.) Plant-based fats like olive oil and those in avocados and nuts are  healthy and extra satiating.  Use it to lose it. Aim to incorporate each of the three Ps into every meal and snack. People who eat protein throughout the day are able to keep weight off, according to a study in the [place] of Clinical Nutrition. In addition to meat, poultry and seafood, good sources are beans, lentils, eggs, tofu, and yogurt. As for fat, keep portion sizes in check by measuring out salad dressing, oil, and nut butters (shoot for one to two tablespoons). Finally, eat veggies or a little fruit at every meal. People who did that consumed 308 fewer calories but didn't feel any hungrier than when they didn't eat more produce.  7. How You Eat Is As Important As What You Eat In order for your brain to register that you're full, you need  to focus on what you're eating. Sit down whenever you eat, preferably at a table. Turn off the TV or computer, put down your phone, and look at your food. Smell it. Chew slowly, and don't put another bite on your fork until you swallow. When women ate lunch this attentively, they consumed 30 percent less when snacking later than those who listened to an audiobook at lunchtime, according to a study in the [place] of Nutrition. 8. Weighing Yourself Really Works The scale provides the best evidence about whether your efforts are paying off. Seeing the numbers tick up or down or stagnate is motivation to keep going-or to rethink your approach. A 2015 study at [facility] found that daily weigh-ins helped people lose more weight, keep it off, and maintain that loss, even after two years. Use it to lose it. Step on the scale at the same time every day for the best results. If your weight shoots up several pounds from one weigh-in to the next, don't freak out. Eating a lot of salt the night before or having your period is the likely culprit. The number should return to normal in a day or two. It's a steady climb that you need to do something  about. 9. Too Much Stress and Too Little Sleep Are Your Enemies When you're tired and frazzled, your body cranks up the production of cortisol, the stress hormone that can cause carb cravings. Not getting enough sleep also boosts your levels of ghrelin, a hormone associated with hunger, while suppressing leptin, a hormone that signals fullness and satiety. People on a diet who slept only five and a half hours a night for two weeks lost 55 percent less fat and were hungrier than those who slept eight and a half hours, according to a study in the [place]. Use it to lose it. Prioritize sleep, aiming for seven hours or more a night, which research shows helps lower stress. And make sure you're getting quality zzz's. If a snoring spouse or a fidgety cat wakes you up frequently throughout the night, you may end up getting the equivalent of just four hours of sleep, according to a study from [facility]. Keep pets out of the bedroom, and use a white-noise app to drown out snoring. 10. You Will Hit a plateau-And You Can Bust Through It As you slim down, your body releases much less leptin, the fullness hormone.  If you're not strength training, start right now. Building muscle can raise your metabolism to help you overcome a plateau. To keep your body challenged and burning calories, incorporate new moves and more intense intervals into your workouts or add another sweat session to your weekly routine. Alternatively, cut an extra 100 calories or so a day from your diet. Now that you've lost weight, your body simply doesn't need as much fuel.

## 2014-09-12 LAB — CBC WITH DIFFERENTIAL/PLATELET
Basophils Absolute: 0.1 10*3/uL (ref 0.0–0.1)
Basophils Relative: 1 % (ref 0–1)
Eosinophils Absolute: 0.2 10*3/uL (ref 0.0–0.7)
Eosinophils Relative: 3 % (ref 0–5)
HEMATOCRIT: 39.4 % (ref 36.0–46.0)
Hemoglobin: 13.2 g/dL (ref 12.0–15.0)
Lymphocytes Relative: 33 % (ref 12–46)
Lymphs Abs: 2.1 10*3/uL (ref 0.7–4.0)
MCH: 27.9 pg (ref 26.0–34.0)
MCHC: 33.5 g/dL (ref 30.0–36.0)
MCV: 83.3 fL (ref 78.0–100.0)
MONOS PCT: 7 % (ref 3–12)
MPV: 11.3 fL (ref 8.6–12.4)
Monocytes Absolute: 0.5 10*3/uL (ref 0.1–1.0)
NEUTROS ABS: 3.6 10*3/uL (ref 1.7–7.7)
NEUTROS PCT: 56 % (ref 43–77)
Platelets: 288 10*3/uL (ref 150–400)
RBC: 4.73 MIL/uL (ref 3.87–5.11)
RDW: 13.6 % (ref 11.5–15.5)
WBC: 6.5 10*3/uL (ref 4.0–10.5)

## 2014-09-12 LAB — BASIC METABOLIC PANEL WITH GFR
BUN: 20 mg/dL (ref 6–23)
CALCIUM: 10 mg/dL (ref 8.4–10.5)
CO2: 27 meq/L (ref 19–32)
CREATININE: 0.78 mg/dL (ref 0.50–1.10)
Chloride: 97 mEq/L (ref 96–112)
GFR, Est African American: >89 mL/min
GFR, Est Non African American: 81 mL/min
Glucose, Bld: 77 mg/dL (ref 70–99)
Potassium: 4.1 mEq/L (ref 3.5–5.3)
Sodium: 134 mEq/L — ABNORMAL LOW (ref 135–145)

## 2014-09-12 LAB — LIPID PANEL
CHOL/HDL RATIO: 2.9 ratio
CHOLESTEROL: 173 mg/dL (ref 0–200)
HDL: 59 mg/dL (ref >39)
LDL Cholesterol: 85 mg/dL (ref 0–99)
Triglycerides: 144 mg/dL (ref <150)
VLDL: 29 mg/dL (ref 0–40)

## 2014-09-12 LAB — VITAMIN D 25 HYDROXY (VIT D DEFICIENCY, FRACTURES): VIT D 25 HYDROXY: 52 ng/mL (ref 30–100)

## 2014-09-12 LAB — HEPATIC FUNCTION PANEL
ALK PHOS: 53 U/L (ref 39–117)
ALT: 26 U/L (ref 0–35)
AST: 18 U/L (ref 0–37)
Albumin: 4.6 g/dL (ref 3.5–5.2)
BILIRUBIN DIRECT: 0.1 mg/dL (ref 0.0–0.3)
Indirect Bilirubin: 0.4 mg/dL (ref 0.2–1.2)
TOTAL PROTEIN: 7 g/dL (ref 6.0–8.3)
Total Bilirubin: 0.5 mg/dL (ref 0.2–1.2)

## 2014-09-12 LAB — TSH: TSH: 2.652 u[IU]/mL (ref 0.350–4.500)

## 2014-09-12 LAB — MAGNESIUM: Magnesium: 1.9 mg/dL (ref 1.5–2.5)

## 2014-09-12 LAB — HEMOGLOBIN A1C
Hgb A1c MFr Bld: 5.4 % (ref <5.7)
MEAN PLASMA GLUCOSE: 108 mg/dL (ref <117)

## 2014-10-14 ENCOUNTER — Ambulatory Visit: Payer: BLUE CROSS/BLUE SHIELD | Admitting: Emergency Medicine

## 2014-10-14 ENCOUNTER — Encounter: Payer: Self-pay | Admitting: Emergency Medicine

## 2014-10-14 VITALS — BP 124/68 | HR 60 | Temp 98.2°F | Resp 16 | Ht 63.0 in | Wt 185.0 lb

## 2014-10-14 DIAGNOSIS — E871 Hypo-osmolality and hyponatremia: Secondary | ICD-10-CM

## 2014-10-14 DIAGNOSIS — Z79899 Other long term (current) drug therapy: Secondary | ICD-10-CM

## 2014-10-14 DIAGNOSIS — J029 Acute pharyngitis, unspecified: Secondary | ICD-10-CM

## 2014-10-14 DIAGNOSIS — R899 Unspecified abnormal finding in specimens from other organs, systems and tissues: Secondary | ICD-10-CM

## 2014-10-14 LAB — BASIC METABOLIC PANEL WITH GFR
BUN: 16 mg/dL (ref 6–23)
CO2: 27 meq/L (ref 19–32)
Calcium: 9.9 mg/dL (ref 8.4–10.5)
Chloride: 103 mEq/L (ref 96–112)
Creat: 0.66 mg/dL (ref 0.50–1.10)
GFR, Est African American: >89 mL/min
GFR, Est Non African American: >89 mL/min
Glucose, Bld: 107 mg/dL — ABNORMAL HIGH (ref 70–99)
Potassium: 4.5 mEq/L (ref 3.5–5.3)
SODIUM: 139 meq/L (ref 135–145)

## 2014-10-14 LAB — LIPID PANEL
CHOLESTEROL: 182 mg/dL (ref 0–200)
HDL: 56 mg/dL (ref >=46)
LDL CALC: 86 mg/dL (ref 0–99)
TRIGLYCERIDES: 199 mg/dL — AB (ref <150)
Total CHOL/HDL Ratio: 3.3 Ratio
VLDL: 40 mg/dL (ref 0–40)

## 2014-10-14 MED ORDER — MOMETASONE FUROATE 50 MCG/ACT NA SUSP: 2.0000 | Freq: Every day | NASAL | Status: DC

## 2014-10-14 MED ORDER — AZITHROMYCIN 250 MG PO TABS: ORAL_TABLET | ORAL | Status: AC

## 2014-10-14 NOTE — Progress Notes (Signed)
Subjective:    Patient ID: [name], female    DOB: [birth year], 64 y.o.   MRN: [id number]  HPI Comments: 64 yo WF ST over 1 week. She has had increased left side ear pain and facial discomfort. She has had increased congestion. She has tried Mucinex without relief.    She is feeling great with RX HTN/ CHOL changes and notes BP 90-110/ 60-70. She has continued to lose weight, eat healthy and exercise routinely. She needs recheck of labs to ensure stability  Lab Results      Component                Value               Date                      WBC                      6.5                 09/11/2014                HGB                      13.2                09/11/2014                HCT                      39.4                09/11/2014                PLT                      288                 09/11/2014                GLUCOSE                  77                  09/11/2014                CHOL                     173                 09/11/2014                TRIG                     144                 09/11/2014                HDL                      59                  09/11/2014                LDLCALC  85                  09/11/2014                ALT                      26                  09/11/2014                AST                      18                  09/11/2014                NA                       134*                09/11/2014                K                        4.1                 09/11/2014                CL                       97                  09/11/2014                CREATININE               0.78                09/11/2014                BUN                      20                  09/11/2014                CO2                      27                  09/11/2014                TSH                      2.652               09/11/2014                HGBA1C                   5.4                 09/11/2014                MICROALBUR                0.3  06/02/2014             Sore Throat  Associated symptoms include congestion, coughing and ear pain.  Otalgia  Associated symptoms include coughing and a sore throat.     Medication List       This list is accurate as of: 10/14/14  8:56 AM.  Always use your most recent med list.               atenolol 100 MG tablet  Commonly known as:  TENORMIN  TAKE 1 TABLET BY MOUTH EVERY DAY     enalapril 10 MG tablet  Commonly known as:  VASOTEC  Take 10 mg by mouth every evening.     esomeprazole 40 MG capsule  Commonly known as:  NEXIUM  TAKE 1 CAPSULE BY MOUTH EVERY DAY     INVOKANA 300 MG Tabs tablet  Generic drug:  canagliflozin  TAKE 1 TABLET BY MOUTH EVERY DAY     metFORMIN 500 MG 24 hr tablet  Commonly known as:  GLUCOPHAGE-XR  TAKE 1 TABLET BY MOUTH WITH BREAKFAST 1 TABLET BY MOUTH WITH LUNCH AND 2 TABLETS WITH DINNER     pravastatin 40 MG tablet  Commonly known as:  PRAVACHOL  Take 1 tablet (40 mg total) by mouth every evening.     Vitamin D-3 5000 UNITS Tabs  Take 2 tablets by mouth every other day.       No Known Allergies  Past Medical History  Diagnosis Date  . Hemorrhoids   . Arthritis   . HTN (hypertension)   . CAD (coronary artery disease)   . Coronary disease     nonobstructive, history of  . Hypercholesterolemia   . Gastric reflux     history of  . Diabetes mellitus without complication   . Myocardial infarction 2004    slight mi x 1      Review of Systems  Constitutional: Negative for fever.  HENT: Positive for congestion, ear pain and sore throat.   Respiratory: Positive for cough.   All other systems reviewed and are negative.  BP 124/68 mmHg  Pulse 60  Temp(Src) 98.2 F (36.8 C) (Temporal)  Resp 16  Ht 5\' 3"  (1.6 m)  Wt 185 lb (83.915 kg)  BMI 32.78 kg/m2     Objective:   Physical Exam  Constitutional: She appears well-developed and well-nourished.  HENT:  Head: Normocephalic and atraumatic.   Right Ear: External ear normal.  Left Ear: External ear normal.  Nose: Nose normal.  Mouth/Throat: Oropharynx is clear and moist.  Post Pharynx with erythema, bilateral TM yellow/ cloudy  Eyes: Conjunctivae and EOM are normal.  Neck: Normal range of motion.  Cardiovascular: Normal rate, regular rhythm, normal heart sounds and intact distal pulses.   Pulmonary/Chest: Effort normal and breath sounds normal.  Musculoskeletal: Normal range of motion.  Lymphadenopathy:    She has no cervical adenopathy.  Neurological: She is alert.  Skin: Skin is warm and dry. No rash noted.  Psychiatric: She has a normal mood and affect. Judgment normal.  Nursing note and vitals reviewed.         Assessment & Plan:  1. Pharyngitis-Warm salt water gargles daily. 1 tsp liquid benadryl + 1 tsp liquid Maalox, MIX/ GARGLE/ SPIT as needed for pain, ZPAK AD. w/c if SX increase or ER.   2. Allergic rhinitis- Add nasonex 1 sp each nostril QD SX #1, increase H2o, allergy hygiene explained.   3. RX HTN and CHOL change-  recheck labs, continue AD

## 2014-10-14 NOTE — Patient Instructions (Signed)
Warm salt water gargles daily. 1 tsp liquid benadryl + 1 tsp liquid Maalox, MIX/ GARGLE/ SPIT as needed for pain   Sore Throat A sore throat is a painful, burning, sore, or scratchy feeling of the throat. There may be pain or tenderness when swallowing or talking. You may have other symptoms with a sore throat. These include coughing, sneezing, fever, or a swollen neck. A sore throat is often the first sign of another sickness. These sicknesses may include a cold, flu, strep throat, or an infection called mono. Most sore throats go away without medical treatment.  HOME CARE   Only take medicine as told by your doctor.  Drink enough fluids to keep your pee (urine) clear or pale yellow.  Rest as needed.  Try using throat sprays, lozenges, or suck on hard candy (if older than 4 years or as told).  Sip warm liquids, such as broth, herbal tea, or warm water with honey. Try sucking on frozen ice pops or drinking cold liquids.  Rinse the mouth (gargle) with salt water. Mix 1 teaspoon salt with 8 ounces of water.  Do not smoke. Avoid being around others when they are smoking.  Put a humidifier in your bedroom at night to moisten the air. You can also turn on a hot shower and sit in the bathroom for 5-10 minutes. Be sure the bathroom door is closed. GET HELP RIGHT AWAY IF:   You have trouble breathing.  You cannot swallow fluids, soft foods, or your spit (saliva).  You have more puffiness (swelling) in the throat.  Your sore throat does not get better in 7 days.  You feel sick to your stomach (nauseous) and throw up (vomit).  You have a fever or lasting symptoms for more than 2-3 days.  You have a fever and your symptoms suddenly get worse. MAKE SURE YOU:   Understand these instructions.  Will watch your condition.  Will get help right away if you are not doing well or get worse. Document Released: 05/10/2008 Document Revised: 04/25/2012 Document Reviewed: 04/08/2012 [facility]  Patient Information 2015 [place], [place]. This information is not intended to replace advice given to you by your health care provider. Make sure you discuss any questions you have with your health care provider.

## 2014-10-27 ENCOUNTER — Other Ambulatory Visit: Payer: Self-pay | Admitting: Physician Assistant

## 2014-11-14 ENCOUNTER — Encounter: Payer: Self-pay | Admitting: Gastroenterology

## 2014-12-01 ENCOUNTER — Encounter: Payer: Self-pay | Admitting: Gastroenterology

## 2014-12-02 ENCOUNTER — Other Ambulatory Visit: Payer: Self-pay | Admitting: Internal Medicine

## 2014-12-11 ENCOUNTER — Other Ambulatory Visit: Payer: Self-pay | Admitting: *Deleted

## 2014-12-11 MED ORDER — PANTOPRAZOLE SODIUM 40 MG PO TBEC: 40.0000 mg | DELAYED_RELEASE_TABLET | Freq: Every day | ORAL | Status: DC

## 2014-12-16 ENCOUNTER — Ambulatory Visit: Payer: BLUE CROSS/BLUE SHIELD | Admitting: Internal Medicine

## 2014-12-16 ENCOUNTER — Encounter: Payer: Self-pay | Admitting: Internal Medicine

## 2014-12-16 VITALS — BP 136/80 | HR 60 | Temp 97.3°F | Resp 16 | Ht 63.0 in | Wt 198.4 lb

## 2014-12-16 DIAGNOSIS — E782 Mixed hyperlipidemia: Secondary | ICD-10-CM

## 2014-12-16 DIAGNOSIS — E559 Vitamin D deficiency, unspecified: Secondary | ICD-10-CM

## 2014-12-16 DIAGNOSIS — I10 Essential (primary) hypertension: Secondary | ICD-10-CM

## 2014-12-16 DIAGNOSIS — E1122 Type 2 diabetes mellitus with diabetic chronic kidney disease: Secondary | ICD-10-CM

## 2014-12-16 DIAGNOSIS — Z79899 Other long term (current) drug therapy: Secondary | ICD-10-CM

## 2014-12-16 LAB — CBC WITH DIFFERENTIAL/PLATELET
BASOS PCT: 1 % (ref 0–1)
Basophils Absolute: 0.1 10*3/uL (ref 0.0–0.1)
Eosinophils Absolute: 0.2 10*3/uL (ref 0.0–0.7)
Eosinophils Relative: 3 % (ref 0–5)
HCT: 39.6 % (ref 36.0–46.0)
Hemoglobin: 13.2 g/dL (ref 12.0–15.0)
LYMPHS PCT: 38 % (ref 12–46)
Lymphs Abs: 2.4 10*3/uL (ref 0.7–4.0)
MCH: 28.1 pg (ref 26.0–34.0)
MCHC: 33.3 g/dL (ref 30.0–36.0)
MCV: 84.3 fL (ref 78.0–100.0)
MONO ABS: 0.4 10*3/uL (ref 0.1–1.0)
MPV: 11.4 fL (ref 8.6–12.4)
Monocytes Relative: 7 % (ref 3–12)
Neutro Abs: 3.2 10*3/uL (ref 1.7–7.7)
Neutrophils Relative %: 51 % (ref 43–77)
PLATELETS: 222 10*3/uL (ref 150–400)
RBC: 4.7 MIL/uL (ref 3.87–5.11)
RDW: 13.7 % (ref 11.5–15.5)
WBC: 6.2 10*3/uL (ref 4.0–10.5)

## 2014-12-16 NOTE — Progress Notes (Signed)
Patient ID: [name], female   DOB: July 01, 1951, 64 y.o.   MRN: [id number]  Assessment and Plan:  Hypertension:  -Continue medication -monitor blood pressure at home. -Continue DASH diet -Reminder to go to the ER if any CP, SOB, nausea, dizziness, severe HA, changes vision/speech, left arm numbness and tingling and jaw pain.  Cholesterol - Continue diet and exercise -Check cholesterol.   Diabetes with diabetic chronic kidney disease -Continue diet and exercise.  -Check A1C  Vitamin D Def -check level -continue medications.   GERD -discussed transitioning to pepcid or zantac  Continue diet and meds as discussed. Further disposition pending results of labs. Discussed med's effects and SE's.    HPI 64 y.o. female  presents for 3 month follow up with hypertension, hyperlipidemia, diabetes and vitamin D deficiency.   Her blood pressure has been controlled at home, today their BP is BP: 136/80 mmHg.She does not workout consistently. She denies chest pain, shortness of breath, dizziness.   She is on cholesterol medication and denies myalgias. Her cholesterol is at goal. The cholesterol was:  10/14/2014: Cholesterol, Total 182; HDL-C 56; LDL (calc) 86; Triglycerides 199*   She has been working on diet and exercise for diabetes with diabetic chronic kidney disease, she is on bASA, she is on ACE/ARB, and denies  foot ulcerations, increased appetite, nausea, paresthesia of the feet, polydipsia, polyuria, visual disturbances, vomiting and weight loss. Last A1C was: 09/11/2014: Hemoglobin-A1c 5.4.  Last time she checked it it was 106 in the morning.   Patient is on Vitamin D supplement. 09/11/2014: VITD 52    Current Medications:  Current Outpatient Prescriptions on File Prior to Visit  Medication Sig Dispense Refill  . atenolol (TENORMIN) 100 MG tablet TAKE 1 TABLET BY MOUTH EVERY DAY 90 tablet 99  . Cholecalciferol (VITAMIN D-3) 5000 UNITS TABS Take 2 tablets by mouth every other  day.     . enalapril (VASOTEC) 20 MG tablet TAKE 1 TABLET BY MOUTH EVERY DAY. 90 tablet 0  . INVOKANA 300 MG TABS tablet TAKE 1 TABLET BY MOUTH EVERY DAY 30 tablet 3  . metFORMIN (GLUCOPHAGE-XR) 500 MG 24 hr tablet TAKE 1 TABLET BY MOUTH WITH BREAKFAST 1 TABLET BY MOUTH WITH LUNCH AND 2 TABLETS WITH DINNER (Patient taking differently: TAKE 1 TABLET BY MOUTH BID) 360 tablet PRN  . mometasone (NASONEX) 50 MCG/ACT nasal spray Place 2 sprays into the nose daily. 17 g 0  . pantoprazole (PROTONIX) 40 MG tablet Take 1 tablet (40 mg total) by mouth daily. 30 tablet 6  . pravastatin (PRAVACHOL) 40 MG tablet Take 1 tablet (40 mg total) by mouth every evening. 90 tablet 3   No current facility-administered medications on file prior to visit.   Medical History:  Past Medical History  Diagnosis Date  . Hemorrhoids   . Arthritis   . HTN (hypertension)   . CAD (coronary artery disease)   . Coronary disease     nonobstructive, history of  . Hypercholesterolemia   . Gastric reflux     history of  . Diabetes mellitus without complication   . Myocardial infarction 2004    slight mi x 1   Allergies: No Known Allergies   Review of Systems:  Review of Systems  Constitutional: Negative for fever, chills and malaise/fatigue.  HENT: Negative for congestion, ear pain, nosebleeds and sore throat.   Respiratory: Negative for cough, shortness of breath and wheezing.   Cardiovascular: Negative for chest pain and leg swelling.  Gastrointestinal:  Negative for heartburn, nausea, vomiting, abdominal pain, diarrhea, constipation, blood in stool and melena.  Genitourinary: Negative for dysuria, urgency and frequency.  Skin: Negative.   Neurological: Negative for dizziness, sensory change, loss of consciousness and headaches.  Psychiatric/Behavioral: Negative for depression. The patient is not nervous/anxious and does not have insomnia.     Family history- Review and unchanged  Social history- Review and  unchanged  Physical Exam: BP 136/80 mmHg  Pulse 60  Temp(Src) 97.3 F (36.3 C)  Resp 16  Ht 5\' 3"  (1.6 m)  Wt 198 lb 6.4 oz (89.994 kg)  BMI 35.15 kg/m2 Wt Readings from Last 3 Encounters:  12/16/14 198 lb 6.4 oz (89.994 kg)  10/14/14 185 lb (83.915 kg)  09/11/14 187 lb (84.823 kg)   General Appearance: Well nourished well developed, non-toxic appearing, in no apparent distress. Eyes: PERRLA, EOMs, conjunctiva no swelling or erythema ENT/Mouth: Ear canals clear with no erythema, swelling, or discharge.  TMs normal bilaterally, oropharynx clear, moist, with no exudate.   Neck: Supple, thyroid normal, no JVD, no cervical adenopathy.  Respiratory: Respiratory effort normal, breath sounds clear A&P, no wheeze, rhonchi or rales noted.  No retractions, no accessory muscle usage Cardio: RRR with no MRGs. No noted edema.  Abdomen: Obese, Soft, + BS.  Non tender, no guarding, rebound, hernias, masses. Musculoskeletal: Full ROM, 5/5 strength, Normal gait Skin: Warm, dry without rashes, lesions, ecchymosis.  Neuro: Awake and oriented X 3, Cranial nerves intact. No cerebellar symptoms.  Psych: normal affect, Insight and Judgment appropriate.    FORCUCCI, Caley Ciaramitaro, PA-C 5:24 PM [facility]

## 2014-12-16 NOTE — Patient Instructions (Signed)
GETTING OFF OF PPI's    Nexium/protonix/prilosec/Omeprazole/Dexilant/Aciphex are called PPI's, they are great at healing your stomach but should only be taken for a short period of time.     Recent studies have shown that taken for a long time they  can increase the risk of osteoporosis (weakening of your bones), pneumonia, low magnesium, restless legs, Cdiff (infection that causes diarrhea), DEMENTIA and most recently kidney damage / disease / insufficiency.     Due to this information we want to try to stop the PPI but if you try to stop it abruptly this can cause rebound acid and worsening symptoms.   So this is how we want you to get off the PPI:  - Start taking the nexium/protonix/prilosec/PPI  every other day with  zantac (ranitidine) 2 x a day for 2-4 weeks  - then decrease the PPI to every 3 days while taking the zantac (ranitidine) twice a day the other  days for 2-4  Weeks  - then you can try the zantac (ranitidine) once at night or up to 2 x day as needed.  - you can continue on this once at night or stop all together  - Avoid alcohol, spicy foods, NSAIDS (aleve, ibuprofen) at this time. See foods below.   +++++++++++++++++++++++++++++++++++++++++++  Food Choices for Gastroesophageal Reflux Disease  When you have gastroesophageal reflux disease (GERD), the foods you eat and your eating habits are very important. Choosing the right foods can help ease the discomfort of GERD. WHAT GENERAL GUIDELINES DO I NEED TO FOLLOW?  Choose fruits, vegetables, whole grains, low-fat dairy products, and low-fat meat, fish, and poultry.  Limit fats such as oils, salad dressings, butter, nuts, and avocado.  Keep a food diary to identify foods that cause symptoms.  Avoid foods that cause reflux. These may be different for different people.  Eat frequent small meals instead of three large meals each day.  Eat your meals slowly, in a relaxed setting.  Limit fried foods.  Cook foods  using methods other than frying.  Avoid drinking alcohol.  Avoid drinking large amounts of liquids with your meals.  Avoid bending over or lying down until 2-3 hours after eating.   WHAT FOODS ARE NOT RECOMMENDED? The following are some foods and drinks that may worsen your symptoms:  Vegetables Tomatoes. Tomato juice. Tomato and spaghetti sauce. Chili peppers. Onion and garlic. Horseradish. Fruits Oranges, grapefruit, and lemon (fruit and juice). Meats High-fat meats, fish, and poultry. This includes hot dogs, ribs, ham, sausage, salami, and bacon. Dairy Whole milk and chocolate milk. Sour cream. Cream. Butter. Ice cream. Cream cheese.  Beverages Coffee and tea, with or without caffeine. Carbonated beverages or energy drinks. Condiments Hot sauce. Barbecue sauce.  Sweets/Desserts Chocolate and cocoa. Donuts. Peppermint and spearmint. Fats and Oils High-fat foods, including French fries and potato chips. Other Vinegar. Strong spices, such as black pepper, white pepper, red pepper, cayenne, curry powder, cloves, ginger, and chili powder. Nexium/protonix/prilosec are called PPI's, they are great at healing your stomach but should only be taken for a short period of time.    

## 2014-12-17 LAB — HEPATIC FUNCTION PANEL
ALT: 39 U/L — ABNORMAL HIGH (ref 0–35)
AST: 20 U/L (ref 0–37)
Albumin: 4.3 g/dL (ref 3.5–5.2)
Alkaline Phosphatase: 73 U/L (ref 39–117)
BILIRUBIN TOTAL: 0.5 mg/dL (ref 0.2–1.2)
Bilirubin, Direct: 0.1 mg/dL (ref 0.0–0.3)
Indirect Bilirubin: 0.4 mg/dL (ref 0.2–1.2)
Total Protein: 6.4 g/dL (ref 6.0–8.3)

## 2014-12-17 LAB — LIPID PANEL
CHOLESTEROL: 155 mg/dL (ref 0–200)
HDL: 51 mg/dL (ref >=46)
LDL CALC: 81 mg/dL (ref 0–99)
Total CHOL/HDL Ratio: 3 Ratio
Triglycerides: 115 mg/dL (ref <150)
VLDL: 23 mg/dL (ref 0–40)

## 2014-12-17 LAB — BASIC METABOLIC PANEL WITH GFR
BUN: 18 mg/dL (ref 6–23)
CHLORIDE: 106 meq/L (ref 96–112)
CO2: 28 meq/L (ref 19–32)
Calcium: 9.5 mg/dL (ref 8.4–10.5)
Creat: 0.69 mg/dL (ref 0.50–1.10)
GFR, Est African American: >89 mL/min
GFR, Est Non African American: >89 mL/min
GLUCOSE: 86 mg/dL (ref 70–99)
Potassium: 4 mEq/L (ref 3.5–5.3)
SODIUM: 140 meq/L (ref 135–145)

## 2014-12-17 LAB — HEMOGLOBIN A1C
Hgb A1c MFr Bld: 5.6 % (ref <5.7)
Mean Plasma Glucose: 114 mg/dL (ref <117)

## 2014-12-17 LAB — MAGNESIUM: Magnesium: 1.8 mg/dL (ref 1.5–2.5)

## 2014-12-17 LAB — INSULIN, RANDOM: Insulin: 10.3 u[IU]/mL (ref 2.0–19.6)

## 2014-12-22 LAB — VITAMIN D 1,25 DIHYDROXY
Vitamin D 1, 25 (OH)2 Total: 52 pg/mL (ref 18–72)
Vitamin D2 1, 25 (OH)2: <8 pg/mL
Vitamin D3 1, 25 (OH)2: 52 pg/mL

## 2015-01-09 ENCOUNTER — Ambulatory Visit (AMBULATORY_SURGERY_CENTER): Payer: Self-pay

## 2015-01-09 VITALS — Ht 63.5 in | Wt 197.4 lb

## 2015-01-09 DIAGNOSIS — Z800 Family history of malignant neoplasm of digestive organs: Secondary | ICD-10-CM

## 2015-01-09 MED ORDER — SUPREP BOWEL PREP KIT 17.5-3.13-1.6 GM/177ML PO SOLN: 1.0000 | Freq: Once | ORAL | Status: DC

## 2015-01-09 NOTE — Progress Notes (Signed)
No allergies to eggs or soy No past problems with anesthesia No diet/weight loss meds No home oxygen  Has email  Emmi instructions given for colonoscopy 

## 2015-01-23 ENCOUNTER — Encounter: Payer: Self-pay | Admitting: Gastroenterology

## 2015-01-23 ENCOUNTER — Ambulatory Visit (AMBULATORY_SURGERY_CENTER): Payer: BLUE CROSS/BLUE SHIELD | Admitting: Gastroenterology

## 2015-01-23 VITALS — BP 117/85 | HR 73 | Temp 96.9°F | Resp 17 | Ht 63.0 in | Wt 197.0 lb

## 2015-01-23 DIAGNOSIS — K648 Other hemorrhoids: Secondary | ICD-10-CM

## 2015-01-23 DIAGNOSIS — D125 Benign neoplasm of sigmoid colon: Secondary | ICD-10-CM | POA: Diagnosis not present

## 2015-01-23 DIAGNOSIS — Z1211 Encounter for screening for malignant neoplasm of colon: Secondary | ICD-10-CM | POA: Diagnosis present

## 2015-01-23 DIAGNOSIS — K635 Polyp of colon: Secondary | ICD-10-CM

## 2015-01-23 DIAGNOSIS — K5730 Diverticulosis of large intestine without perforation or abscess without bleeding: Secondary | ICD-10-CM

## 2015-01-23 LAB — GLUCOSE, CAPILLARY
GLUCOSE-CAPILLARY: 80 mg/dL (ref 65–99)
Glucose-Capillary: 71 mg/dL (ref 65–99)
Glucose-Capillary: 80 mg/dL (ref 65–99)

## 2015-01-23 MED ORDER — SODIUM CHLORIDE 0.9 % IV SOLN: 500.0000 mL | INTRAVENOUS | Status: DC

## 2015-01-23 NOTE — Progress Notes (Signed)
To recovery, report to Willis, RN, VSS 

## 2015-01-23 NOTE — Op Note (Signed)
[facility]  [address] [place], [id number]   COLONOSCOPY PROCEDURE REPORT  PATIENT: [name], [name]  MR#: [id number] BIRTHDATE: 10/25/1950 , 63  yrs. old GENDER: female ENDOSCOPIST: Inda Castle, MD REFERRED [id number] Melford Aase, M.D. PROCEDURE DATE:  01/23/2015 PROCEDURE:   Colonoscopy, screening and Colonoscopy with snare polypectomy First Screening Colonoscopy - Avg.  risk and is 50 yrs.  old or older - No.  Prior Negative Screening - Now for repeat screening. 10 or more years since last screening  History of Adenoma - Now for follow-up colonoscopy & has been > or = to 3 yrs.  N/A  Polyps removed today? Yes ASA CLASS:   Class II INDICATIONS:Colorectal Neoplasm Risk Assessment for this procedure is average risk. MEDICATIONS: Monitored anesthesia care and Propofol 300 mg IV  DESCRIPTION OF PROCEDURE:   After the risks benefits and alternatives of the procedure were thoroughly explained, informed consent was obtained.  The digital rectal exam revealed no abnormalities of the rectum.   The [id number]  endoscope was introduced through the anus and advanced to the ileum. No adverse events experienced.   The quality of the prep was (Suprep was used) excellent.  The instrument was then slowly withdrawn as the colon was fully examined. Estimated blood loss is zero unless otherwise noted in this procedure report.      COLON FINDINGS: There was mild diverticulosis noted in the sigmoid colon.   A sessile polyp measuring 3 mm in size was found in the sigmoid colon.  A polypectomy was performed using snare cautery. The resection was complete, the polyp tissue was completely retrieved and sent to histology.   Internal hemorrhoids were found. Retroflexed views revealed no abnormalities. The time to cecum = 4.0 Withdrawal time = 8.3   The scope was withdrawn and the procedure completed. COMPLICATIONS: There were no immediate complications.  ENDOSCOPIC  IMPRESSION: 1.   Mild diverticulosis was noted in the sigmoid colon 2.   Sessile polyp was found in the sigmoid colon; polypectomy was performed using snare cautery 3.   Internal hemorrhoids  RECOMMENDATIONS: If the polyp(s) removed today are proven to be adenomatous (pre-cancerous) polyps, you will need a repeat colonoscopy in 5 years.  Otherwise you should continue to follow colorectal cancer screening guidelines for "routine risk" patients with colonoscopy in 10 years.  You will receive a letter within 1-2 weeks with the results of your biopsy as well as final recommendations.  Please call my office if you have not received a letter after 3 weeks.  eSigned:  Inda Castle, MD 01/23/2015 2:25 PM   cc:   PATIENT NAME:  [name], [name] MR#: [id number]

## 2015-01-23 NOTE — Progress Notes (Signed)
No problems noted in the recovery room. maw 

## 2015-01-23 NOTE — Progress Notes (Signed)
Called to room to assist during endoscopic procedure.  Patient ID and intended procedure confirmed with present staff. Received instructions for my participation in the procedure from the performing physician.  

## 2015-01-23 NOTE — Patient Instructions (Addendum)
YOU HAD AN ENDOSCOPIC PROCEDURE TODAY AT [facility]:   Refer to the procedure report that was given to you for any specific questions about what was found during the examination.  If the procedure report does not answer your questions, please call your gastroenterologist to clarify.  If you requested that your care partner not be given the details of your procedure findings, then the procedure report has been included in a sealed envelope for you to review at your convenience later.  YOU SHOULD EXPECT: Some feelings of bloating in the abdomen. Passage of more gas than usual.  Walking can help get rid of the air that was put into your GI tract during the procedure and reduce the bloating. If you had a lower endoscopy (such as a colonoscopy or flexible sigmoidoscopy) you may notice spotting of blood in your stool or on the toilet paper. If you underwent a bowel prep for your procedure, you may not have a normal bowel movement for a few days.  Please Note:  You might notice some irritation and congestion in your nose or some drainage.  This is from the oxygen used during your procedure.  There is no need for concern and it should clear up in a day or so.  SYMPTOMS TO REPORT IMMEDIATELY:   Following lower endoscopy (colonoscopy or flexible sigmoidoscopy):  Excessive amounts of blood in the stool  Significant tenderness or worsening of abdominal pains  Swelling of the abdomen that is new, acute  Fever of 100F or higher   For urgent or emergent issues, a gastroenterologist can be reached at any hour by calling [phone].   DIET: Your first meal following the procedure should be a small meal and then it is ok to progress to your normal diet. Heavy or fried foods are harder to digest and may make you feel nauseous or bloated.  Likewise, meals heavy in dairy and vegetables can increase bloating.  Drink plenty of fluids but you should avoid alcoholic beverages for 24  hours.  ACTIVITY:  You should plan to take it easy for the rest of today and you should NOT DRIVE or use heavy machinery until tomorrow (because of the sedation medicines used during the test).    FOLLOW UP: Our staff will call the number listed on your records the next business day following your procedure to check on you and address any questions or concerns that you may have regarding the information given to you following your procedure. If we do not reach you, we will leave a message.  However, if you are feeling well and you are not experiencing any problems, there is no need to return our call.  We will assume that you have returned to your regular daily activities without incident.  If any biopsies were taken you will be contacted by phone or by letter within the next 1-3 weeks.  Please call us at [phone] if you have not heard about the biopsies in 3 weeks.    SIGNATURES/CONFIDENTIALITY: You and/or your care partner have signed paperwork which will be entered into your electronic medical record.  These signatures attest to the fact that that the information above on your After Visit Summary has been reviewed and is understood.  Full responsibility of the confidentiality of this discharge information lies with you and/or your care-partner.    Handouts were given to your care partner on polyps, diverticulosis, hemorrhoids,and a high fiber diet with liberal fluid intake. You may resume  your current medications today. Your blood sugar was 71 in the recovery room.  Juice drank.  Await biopsy results. Please call if any questions or concerns.

## 2015-01-26 ENCOUNTER — Telehealth: Payer: Self-pay | Admitting: *Deleted

## 2015-01-26 NOTE — Telephone Encounter (Signed)
No identifier, follow-up, left message

## 2015-01-28 ENCOUNTER — Encounter: Payer: Self-pay | Admitting: Gastroenterology

## 2015-03-20 ENCOUNTER — Ambulatory Visit: Payer: Self-pay | Admitting: Internal Medicine

## 2015-04-12 ENCOUNTER — Other Ambulatory Visit: Payer: Self-pay | Admitting: Internal Medicine

## 2015-04-12 DIAGNOSIS — E119 Type 2 diabetes mellitus without complications: Secondary | ICD-10-CM

## 2015-04-12 DIAGNOSIS — I10 Essential (primary) hypertension: Secondary | ICD-10-CM

## 2015-04-23 ENCOUNTER — Ambulatory Visit: Payer: Self-pay | Admitting: Internal Medicine

## 2015-04-30 ENCOUNTER — Ambulatory Visit (INDEPENDENT_AMBULATORY_CARE_PROVIDER_SITE_OTHER): Payer: Medicare PPO | Admitting: Internal Medicine

## 2015-04-30 ENCOUNTER — Encounter: Payer: Self-pay | Admitting: Internal Medicine

## 2015-04-30 VITALS — BP 126/78 | HR 60 | Temp 98.4°F | Resp 16 | Ht 63.0 in | Wt 209.0 lb

## 2015-04-30 DIAGNOSIS — Z0001 Encounter for general adult medical examination with abnormal findings: Secondary | ICD-10-CM

## 2015-04-30 DIAGNOSIS — E2839 Other primary ovarian failure: Secondary | ICD-10-CM

## 2015-04-30 DIAGNOSIS — R6889 Other general symptoms and signs: Secondary | ICD-10-CM | POA: Diagnosis not present

## 2015-04-30 DIAGNOSIS — Z1389 Encounter for screening for other disorder: Secondary | ICD-10-CM | POA: Diagnosis not present

## 2015-04-30 DIAGNOSIS — Z79899 Other long term (current) drug therapy: Secondary | ICD-10-CM

## 2015-04-30 DIAGNOSIS — Z1331 Encounter for screening for depression: Secondary | ICD-10-CM

## 2015-04-30 DIAGNOSIS — N189 Chronic kidney disease, unspecified: Secondary | ICD-10-CM | POA: Diagnosis not present

## 2015-04-30 DIAGNOSIS — I10 Essential (primary) hypertension: Secondary | ICD-10-CM | POA: Diagnosis not present

## 2015-04-30 DIAGNOSIS — M159 Polyosteoarthritis, unspecified: Secondary | ICD-10-CM

## 2015-04-30 DIAGNOSIS — E782 Mixed hyperlipidemia: Secondary | ICD-10-CM

## 2015-04-30 DIAGNOSIS — Z789 Other specified health status: Secondary | ICD-10-CM

## 2015-04-30 DIAGNOSIS — I213 ST elevation (STEMI) myocardial infarction of unspecified site: Secondary | ICD-10-CM

## 2015-04-30 DIAGNOSIS — E1122 Type 2 diabetes mellitus with diabetic chronic kidney disease: Secondary | ICD-10-CM

## 2015-04-30 DIAGNOSIS — Z23 Encounter for immunization: Secondary | ICD-10-CM | POA: Diagnosis not present

## 2015-04-30 DIAGNOSIS — Z9181 History of falling: Secondary | ICD-10-CM

## 2015-04-30 DIAGNOSIS — I5181 Takotsubo syndrome: Secondary | ICD-10-CM

## 2015-04-30 DIAGNOSIS — E559 Vitamin D deficiency, unspecified: Secondary | ICD-10-CM

## 2015-04-30 LAB — BASIC METABOLIC PANEL WITH GFR
BUN: 19 mg/dL (ref 7–25)
CO2: 25 mmol/L (ref 20–31)
CREATININE: 0.64 mg/dL (ref 0.50–0.99)
Calcium: 9.9 mg/dL (ref 8.6–10.4)
Chloride: 103 mmol/L (ref 98–110)
GFR, Est Non African American: >89 mL/min (ref >=60)
Glucose, Bld: 86 mg/dL (ref 65–99)
Potassium: 4.1 mmol/L (ref 3.5–5.3)
Sodium: 137 mmol/L (ref 135–146)

## 2015-04-30 LAB — CBC WITH DIFFERENTIAL/PLATELET
Basophils Absolute: 0.1 10*3/uL (ref 0.0–0.1)
Basophils Relative: 1 % (ref 0–1)
EOS ABS: 0.2 10*3/uL (ref 0.0–0.7)
EOS PCT: 3 % (ref 0–5)
HCT: 41.5 % (ref 36.0–46.0)
Hemoglobin: 14.4 g/dL (ref 12.0–15.0)
Lymphocytes Relative: 36 % (ref 12–46)
Lymphs Abs: 2.6 10*3/uL (ref 0.7–4.0)
MCH: 29.3 pg (ref 26.0–34.0)
MCHC: 34.7 g/dL (ref 30.0–36.0)
MCV: 84.3 fL (ref 78.0–100.0)
MONO ABS: 0.4 10*3/uL (ref 0.1–1.0)
MONOS PCT: 6 % (ref 3–12)
MPV: 10.6 fL (ref 8.6–12.4)
NEUTROS PCT: 54 % (ref 43–77)
Neutro Abs: 3.9 10*3/uL (ref 1.7–7.7)
PLATELETS: 254 10*3/uL (ref 150–400)
RBC: 4.92 MIL/uL (ref 3.87–5.11)
RDW: 14.2 % (ref 11.5–15.5)
WBC: 7.2 10*3/uL (ref 4.0–10.5)

## 2015-04-30 LAB — HEPATIC FUNCTION PANEL
ALBUMIN: 4.5 g/dL (ref 3.6–5.1)
ALT: 26 U/L (ref 6–29)
AST: 18 U/L (ref 10–35)
Alkaline Phosphatase: 63 U/L (ref 33–130)
Bilirubin, Direct: 0.1 mg/dL (ref <=0.2)
Indirect Bilirubin: 0.6 mg/dL (ref 0.2–1.2)
Total Bilirubin: 0.7 mg/dL (ref 0.2–1.2)
Total Protein: 6.7 g/dL (ref 6.1–8.1)

## 2015-04-30 LAB — LIPID PANEL
CHOL/HDL RATIO: 3.4 ratio (ref <=5.0)
Cholesterol: 174 mg/dL (ref 125–200)
HDL: 51 mg/dL (ref >=46)
LDL Cholesterol: 83 mg/dL (ref <130)
Triglycerides: 199 mg/dL — ABNORMAL HIGH (ref <150)
VLDL: 40 mg/dL — AB (ref <30)

## 2015-04-30 LAB — MAGNESIUM: Magnesium: 2 mg/dL (ref 1.5–2.5)

## 2015-04-30 NOTE — Progress Notes (Signed)
Patient ID: EVE REY, female   DOB: 1950/12/21, 64 y.o.   MRN: [id number]  MEDICARE ANNUAL WELLNESS VISIT AND FOLLOW UP  Assessment:    1. Essential hypertension -cont meds -monitor at home -DASH diet -weight loss - TSH  2. Acute ST elevation myocardial infarction (STEMI), unspecified artery -followed by cards -was recently released by cards -reduce risk factors  3. Stress-induced cardiomyopathy -followed by cards and released  4. Type 2 diabetes mellitus with diabetic chronic kidney disease -cont meds - Hemoglobin A1c - Insulin, random  5. DJD -well controlled  6. Hyperlipidemia -cont meds - Lipid panel  7. Vitamin D deficiency  - Vit D  25 hydroxy (rtn osteoporosis monitoring) - TSH  8. Medication management  - CBC with Differential/Platelet - BASIC METABOLIC PANEL WITH GFR - Hepatic function panel - Magnesium  9. Estrogen deficiency  - DG Bone Density; Future  10. Need for prophylactic vaccination and inoculation against influenza  - Flu vaccine HIGH DOSE PF (Fluzone High dose)  11. Need for prophylactic vaccination against Streptococcus pneumoniae (pneumococcus)  - Pneumococcal polysaccharide vaccine 23-valent greater than or equal to 2yo subcutaneous/IM    Over 30 minutes of exam, counseling, chart review, and critical decision making was performed  Plan:   During the course of the visit the patient was educated and counseled about appropriate screening and preventive services including:    Pneumococcal vaccine   Influenza vaccine  Td vaccine  Prevnar 13  Screening electrocardiogram  Screening mammography  Bone densitometry screening  Colorectal cancer screening  Diabetes screening  Glaucoma screening  Nutrition counseling   Advanced directives: given info/requested copies  Conditions/risks identified: Diabetes is at goal, ACE/ARB therapy: Yes.  Urinary Incontinence is not an issue: discussed non pharmacology and  pharmacology options.  Fall risk: low- discussed PT, home fall assessment, medications.    Subjective:   [name] is a 64 y.o. female who presents for Medicare Annual Wellness Visit and 3 month follow up on hypertension, prediabetes, hyperlipidemia, vitamin D def.  Date of last medicare wellness visit is unknown.   Her blood pressure has been controlled at home, today their BP is BP: 126/78 mmHg She does not workout. She denies chest pain, shortness of breath, dizziness.  She is on cholesterol medication and denies myalgias. Her cholesterol is at goal. The cholesterol last visit was:   Lab Results  Component Value Date   CHOL 155 12/16/2014   HDL 51 12/16/2014   LDLCALC 81 12/16/2014   TRIG 115 12/16/2014   CHOLHDL 3.0 12/16/2014   She has been working on diet and exercise for prediabetes, and denies foot ulcerations, hyperglycemia, hypoglycemia , increased appetite, nausea, paresthesia of the feet, polydipsia, polyuria, visual disturbances, vomiting and weight loss. Last A1C in the office was:  Lab Results  Component Value Date   HGBA1C 5.6 12/16/2014   Patient is on Vitamin D supplement. Lab Results  Component Value Date   VD25OH 52 09/11/2014      Medication Review Current Outpatient Prescriptions on File Prior to Visit  Medication Sig Dispense Refill  . aspirin EC 81 MG tablet Take 81 mg by mouth daily.    Marland Kitchen atenolol (TENORMIN) 100 MG tablet TAKE 1 TABLET BY MOUTH EVERY DAY 90 tablet 99  . Cholecalciferol (VITAMIN D-3) 5000 UNITS TABS Take 2 tablets by mouth every other day.     . enalapril (VASOTEC) 20 MG tablet TAKE 1 TABLET BY MOUTH EVERY DAY 90 tablet 1  . INVOKANA  300 MG TABS tablet TAKE 1 TABLET BY MOUTH EVERY DAY 30 tablet 5  . metFORMIN (GLUCOPHAGE) 500 MG tablet Take by mouth 2 (two) times daily with a meal.    . pantoprazole (PROTONIX) 40 MG tablet Take 1 tablet (40 mg total) by mouth daily. 30 tablet 6  . pravastatin (PRAVACHOL) 40 MG tablet Take 1  tablet (40 mg total) by mouth every evening. 90 tablet 3   No current facility-administered medications on file prior to visit.    Current Problems (verified) Patient Active Problem List   Diagnosis Date Noted  . Morbid obesity (BMI 32)  06/02/2014  . Osteoarthritis of left knee 02/25/2014  . Lateral meniscus tear 02/25/2014  . Medial meniscus tear 02/25/2014  . Hyperlipidemia 12/08/2013  . Vitamin D deficiency 12/08/2013  . Medication management 12/08/2013  . T2_NIDDM w/Stage 2 CKD (GFR 76 ml/min) 09/13/2013  . Stress-induced cardiomyopathy 11/18/2010  . Essential hypertension 10/29/2007  . Acute myocardial infarction 10/29/2007  . HEMORRHOIDS 10/29/2007  . DJD 10/29/2007    Screening Tests Immunization History  Administered Date(s) Administered  . Influenza-Unspecified 04/15/2013, 05/06/2014  . Tdap 03/11/2014  . Zoster 05/23/2012    Preventative care: Last colonoscopy: 6/16 Last mammogram: 11/15  DEXA: due, ordered  Prior vaccinations: TD or Tdap: 2015  Influenza: today  Pneumococcal: today Shingles/Zostavax: 2013  Names of Other Physician/Practitioners you currently use: 1. [facility]- here for primary care 2. Dr. Delman Cheadle, eye doctor, last visit 2015 3. Dr. Jarrett Soho , dentist, last visit 2016 Patient Care Team: [name], MD as PCP - General (Internal Medicine) Melissa Noon, OD as Referring Physician (Optometry)  Past Surgical History  Procedure Laterality Date  . Cardiac catheterization  November 25 2004    left heart, minor coronary atherosclerosis  . Angioplasty    . Cesarean section      x 3  . Cholecystectomy  36 yrs ago  . Eye surgery Bilateral dec 2010     lens replacements  . Knee arthroscopy with medial menisectomy Left 02/25/2014    Procedure: LEFT KNEE ARTHROSCOPY WITH MEDIAL AND LATERAL MENISECTOMY, MICROFRACTURE, SYNOVECTOMY SUPRA PATELLA;  Surgeon: Tobi Bastos, MD;  Location: [facility];  Service:  Orthopedics;  Laterality: Left;   Family History  Problem Relation Age of Onset  . Hypertension Mother   . COPD Mother   . Thyroid disease Mother   . Heart attack Father   . Heart disease Father   . Arthritis Sister     Rheumatoid  . Hypertension Sister   . Graves' disease Sister   . Thyroid disease Sister   . Colon cancer Maternal Grandfather    Social History  Substance Use Topics  . Smoking status: Never Smoker   . Smokeless tobacco: Never Used  . Alcohol Use: 0.0 oz/week    0 Standard drinks or equivalent per week     Comment: rarel; twice yearly    MEDICARE WELLNESS OBJECTIVES: Tobacco use: She does not smoke.  Patient is not a former smoker. If yes, counseling given Alcohol Current alcohol use: social drinker Osteoporosis: postmenopausal estrogen deficiency, History of fracture in the past year: no Fall risk: Low Risk Hearing: normal Visual acuity: normal,  does perform annual eye exam Diet: well balanced Physical activity: Current Exercise Habits:: Home exercise routine, Type of exercise: walking, Time (Minutes): 15, Intensity: Mild Cardiac risk factors: Cardiac Risk Factors include: diabetes mellitus;dyslipidemia;hypertension;obesity (BMI >30kg/m2);sedentary lifestyle Depression/mood screen:   Depression screen [facility] 2/9 04/30/2015  Decreased  Interest 0  Down, Depressed, Hopeless 0  PHQ - 2 Score 0    ADLs:  In your present state of health, do you have any difficulty performing the following activities: 04/30/2015  Hearing? N  Vision? N  Difficulty concentrating or making decisions? N  Walking or climbing stairs? N  Dressing or bathing? N  Doing errands, shopping? N  Preparing Food and eating ? N  Using the Toilet? N  In the past six months, have you accidently leaked urine? N  Do you have problems with loss of bowel control? N  Managing your Medications? N  Managing your Finances? N  Housekeeping or managing your Housekeeping? N     Cognitive  Testing  Alert? Yes  Normal Appearance?Yes  Oriented to person? Yes  Place? Yes   Time? Yes  Recall of three objects?  Yes  Can perform simple calculations? Yes  Displays appropriate judgment?Yes  Can read the correct time from a watch face?Yes  EOL planning: Does patient have an advance directive?: No Would patient like information on creating an advanced directive?: Yes - Educational materials given  Review of Systems  Constitutional: Negative for fever, chills and malaise/fatigue.  HENT: Negative for congestion, ear discharge and sore throat.   Eyes: Negative.   Respiratory: Negative for cough, shortness of breath and wheezing.   Cardiovascular: Negative for chest pain, palpitations and leg swelling.  Gastrointestinal: Negative for heartburn, diarrhea, constipation, blood in stool and melena.  Genitourinary: Negative.   Skin: Negative.   Neurological: Negative for dizziness, sensory change, loss of consciousness and headaches.  Psychiatric/Behavioral: Negative for depression. The patient is not nervous/anxious and does not have insomnia.     Objective:   Today's Vitals   04/30/15 1619  BP: 126/78  Pulse: 60  Temp: 98.4 F (36.9 C)  TempSrc: Temporal  Resp: 16  Height: 5\' 3"  (1.6 m)  Weight: 209 lb (94.802 kg)   Body mass index is 37.03 kg/(m^2).  General appearance: alert, no distress, WD/WN,  female HEENT: normocephalic, sclerae anicteric, TMs pearly, nares patent, no discharge or erythema, pharynx normal Oral cavity: MMM, no lesions Neck: supple, no lymphadenopathy, no thyromegaly, no masses Heart: RRR, normal S1, S2, no murmurs Lungs: CTA bilaterally, no wheezes, rhonchi, or rales Abdomen: +bs, soft, non tender, non distended, no masses, no hepatomegaly, no splenomegaly Musculoskeletal: nontender, no swelling, no obvious deformity Extremities: no edema, no cyanosis, no clubbing Pulses: 2+ symmetric, upper and lower extremities, normal cap refill Neurological:  alert, oriented x 3, CN2-12 intact, strength normal upper extremities and lower extremities, sensation normal throughout, DTRs 2+ throughout, no cerebellar signs, gait normal Psychiatric: normal affect, behavior normal, pleasant  Breast: defer Gyn: defer Rectal: defer   Medicare Attestation I have personally reviewed: The patient's medical and social history Their use of alcohol, tobacco or illicit drugs Their current medications and supplements The patient's functional ability including ADLs,fall risks, home safety risks, cognitive, and hearing and visual impairment Diet and physical activities Evidence for depression or mood disorders  The patient's weight, height, BMI, and visual acuity have been recorded in the chart.  I have made referrals, counseling, and provided education to the patient based on review of the above and I have provided the patient with a written personalized care plan for preventive services.     Starlyn Skeans, PA-C   04/30/2015

## 2015-05-01 LAB — TSH: TSH: 3.14 u[IU]/mL (ref 0.350–4.500)

## 2015-05-01 LAB — INSULIN, RANDOM: Insulin: 13 u[IU]/mL (ref 2.0–19.6)

## 2015-05-01 LAB — HEMOGLOBIN A1C
HEMOGLOBIN A1C: 5.6 % (ref <5.7)
MEAN PLASMA GLUCOSE: 114 mg/dL (ref <117)

## 2015-05-01 LAB — VITAMIN D 25 HYDROXY (VIT D DEFICIENCY, FRACTURES): Vit D, 25-Hydroxy: 69 ng/mL (ref 30–100)

## 2015-05-11 IMAGING — CR DG CHEST 2V
2 series · 2 of 2 positions shown · non-contrast
Comparison: February 18, 2008.

CLINICAL DATA: Hypertension.

EXAM:
CHEST  2 VIEW

[w chest pa]
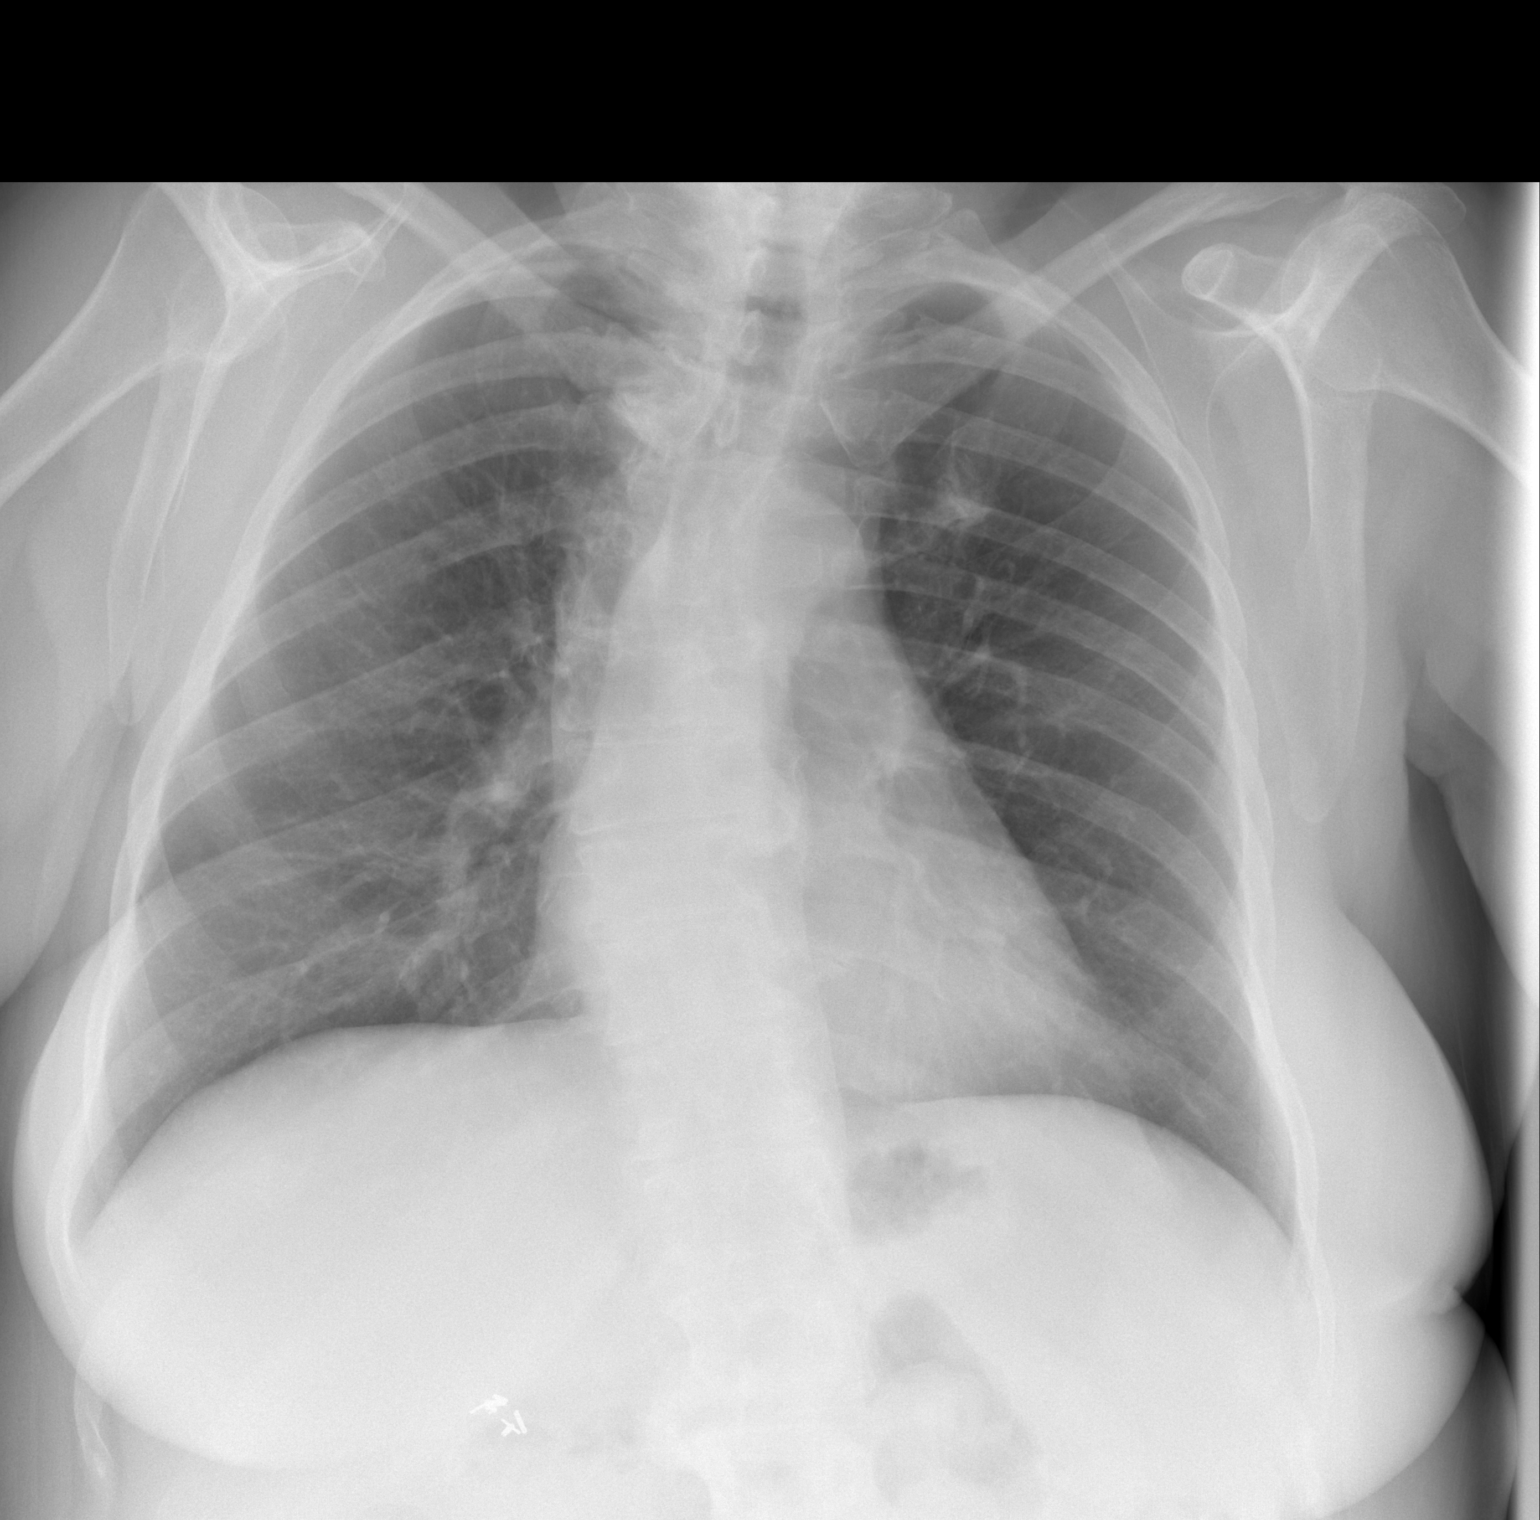

[w chest lat]
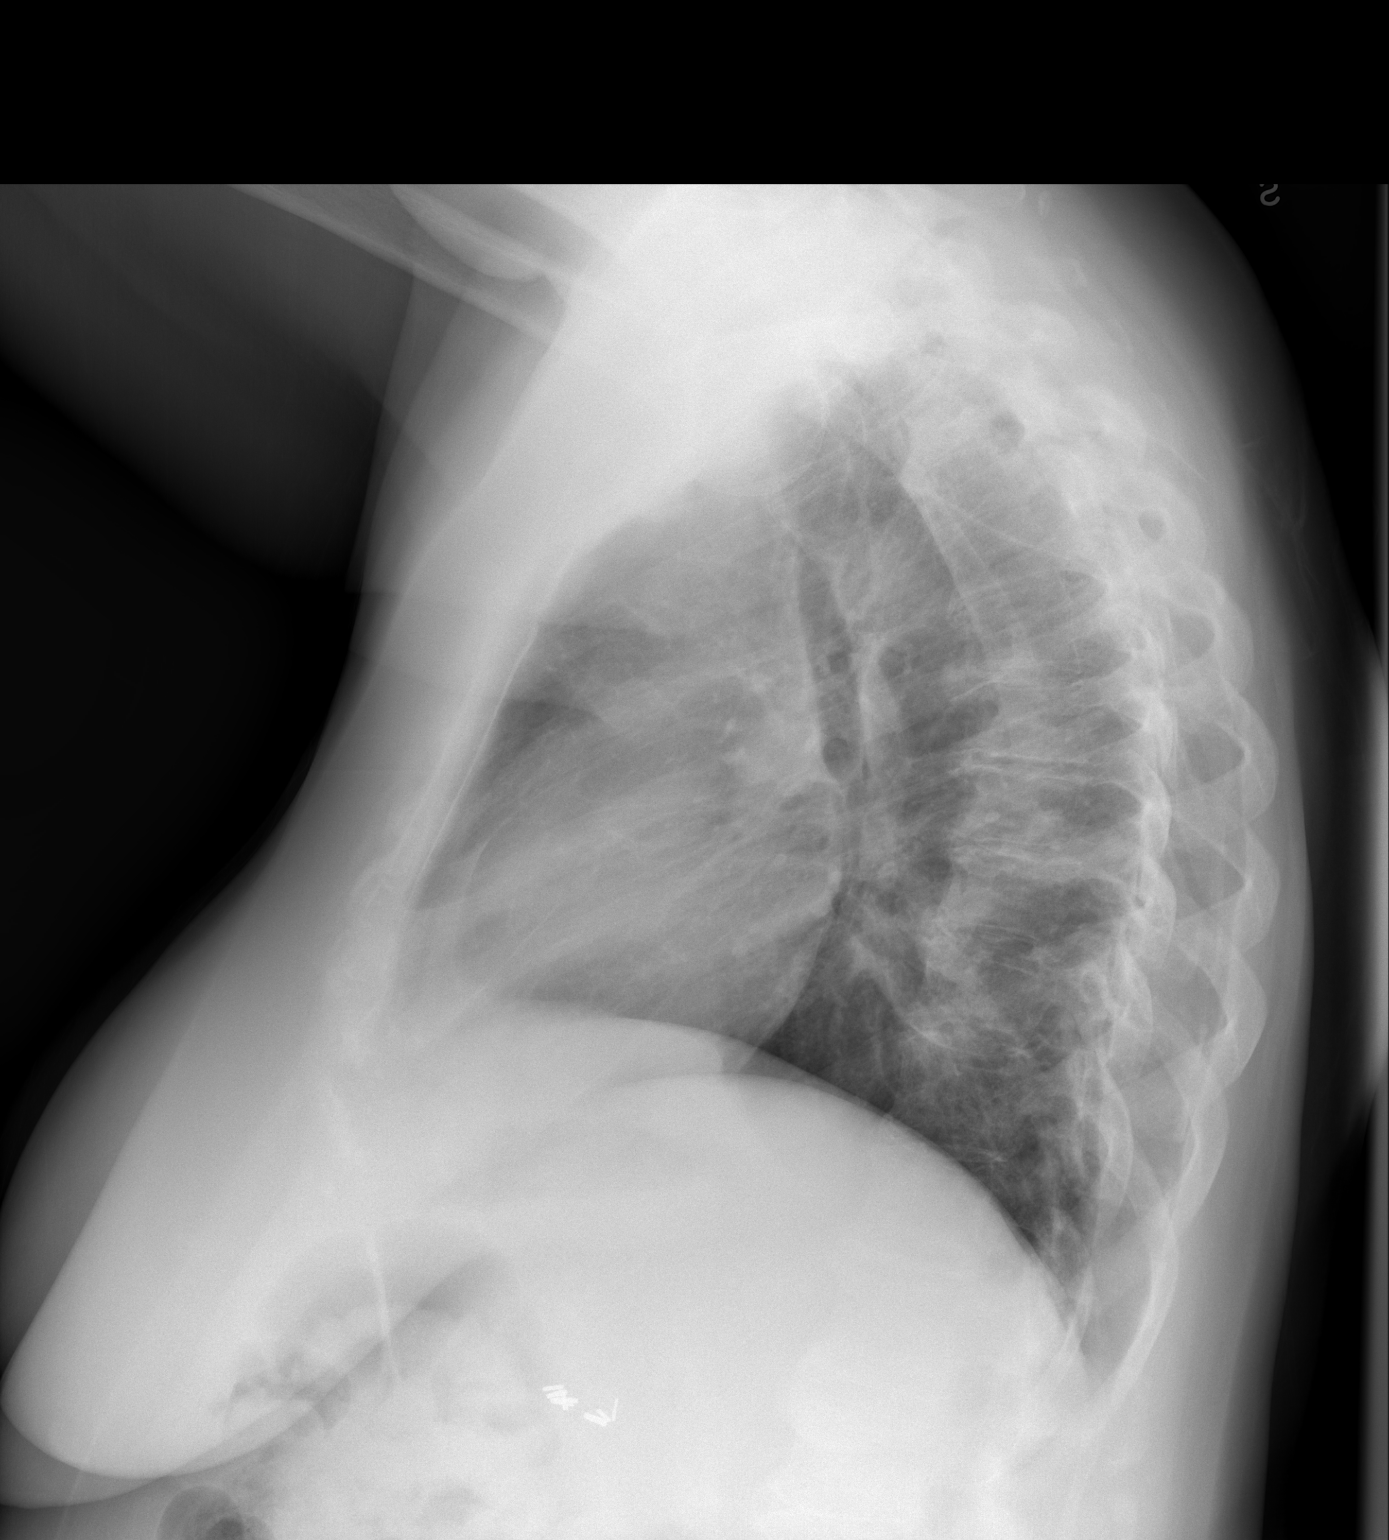

[2 of 2 positions shown; findings below may reference images not displayed]

FINDINGS: The heart size and mediastinal contours are within normal limits.
Both lungs are clear. No pneumothorax or pleural effusion is noted.
Moderate dextroscoliosis of thoracic spine is noted.
IMPRESSION: No acute cardiopulmonary abnormality seen.

## 2015-06-10 ENCOUNTER — Encounter: Payer: Self-pay | Admitting: Internal Medicine

## 2015-07-08 ENCOUNTER — Other Ambulatory Visit: Payer: Self-pay | Admitting: Internal Medicine

## 2015-07-16 ENCOUNTER — Encounter: Payer: Self-pay | Admitting: Internal Medicine

## 2015-07-16 ENCOUNTER — Ambulatory Visit (INDEPENDENT_AMBULATORY_CARE_PROVIDER_SITE_OTHER): Payer: Medicare PPO | Admitting: Internal Medicine

## 2015-07-16 VITALS — BP 124/84 | HR 72 | Temp 97.9°F | Resp 16 | Ht 62.5 in | Wt 215.6 lb

## 2015-07-16 DIAGNOSIS — I10 Essential (primary) hypertension: Secondary | ICD-10-CM

## 2015-07-16 DIAGNOSIS — Z1212 Encounter for screening for malignant neoplasm of rectum: Secondary | ICD-10-CM

## 2015-07-16 DIAGNOSIS — E1121 Type 2 diabetes mellitus with diabetic nephropathy: Secondary | ICD-10-CM

## 2015-07-16 DIAGNOSIS — I5181 Takotsubo syndrome: Secondary | ICD-10-CM | POA: Diagnosis not present

## 2015-07-16 DIAGNOSIS — Z789 Other specified health status: Secondary | ICD-10-CM | POA: Diagnosis not present

## 2015-07-16 DIAGNOSIS — Z6838 Body mass index (BMI) 38.0-38.9, adult: Secondary | ICD-10-CM

## 2015-07-16 DIAGNOSIS — E782 Mixed hyperlipidemia: Secondary | ICD-10-CM

## 2015-07-16 DIAGNOSIS — Z1389 Encounter for screening for other disorder: Secondary | ICD-10-CM | POA: Diagnosis not present

## 2015-07-16 DIAGNOSIS — Z79899 Other long term (current) drug therapy: Secondary | ICD-10-CM

## 2015-07-16 DIAGNOSIS — Z9181 History of falling: Secondary | ICD-10-CM

## 2015-07-16 DIAGNOSIS — E559 Vitamin D deficiency, unspecified: Secondary | ICD-10-CM | POA: Diagnosis not present

## 2015-07-16 DIAGNOSIS — Z1211 Encounter for screening for malignant neoplasm of colon: Secondary | ICD-10-CM

## 2015-07-16 DIAGNOSIS — Z1331 Encounter for screening for depression: Secondary | ICD-10-CM

## 2015-07-16 LAB — CBC WITH DIFFERENTIAL/PLATELET
BASOS ABS: 0.1 10*3/uL (ref 0.0–0.1)
Basophils Relative: 1 % (ref 0–1)
EOS ABS: 0.2 10*3/uL (ref 0.0–0.7)
Eosinophils Relative: 3 % (ref 0–5)
HEMATOCRIT: 39.9 % (ref 36.0–46.0)
HEMOGLOBIN: 13.9 g/dL (ref 12.0–15.0)
LYMPHS ABS: 2.1 10*3/uL (ref 0.7–4.0)
LYMPHS PCT: 30 % (ref 12–46)
MCH: 29.8 pg (ref 26.0–34.0)
MCHC: 34.8 g/dL (ref 30.0–36.0)
MCV: 85.4 fL (ref 78.0–100.0)
MONOS PCT: 5 % (ref 3–12)
MPV: 10.8 fL (ref 8.6–12.4)
Monocytes Absolute: 0.4 10*3/uL (ref 0.1–1.0)
NEUTROS ABS: 4.3 10*3/uL (ref 1.7–7.7)
NEUTROS PCT: 61 % (ref 43–77)
PLATELETS: 243 10*3/uL (ref 150–400)
RBC: 4.67 MIL/uL (ref 3.87–5.11)
RDW: 14.5 % (ref 11.5–15.5)
WBC: 7.1 10*3/uL (ref 4.0–10.5)

## 2015-07-16 NOTE — Progress Notes (Signed)
Patient ID: [name], female   DOB: Jul 16, 1951, 64 y.o.   MRN: [id number]  Comprehensive Evaluation &  Examination  This very nice 64 y.o.female presents for presents for a comprehensive evaluation and management of multiple medical co-morbidities.  Patient has been followed for HTN, T2_NIDDM, Hyperlipidemia and Vitamin D Deficiency.    HTN predates since 5. Patient's BP has been controlled at home and patient denies any cardiac symptoms as chest pain, palpitations, shortness of breath, dizziness or ankle swelling. Today's BP: 124/84 mmHg    Patient's hyperlipidemia is controlled with diet and medications. Patient denies myalgias or other medication SE's. Last lipids were at goal with Cholesterol 174; HDL 51; LDL 83; Triglycerides 199 on 04/30/2015.   Patient has Morbid Obesity (BMI 38+) and consequent T2_NIDDM predating since 2007 and she has borderline CKD2 with GFR 88 ml/min  and patient denies reactive hypoglycemic symptoms, visual blurring, diabetic polys, or paresthesias. Last A1c was 5.6% on 04/30/2015.   Finally, patient has history of Vitamin D Deficiency and last Vitamin D was 69 on 04/30/2015.  Medication Sig  . aspirin EC 81 MG tablet Take 81 mg by mouth daily.  Marland Kitchen atenolol (TENORMIN) 100 MG tablet TAKE 1 TABLET BY MOUTH EVERY DAY  . Cholecalciferol (VITAMIN D-3) 5000 UNITS TABS Take 2 tablets by mouth every other day.   . enalapril (VASOTEC) 20 MG tablet TAKE 1 TABLET BY MOUTH EVERY DAY  . INVOKANA 300 MG TABS tablet TAKE 1 TABLET BY MOUTH EVERY DAY  . metFORMIN (GLUCOPHAGE) 500 MG tablet Take by mouth 2 (two) times daily with a meal.  . pantoprazole (PROTONIX) 40 MG tablet Take 1 tablet (40 mg total) by mouth daily.  . pravastatin (PRAVACHOL) 40 MG tablet Take 1 tablet (40 mg total) by mouth every evening.   No Known Allergies   Past Medical History  Diagnosis Date  . Hemorrhoids   . Arthritis   . HTN (hypertension)   . CAD (coronary artery disease)   . Coronary  disease     nonobstructive, history of  . Hypercholesterolemia   . Gastric reflux     history of  . Diabetes mellitus without complication ([place])   . Myocardial infarction ([place]) 2004    slight mi x 1   Health Maintenance  Topic Date Due  . Hepatitis C Screening  1951-08-10  . HIV Screening  03/09/1965  . OPHTHALMOLOGY EXAM  02/20/2015  . DEXA SCAN  03/10/2015  . FOOT EXAM  06/03/2015  . URINE MICROALBUMIN  06/03/2015  . HEMOGLOBIN A1C  10/28/2015  . INFLUENZA VACCINE  03/15/2016  . PNA vac Low Risk Adult (2 of 2 - PCV13) 04/29/2016  . PAP SMEAR  05/29/2016  . MAMMOGRAM  07/01/2016  . TETANUS/TDAP  03/11/2024  . COLONOSCOPY  01/22/2025  . ZOSTAVAX  Completed   Immunization History  Administered Date(s) Administered  . Influenza, High Dose Seasonal PF 04/30/2015  . Influenza-Unspecified 04/15/2013, 05/06/2014  . Pneumococcal Polysaccharide-23 04/30/2015  . Tdap 03/11/2014  . Zoster 05/23/2012   Past Surgical History  Procedure Laterality Date  . Cardiac catheterization  November 25 2004    left heart, minor coronary atherosclerosis  . Angioplasty    . Cesarean section      x 3  . Cholecystectomy  36 yrs ago  . Eye surgery Bilateral dec 2010     lens replacements  . Knee arthroscopy with medial menisectomy Left 02/25/2014    Procedure: LEFT KNEE ARTHROSCOPY WITH MEDIAL AND LATERAL MENISECTOMY,  MICROFRACTURE, SYNOVECTOMY SUPRA PATELLA;  Surgeon: Tobi Bastos, MD;  Location: [facility];  Service: Orthopedics;  Laterality: Left;   Family History  Problem Relation Age of Onset  . Hypertension Mother   . COPD Mother   . Thyroid disease Mother   . Heart attack Father   . Heart disease Father   . Arthritis Sister     Rheumatoid  . Hypertension Sister   . Graves' disease Sister   . Thyroid disease Sister   . Colon cancer Maternal Grandfather    Social History  Substance Use Topics  . Smoking status: Never Smoker   . Smokeless tobacco: Never Used  . Alcohol Use: 0.0  oz/week    0 Standard drinks or equivalent per week     Comment: rarel; twice yearly    ROS Constitutional: Denies fever, chills, weight loss/gain, headaches, insomnia,  night sweats, and change in appetite. Does c/o fatigue. Eyes: Denies redness, blurred vision, diplopia, discharge, itchy, watery eyes.  ENT: Denies discharge, congestion, post nasal drip, epistaxis, sore throat, earache, hearing loss, dental pain, Tinnitus, Vertigo, Sinus pain, snoring.  Cardio: Denies chest pain, palpitations, irregular heartbeat, syncope, dyspnea, diaphoresis, orthopnea, PND, claudication, edema Respiratory: denies cough, dyspnea, DOE, pleurisy, hoarseness, laryngitis, wheezing.  Gastrointestinal: Denies dysphagia, heartburn, reflux, water brash, pain, cramps, nausea, vomiting, bloating, diarrhea, constipation, hematemesis, melena, hematochezia, jaundice, hemorrhoids Genitourinary: Denies dysuria, frequency, urgency, nocturia, hesitancy, discharge, hematuria, flank pain Breast: Breast lumps, nipple discharge, bleeding.  Musculoskeletal: Denies arthralgia, myalgia, stiffness, Jt. Swelling, pain, limp, and strain/sprain. Denies falls. Skin: Denies puritis, rash, hives, warts, acne, eczema, changing in skin lesion Neuro: No weakness, tremor, incoordination, spasms, paresthesia, pain Psychiatric: Denies confusion, memory loss, sensory loss. Denies Depression. Endocrine: Denies change in weight, skin, hair change, nocturia, and paresthesia, diabetic polys, visual blurring, hyper / hypo glycemic episodes.  Heme/Lymph: No excessive bleeding, bruising, enlarged lymph nodes.  Physical Exam  BP 124/84 mmHg  Pulse 72  Temp(Src) 97.9 F (36.6 C)  Resp 16  Ht 5' 2.5" (1.588 m)  Wt 215 lb 9.6 oz (97.796 kg)  BMI 38.78 kg/m2  General Appearance: Well nourished and in no apparent distress. Eyes: PERRLA, EOMs, conjunctiva no swelling or erythema, normal fundi and vessels. Sinuses: No frontal/maxillary  tenderness ENT/Mouth: EACs patent / TMs  nl. Nares clear without erythema, swelling, mucoid exudates. Oral hygiene is good. No erythema, swelling, or exudate. Tongue normal, non-obstructing. Tonsils not swollen or erythematous. Hearing normal.  Neck: Supple, thyroid normal. No bruits, nodes or JVD. Respiratory: Respiratory effort normal.  BS equal and clear bilateral without rales, rhonci, wheezing or stridor. Cardio: Heart sounds are normal with regular rate and rhythm and no murmurs, rubs or gallops. Peripheral pulses are normal and equal bilaterally without edema. No aortic or femoral bruits. Chest: symmetric with normal excursions and percussion. Breasts: Symmetric, without lumps, nipple discharge, retractions, or fibrocystic changes.  Abdomen: Flat, soft, with bowl sounds. Nontender, no guarding, rebound, hernias, masses, or organomegaly.  Lymphatics: Non tender without lymphadenopathy.  Genitourinary:  Musculoskeletal: Full ROM all peripheral extremities, joint stability, 5/5 strength, and normal gait. Skin: Warm and dry without rashes, lesions, cyanosis, clubbing or  ecchymosis.  Neuro: Cranial nerves intact, reflexes equal bilaterally. Normal muscle tone, no cerebellar symptoms. Sensation intact by touch, vibratory and monofilament testing to the toes bilaterally .  Pysch: Awake and oriented X 3, normal affect, Insight and Judgment appropriate.   Assessment and Plan  1. Essential hypertension  - EKG 12-Lead - [place], RETROPERITNL ABD,  LTD - TSH  2. Hyperlipidemia  - Lipid panel - TSH  3. Type 2 diabetes mellitus with diabetic nephropathy, without long-term current use of insulin (HCC)  - Microalbumin / creatinine urine ratio - Hemoglobin A1c - Insulin, random - HM DIABETES FOOT EXAM - LOW EXTREMITY NEUR EXAM DOCUM  4. Vitamin D deficiency  - VITAMIN D 25 Hydroxy   5. Takotsubo syndrome   6. Screening for rectal cancer  - POC Hemoccult Bld/Stl   7. Depression  screen   8. At low risk for fall   9. Medication management  - Urinalysis, Routine w reflex microscopic  - CBC with Differential/Platelet - BASIC METABOLIC PANEL WITH GFR - Hepatic function panel - Magnesium  10. BMI 38.0-38.9,adult   11. Special screening for malignant neoplasms, colon   Continue prudent diet as discussed, weight control, BP monitoring, regular exercise, and medications. Discussed med's effects and SE's. Screening labs and tests as requested with regular follow-up as recommended.Over 40 minutes of exam, counseling, chart review and high complex critical decision making was performed

## 2015-07-16 NOTE — Patient Instructions (Signed)

## 2015-07-17 LAB — LIPID PANEL
Cholesterol: 161 mg/dL (ref 125–200)
HDL: 48 mg/dL (ref >=46)
LDL CALC: 62 mg/dL (ref <130)
Total CHOL/HDL Ratio: 3.4 Ratio (ref <=5.0)
Triglycerides: 253 mg/dL — ABNORMAL HIGH (ref <150)
VLDL: 51 mg/dL — ABNORMAL HIGH (ref <30)

## 2015-07-17 LAB — URINALYSIS, ROUTINE W REFLEX MICROSCOPIC
Bilirubin Urine: NEGATIVE
HGB URINE DIPSTICK: NEGATIVE
Ketones, ur: NEGATIVE
LEUKOCYTES UA: NEGATIVE
NITRITE: NEGATIVE
PH: 5.5 (ref 5.0–8.0)
Protein, ur: NEGATIVE
SPECIFIC GRAVITY, URINE: 1.03 (ref 1.001–1.035)

## 2015-07-17 LAB — MICROALBUMIN / CREATININE URINE RATIO
Creatinine, Urine: 77 mg/dL (ref 20–320)
MICROALB UR: 0.3 mg/dL
Microalb Creat Ratio: 4 mcg/mg creat (ref <30)

## 2015-07-17 LAB — HEMOGLOBIN A1C
Hgb A1c MFr Bld: 5.7 % — ABNORMAL HIGH (ref <5.7)
Mean Plasma Glucose: 117 mg/dL — ABNORMAL HIGH (ref <117)

## 2015-07-17 LAB — BASIC METABOLIC PANEL WITH GFR
BUN: 17 mg/dL (ref 7–25)
CHLORIDE: 105 mmol/L (ref 98–110)
CO2: 24 mmol/L (ref 20–31)
CREATININE: 0.72 mg/dL (ref 0.50–0.99)
Calcium: 9.1 mg/dL (ref 8.6–10.4)
GFR, Est African American: >89 mL/min (ref >=60)
GFR, Est Non African American: 88 mL/min (ref >=60)
Glucose, Bld: 109 mg/dL — ABNORMAL HIGH (ref 65–99)
POTASSIUM: 3.9 mmol/L (ref 3.5–5.3)
Sodium: 140 mmol/L (ref 135–146)

## 2015-07-17 LAB — HEPATIC FUNCTION PANEL
ALBUMIN: 3.9 g/dL (ref 3.6–5.1)
ALK PHOS: 62 U/L (ref 33–130)
ALT: 24 U/L (ref 6–29)
AST: 18 U/L (ref 10–35)
BILIRUBIN TOTAL: 0.6 mg/dL (ref 0.2–1.2)
Bilirubin, Direct: 0.1 mg/dL (ref <=0.2)
Indirect Bilirubin: 0.5 mg/dL (ref 0.2–1.2)
Total Protein: 6.5 g/dL (ref 6.1–8.1)

## 2015-07-17 LAB — MAGNESIUM: Magnesium: 1.9 mg/dL (ref 1.5–2.5)

## 2015-07-17 LAB — URINALYSIS, MICROSCOPIC ONLY
Bacteria, UA: NONE SEEN [HPF]
Casts: NONE SEEN [LPF]
Crystals: NONE SEEN [HPF]
RBC / HPF: NONE SEEN RBC/HPF (ref <=2)
Squamous Epithelial / LPF: NONE SEEN [HPF] (ref <=5)
YEAST: NONE SEEN [HPF]

## 2015-07-17 LAB — TSH: TSH: 3.077 u[IU]/mL (ref 0.350–4.500)

## 2015-07-17 LAB — VITAMIN D 25 HYDROXY (VIT D DEFICIENCY, FRACTURES): VIT D 25 HYDROXY: 68 ng/mL (ref 30–100)

## 2015-07-17 LAB — INSULIN, RANDOM: INSULIN: 34.1 u[IU]/mL — AB (ref 2.0–19.6)

## 2015-07-18 ENCOUNTER — Encounter: Payer: Self-pay | Admitting: Internal Medicine

## 2015-07-21 ENCOUNTER — Other Ambulatory Visit: Payer: Self-pay | Admitting: Internal Medicine

## 2015-07-22 ENCOUNTER — Other Ambulatory Visit: Payer: Self-pay | Admitting: Internal Medicine

## 2015-08-10 ENCOUNTER — Encounter: Payer: Self-pay | Admitting: *Deleted

## 2015-08-11 ENCOUNTER — Other Ambulatory Visit: Payer: Self-pay | Admitting: *Deleted

## 2015-08-11 DIAGNOSIS — Z1212 Encounter for screening for malignant neoplasm of rectum: Secondary | ICD-10-CM

## 2015-08-11 LAB — POC HEMOCCULT BLD/STL (HOME/3-CARD/SCREEN)
FECAL OCCULT BLD: NEGATIVE
FECAL OCCULT BLD: NEGATIVE
Fecal Occult Blood, POC: NEGATIVE

## 2015-10-02 ENCOUNTER — Encounter: Payer: Self-pay | Admitting: *Deleted

## 2015-10-03 ENCOUNTER — Encounter: Payer: Self-pay | Admitting: *Deleted

## 2015-10-07 ENCOUNTER — Other Ambulatory Visit: Payer: Self-pay | Admitting: Physician Assistant

## 2015-10-17 ENCOUNTER — Other Ambulatory Visit: Payer: Self-pay | Admitting: Internal Medicine

## 2015-10-20 ENCOUNTER — Other Ambulatory Visit: Payer: Self-pay | Admitting: Physician Assistant

## 2015-10-27 ENCOUNTER — Encounter: Payer: Self-pay | Admitting: Internal Medicine

## 2015-10-27 ENCOUNTER — Ambulatory Visit (INDEPENDENT_AMBULATORY_CARE_PROVIDER_SITE_OTHER): Payer: Medicare Other | Admitting: Internal Medicine

## 2015-10-27 VITALS — BP 124/80 | HR 58 | Temp 98.0°F | Resp 18 | Ht 63.0 in | Wt 213.0 lb

## 2015-10-27 DIAGNOSIS — E559 Vitamin D deficiency, unspecified: Secondary | ICD-10-CM

## 2015-10-27 DIAGNOSIS — Z79899 Other long term (current) drug therapy: Secondary | ICD-10-CM

## 2015-10-27 DIAGNOSIS — I10 Essential (primary) hypertension: Secondary | ICD-10-CM

## 2015-10-27 DIAGNOSIS — E782 Mixed hyperlipidemia: Secondary | ICD-10-CM

## 2015-10-27 DIAGNOSIS — E119 Type 2 diabetes mellitus without complications: Secondary | ICD-10-CM | POA: Diagnosis not present

## 2015-10-27 LAB — CBC WITH DIFFERENTIAL/PLATELET
BASOS ABS: 0 10*3/uL (ref 0.0–0.1)
BASOS PCT: 0 % (ref 0–1)
EOS PCT: 4 % (ref 0–5)
Eosinophils Absolute: 0.3 10*3/uL (ref 0.0–0.7)
HEMATOCRIT: 45.7 % (ref 36.0–46.0)
Hemoglobin: 15.7 g/dL — ABNORMAL HIGH (ref 12.0–15.0)
LYMPHS PCT: 22 % (ref 12–46)
Lymphs Abs: 1.6 10*3/uL (ref 0.7–4.0)
MCH: 29.6 pg (ref 26.0–34.0)
MCHC: 34.4 g/dL (ref 30.0–36.0)
MCV: 86.2 fL (ref 78.0–100.0)
MONO ABS: 0.4 10*3/uL (ref 0.1–1.0)
MPV: 11 fL (ref 8.6–12.4)
Monocytes Relative: 6 % (ref 3–12)
Neutro Abs: 5 10*3/uL (ref 1.7–7.7)
Neutrophils Relative %: 68 % (ref 43–77)
PLATELETS: 272 10*3/uL (ref 150–400)
RBC: 5.3 MIL/uL — AB (ref 3.87–5.11)
RDW: 13.9 % (ref 11.5–15.5)
WBC: 7.4 10*3/uL (ref 4.0–10.5)

## 2015-10-27 LAB — LIPID PANEL
CHOL/HDL RATIO: 3.8 ratio (ref <=5.0)
Cholesterol: 199 mg/dL (ref 125–200)
HDL: 53 mg/dL (ref >=46)
LDL CALC: 112 mg/dL (ref <130)
TRIGLYCERIDES: 172 mg/dL — AB (ref <150)
VLDL: 34 mg/dL — AB (ref <30)

## 2015-10-27 LAB — HEPATIC FUNCTION PANEL
ALK PHOS: 71 U/L (ref 33–130)
ALT: 28 U/L (ref 6–29)
AST: 18 U/L (ref 10–35)
Albumin: 4.4 g/dL (ref 3.6–5.1)
BILIRUBIN DIRECT: 0.1 mg/dL (ref <=0.2)
BILIRUBIN INDIRECT: 0.7 mg/dL (ref 0.2–1.2)
BILIRUBIN TOTAL: 0.8 mg/dL (ref 0.2–1.2)
Total Protein: 7 g/dL (ref 6.1–8.1)

## 2015-10-27 LAB — BASIC METABOLIC PANEL WITH GFR
BUN: 17 mg/dL (ref 7–25)
CALCIUM: 9.7 mg/dL (ref 8.6–10.4)
CO2: 24 mmol/L (ref 20–31)
Chloride: 104 mmol/L (ref 98–110)
Creat: 0.63 mg/dL (ref 0.50–0.99)
GLUCOSE: 128 mg/dL — AB (ref 65–99)
POTASSIUM: 4.4 mmol/L (ref 3.5–5.3)
SODIUM: 139 mmol/L (ref 135–146)

## 2015-10-27 LAB — TSH: TSH: 3.31 mIU/L

## 2015-10-27 MED ORDER — LOSARTAN POTASSIUM 50 MG PO TABS: 50.0000 mg | ORAL_TABLET | Freq: Every day | ORAL | Status: DC

## 2015-10-27 NOTE — Progress Notes (Signed)
Assessment and Plan:  Hypertension: -d/c enalapril -start losartan secondary to possible dry cough  -Continue medication -monitor blood pressure at home. -Continue DASH diet -Reminder to go to the ER if any CP, SOB, nausea, dizziness, severe HA, changes vision/speech, left arm numbness and tingling and jaw pain.  Cholesterol - Continue diet and exercise -Check cholesterol.   Diabetes without complications -Continue diet and exercise.  -Check A1C  Vitamin D Def -check level -continue medications.   Continue diet and meds as discussed. Further disposition pending results of labs. Discussed med's effects and SE's.    HPI 65 y.o. female  presents for 3 month follow up with hypertension, hyperlipidemia, diabetes and vitamin D deficiency.   Her blood pressure has been controlled at home, today their BP is BP: 124/80 mmHg.She does not workout. She denies chest pain, shortness of breath, dizziness.   She is on cholesterol medication and denies myalgias. Her cholesterol is at goal. The cholesterol was:  07/16/2015: Cholesterol 161; HDL 48; LDL Cholesterol 62; Triglycerides 253*   She has been working on diet and exercise for prediabetes without complications, she is on bASA, she is on ACE/ARB, and denies  foot ulcerations, hyperglycemia, hypoglycemia , increased appetite, nausea, paresthesia of the feet, polydipsia, polyuria, visual disturbances, vomiting and weight loss. Last A1C was: 07/16/2015: Hgb A1c MFr Bld 5.7*   Patient is on Vitamin D supplement. 07/16/2015: Vit D, 25-Hydroxy 68    Current Medications:  Current Outpatient Prescriptions on File Prior to Visit  Medication Sig Dispense Refill  . aspirin EC 81 MG tablet Take 81 mg by mouth daily.    Marland Kitchen atenolol (TENORMIN) 100 MG tablet TAKE 1 TABLET BY MOUTH EVERY DAY 90 tablet 0  . Cholecalciferol (VITAMIN D-3) 5000 UNITS TABS Take 2 tablets by mouth every other day.     . enalapril (VASOTEC) 20 MG tablet TAKE 1 TABLET BY MOUTH  EVERY DAY 90 tablet 1  . INVOKANA 300 MG TABS tablet TAKE 1 TABLET BY MOUTH EVERY DAY 90 tablet 1  . metFORMIN (GLUCOPHAGE) 500 MG tablet Take by mouth 2 (two) times daily with a meal.    . pantoprazole (PROTONIX) 40 MG tablet TAKE 1 TABLET(40 MG) BY MOUTH DAILY 90 tablet 1  . pravastatin (PRAVACHOL) 40 MG tablet TAKE 1 TABLET BY MOUTH EVERY EVENING 90 tablet 0   No current facility-administered medications on file prior to visit.   Medical History:  Past Medical History  Diagnosis Date  . Hemorrhoids   . Arthritis   . HTN (hypertension)   . CAD (coronary artery disease)   . Coronary disease     nonobstructive, history of  . Hypercholesterolemia   . Gastric reflux     history of  . Diabetes mellitus without complication ([place])   . Myocardial infarction ([place]) 2004    slight mi x 1   Allergies: No Known Allergies   Review of Systems:  Review of Systems  Constitutional: Negative for fever, chills and malaise/fatigue.  HENT: Negative for congestion, ear pain and sore throat.   Respiratory: Negative for cough, shortness of breath and wheezing.   Cardiovascular: Negative for chest pain, palpitations and leg swelling.  Gastrointestinal: Negative for heartburn, abdominal pain, diarrhea, constipation, blood in stool and melena.  Genitourinary: Negative.   Musculoskeletal: Positive for joint pain.  Skin: Negative.   Neurological: Negative for dizziness, loss of consciousness and headaches.  Psychiatric/Behavioral: Negative for depression. The patient has insomnia. The patient is not nervous/anxious.  Family history- Review and unchanged  Social history- Review and unchanged  Physical Exam: BP 124/80 mmHg  Pulse 58  Temp(Src) 98 F (36.7 C) (Temporal)  Resp 18  Ht 5\' 3"  (1.6 m)  Wt 213 lb (96.616 kg)  BMI 37.74 kg/m2 Wt Readings from Last 3 Encounters:  10/27/15 213 lb (96.616 kg)  07/16/15 215 lb 9.6 oz (97.796 kg)  04/30/15 209 lb (94.802 kg)   General Appearance:  Well nourished well developed, non-toxic appearing, in no apparent distress. Eyes: PERRLA, EOMs, conjunctiva no swelling or erythema ENT/Mouth: Ear canals clear with no erythema, swelling, or discharge.  TMs normal bilaterally, oropharynx clear, moist, with no exudate.   Neck: Supple, thyroid normal, no JVD, no cervical adenopathy.  Respiratory: Respiratory effort normal, breath sounds clear A&P, no wheeze, rhonchi or rales noted.  No retractions, no accessory muscle usage Cardio: RRR with no MRGs. No noted edema.  Abdomen: Soft, + BS.  Non tender, no guarding, rebound, hernias, masses. Musculoskeletal: Full ROM, 5/5 strength, Normal gait Skin: Warm, dry without rashes, lesions, ecchymosis.  Neuro: Awake and oriented X 3, Cranial nerves intact. No cerebellar symptoms.  Psych: normal affect, Insight and Judgment appropriate.    Starlyn Skeans, PA-C 9:07 AM [facility]

## 2015-10-28 LAB — HEMOGLOBIN A1C
HEMOGLOBIN A1C: 5.6 % (ref <5.7)
Mean Plasma Glucose: 114 mg/dL (ref <117)

## 2016-01-05 ENCOUNTER — Other Ambulatory Visit: Payer: Self-pay | Admitting: Internal Medicine

## 2016-01-15 ENCOUNTER — Other Ambulatory Visit: Payer: Self-pay | Admitting: Internal Medicine

## 2016-01-29 ENCOUNTER — Ambulatory Visit: Payer: Self-pay | Admitting: Internal Medicine

## 2016-02-16 NOTE — Patient Instructions (Signed)

## 2016-02-16 NOTE — Progress Notes (Signed)
Patient ID: [name], female   DOB: [birth year], 65 y.o.   MRN: [id number]  [facility] AD[facility] & ADOL[facility] MEDIC[facility]                       [name], M.D.        Uvaldo Bristle. Silverio Lay, P.A.-C       Starlyn Skeans, P.A.-C   [facility]                [address] [place], [place] [id number] Telephone [phone] Telefax [phone] ______________________________________________________________________     This very nice 65 y.o. DWF presents for 6 month follow up with Hypertension, Hyperlipidemia, Pre-Diabetes and Vitamin D Deficiency.      Patient is treated for HTN circa 1994 & BP has been controlled at home. Today's BP: 136/78 mmHg. Patient has had no complaints of any cardiac type chest pain, palpitations, dyspnea/orthopnea/PND, dizziness, claudication, or dependent edema. Of interest in 2004, patient was hospitalizased with a viral myocarditis and in 2006 had a negative & normal heart cath and lastly in 2009 had a negative Myoview.      Hyperlipidemia is not fully controlled with diet & meds. Patient denies myalgias or other med SE's. Last Lipids were  Cholesterol 199; HDL 53;  Elevated LDL 112; Triglycerides 172 on 10/27/2015.     Also, the patient has history of  Morbid Obesity (BMI 42+) and consequent T2_NIDDM since 2007 nd has had no symptoms of reactive hypoglycemia, diabetic polys, paresthesias or visual blurring.  Last A1c was 5.6% on 10/27/2015.     Further, the patient also has history of Vitamin D Deficiency of "20" in 2008 and supplements vitamin D without any suspected side-effects. Last vitamin D was  68 on 07/16/2015.    Medication Sig  . aspirin EC 81 MG  Take 81 mg by mouth daily.  Marland Kitchen atenolol  100 MG  TAKE 1 TABLET BY MOUTH EVERY DAY  . VITAMIN D 5000 u Take 2 tablets by mouth every other day.   . INVOKANA 300 MG  TAKE 1 TABLET BY MOUTH EVERY DAY  . losartan 50 MG Take 1 tablet (50 mg total) by mouth daily.   . metFORMIN 500 MG  Take by mouth 2 (two) times daily with a meal.  . pantoprazole  40 MG  TAKE 1 TABLET(40 MG) BY MOUTH DAILY  . pravastatin  40 MG  TAKE 1 TABLET BY MOUTH EVERY EVENING   No Known Allergies  PMHx:   Past Medical History  Diagnosis Date  . Hemorrhoids   . Arthritis   . HTN (hypertension)   . CAD (coronary artery disease)   . Coronary disease     nonobstructive, history of  . Hypercholesterolemia   . Gastric reflux     history of  . Diabetes mellitus without complication ([place])   . Myocardial infarction ([place]) 2004    slight mi x 1   Immunization History  Administered Date(s) Administered  . Influenza, High Dose Seasonal PF 04/30/2015  . Influenza-Unspecified 04/15/2013, 05/06/2014  . Pneumococcal Polysaccharide-23 04/30/2015  . Tdap 03/11/2014  . Zoster 05/23/2012   Past Surgical History  Procedure Laterality Date  . Cardiac catheterization  November 25 2004    left heart, minor coronary atherosclerosis  . Angioplasty    . Cesarean section      x 3  .  Cholecystectomy  36 yrs ago  . Eye surgery Bilateral dec 2010     lens replacements  . Knee arthroscopy with medial menisectomy Left 02/25/2014    Procedure: Lt.  KNEE ARTHROSCOPY  - Tobi Bastos, MD    Colonoscopy  (2016 ) negative recommended 10 yr f/u in 2026 - Dr Deatra Ina   FHx:    Reviewed / unchanged  SHx:    Reviewed / unchanged  Systems Review:  Constitutional: Denies fever, chills, wt changes, headaches, insomnia, fatigue, night sweats, change in appetite. Eyes: Denies redness, blurred vision, diplopia, discharge, itchy, watery eyes.  ENT: Denies discharge, congestion, post nasal drip, epistaxis, sore throat, earache, hearing loss, dental pain, tinnitus, vertigo, sinus pain, snoring.  CV: Denies chest pain, palpitations, irregular heartbeat, syncope, dyspnea, diaphoresis, orthopnea, PND, claudication or edema. Respiratory: denies cough, dyspnea, DOE, pleurisy, hoarseness, laryngitis, wheezing.   Gastrointestinal: Denies dysphagia, odynophagia, heartburn, reflux, water brash, abdominal pain or cramps, nausea, vomiting, bloating, diarrhea, constipation, hematemesis, melena, hematochezia  or hemorrhoids. Genitourinary: Denies dysuria, frequency, urgency, nocturia, hesitancy, discharge, hematuria or flank pain. Musculoskeletal: Denies arthralgias, myalgias, stiffness, jt. swelling, pain, limping or strain/sprain.  Skin: Denies pruritus, rash, hives, warts, acne, eczema or change in skin lesion(s). Neuro: No weakness, tremor, incoordination, spasms, paresthesia or pain. Psychiatric: Denies confusion, memory loss or sensory loss. Endo: Denies change in weight, skin or hair change.  Heme/Lymph: No excessive bleeding, bruising or enlarged lymph nodes.  Physical Exam  BP 136/78 mmHg  Pulse 64  Temp(Src) 97.4 F (36.3 C)  Resp 16  Ht 5' 2.5" (1.588 m)  Wt 219 lb (99.338 kg)  BMI 39.39 kg/m2  Appears well nourished and in no distress. Eyes: PERRLA, EOMs, conjunctiva no swelling or erythema. Sinuses: No frontal/maxillary tenderness ENT/Mouth: EAC's clear, TM's nl w/o erythema, bulging. Nares clear w/o erythema, swelling, exudates. Oropharynx clear without erythema or exudates. Oral hygiene is good. Tongue normal, non obstructing. Hearing intact.  Neck: Supple. Thyroid nl. Car 2+/2+ without bruits, nodes or JVD. Chest: Respirations nl with BS clear & equal w/o rales, rhonchi, wheezing or stridor.  Cor: Heart sounds normal w/ regular rate and rhythm without sig. murmurs, gallops, clicks, or rubs. Peripheral pulses normal and equal  without edema.  Abdomen: Soft & bowel sounds normal. Non-tender w/o guarding, rebound, hernias, masses, or organomegaly.  Lymphatics: Unremarkable.  Musculoskeletal: Full ROM all peripheral extremities, joint stability, 5/5 strength, and normal gait.  Skin: Warm, dry without exposed rashes, lesions or ecchymosis apparent.  Neuro: Cranial nerves intact, reflexes  equal bilaterally. Sensory-motor testing grossly intact. Tendon reflexes grossly intact.  Pysch: Alert & oriented x 3.  Insight and judgement nl & appropriate. No ideations.  Assessment and Plan:  - Continue monitor blood pressure at home. Continue diet/meds same. - Continue diet/meds, exercise,& lifestyle modifications. Continue monitor periodic cholesterol/liver & renal functions  - Continue diet, exercise, lifestyle modifications. Monitor appropriate labs. - Continue supplementation.  Recommended regular exercise, BP monitoring, weight control, and discussed med and SE's. Recommended labs to assess and monitor clinical status. Further disposition pending results of labs. Over 30 minutes of exam, counseling, chart review was performed

## 2016-02-17 ENCOUNTER — Ambulatory Visit (INDEPENDENT_AMBULATORY_CARE_PROVIDER_SITE_OTHER): Payer: Medicare Other | Admitting: Internal Medicine

## 2016-02-17 ENCOUNTER — Encounter: Payer: Self-pay | Admitting: Internal Medicine

## 2016-02-17 VITALS — BP 136/78 | HR 64 | Temp 97.4°F | Resp 16 | Ht 62.5 in | Wt 219.0 lb

## 2016-02-17 DIAGNOSIS — E782 Mixed hyperlipidemia: Secondary | ICD-10-CM | POA: Diagnosis not present

## 2016-02-17 DIAGNOSIS — I10 Essential (primary) hypertension: Secondary | ICD-10-CM | POA: Diagnosis not present

## 2016-02-17 DIAGNOSIS — E1122 Type 2 diabetes mellitus with diabetic chronic kidney disease: Secondary | ICD-10-CM | POA: Diagnosis not present

## 2016-02-17 DIAGNOSIS — N182 Chronic kidney disease, stage 2 (mild): Secondary | ICD-10-CM | POA: Diagnosis not present

## 2016-02-17 DIAGNOSIS — E559 Vitamin D deficiency, unspecified: Secondary | ICD-10-CM

## 2016-02-17 DIAGNOSIS — Z79899 Other long term (current) drug therapy: Secondary | ICD-10-CM | POA: Diagnosis not present

## 2016-02-17 LAB — HEMOGLOBIN A1C
HEMOGLOBIN A1C: 5.6 % (ref <5.7)
MEAN PLASMA GLUCOSE: 114 mg/dL

## 2016-02-17 LAB — CBC WITH DIFFERENTIAL/PLATELET
BASOS PCT: 0 %
Basophils Absolute: 0 cells/uL (ref 0–200)
EOS PCT: 3 %
Eosinophils Absolute: 210 cells/uL (ref 15–500)
HCT: 43.3 % (ref 35.0–45.0)
Hemoglobin: 14.3 g/dL (ref 11.7–15.5)
LYMPHS PCT: 24 %
Lymphs Abs: 1680 cells/uL (ref 850–3900)
MCH: 28.7 pg (ref 27.0–33.0)
MCHC: 33 g/dL (ref 32.0–36.0)
MCV: 86.8 fL (ref 80.0–100.0)
MONOS PCT: 5 %
MPV: 11.1 fL (ref 7.5–12.5)
Monocytes Absolute: 350 cells/uL (ref 200–950)
NEUTROS ABS: 4760 {cells}/uL (ref 1500–7800)
Neutrophils Relative %: 68 %
PLATELETS: 222 10*3/uL (ref 140–400)
RBC: 4.99 MIL/uL (ref 3.80–5.10)
RDW: 14.1 % (ref 11.0–15.0)
WBC: 7 10*3/uL (ref 3.8–10.8)

## 2016-02-17 LAB — HEPATIC FUNCTION PANEL
ALBUMIN: 4.3 g/dL (ref 3.6–5.1)
ALT: 28 U/L (ref 6–29)
AST: 19 U/L (ref 10–35)
Alkaline Phosphatase: 76 U/L (ref 33–130)
BILIRUBIN DIRECT: 0.1 mg/dL (ref <=0.2)
BILIRUBIN TOTAL: 0.7 mg/dL (ref 0.2–1.2)
Indirect Bilirubin: 0.6 mg/dL (ref 0.2–1.2)
Total Protein: 6.4 g/dL (ref 6.1–8.1)

## 2016-02-17 LAB — BASIC METABOLIC PANEL WITH GFR
BUN: 19 mg/dL (ref 7–25)
CALCIUM: 9.1 mg/dL (ref 8.6–10.4)
CHLORIDE: 108 mmol/L (ref 98–110)
CO2: 20 mmol/L (ref 20–31)
CREATININE: 0.81 mg/dL (ref 0.50–0.99)
GFR, EST AFRICAN AMERICAN: 88 mL/min (ref >=60)
GFR, Est Non African American: 76 mL/min (ref >=60)
Glucose, Bld: 135 mg/dL — ABNORMAL HIGH (ref 65–99)
Potassium: 3.9 mmol/L (ref 3.5–5.3)
SODIUM: 141 mmol/L (ref 135–146)

## 2016-02-17 LAB — LIPID PANEL
CHOL/HDL RATIO: 3.2 ratio (ref <=5.0)
CHOLESTEROL: 161 mg/dL (ref 125–200)
HDL: 50 mg/dL (ref >=46)
LDL Cholesterol: 65 mg/dL (ref <130)
TRIGLYCERIDES: 232 mg/dL — AB (ref <150)
VLDL: 46 mg/dL — ABNORMAL HIGH (ref <30)

## 2016-02-17 LAB — MAGNESIUM: MAGNESIUM: 1.8 mg/dL (ref 1.5–2.5)

## 2016-02-17 LAB — TSH: TSH: 4 mIU/L

## 2016-02-18 LAB — INSULIN, RANDOM: INSULIN: 38 u[IU]/mL — AB (ref 2.0–19.6)

## 2016-02-18 LAB — VITAMIN D 25 HYDROXY (VIT D DEFICIENCY, FRACTURES): Vit D, 25-Hydroxy: 67 ng/mL (ref 30–100)

## 2016-04-13 ENCOUNTER — Other Ambulatory Visit: Payer: Self-pay | Admitting: Internal Medicine

## 2016-04-17 ENCOUNTER — Other Ambulatory Visit: Payer: Self-pay | Admitting: Internal Medicine

## 2016-04-22 ENCOUNTER — Other Ambulatory Visit: Payer: Self-pay | Admitting: *Deleted

## 2016-04-22 MED ORDER — BISOPROLOL-HYDROCHLOROTHIAZIDE 10-6.25 MG PO TABS: 1.0000 | ORAL_TABLET | Freq: Every day | ORAL | 1 refills | Status: DC

## 2016-05-07 ENCOUNTER — Other Ambulatory Visit: Payer: Self-pay | Admitting: Internal Medicine

## 2016-07-19 ENCOUNTER — Encounter: Payer: Self-pay | Admitting: Internal Medicine

## 2016-07-19 ENCOUNTER — Ambulatory Visit (INDEPENDENT_AMBULATORY_CARE_PROVIDER_SITE_OTHER): Payer: Medicare Other | Admitting: Internal Medicine

## 2016-07-19 VITALS — BP 126/78 | HR 68 | Temp 97.6°F | Resp 16 | Ht 63.0 in | Wt 221.6 lb

## 2016-07-19 DIAGNOSIS — N182 Chronic kidney disease, stage 2 (mild): Secondary | ICD-10-CM

## 2016-07-19 DIAGNOSIS — E782 Mixed hyperlipidemia: Secondary | ICD-10-CM

## 2016-07-19 DIAGNOSIS — Z136 Encounter for screening for cardiovascular disorders: Secondary | ICD-10-CM

## 2016-07-19 DIAGNOSIS — Z79899 Other long term (current) drug therapy: Secondary | ICD-10-CM

## 2016-07-19 DIAGNOSIS — E559 Vitamin D deficiency, unspecified: Secondary | ICD-10-CM

## 2016-07-19 DIAGNOSIS — I10 Essential (primary) hypertension: Secondary | ICD-10-CM | POA: Diagnosis not present

## 2016-07-19 DIAGNOSIS — Z0001 Encounter for general adult medical examination with abnormal findings: Secondary | ICD-10-CM

## 2016-07-19 DIAGNOSIS — Z0000 Encounter for general adult medical examination without abnormal findings: Secondary | ICD-10-CM | POA: Diagnosis not present

## 2016-07-19 DIAGNOSIS — Z1212 Encounter for screening for malignant neoplasm of rectum: Secondary | ICD-10-CM

## 2016-07-19 DIAGNOSIS — E1122 Type 2 diabetes mellitus with diabetic chronic kidney disease: Secondary | ICD-10-CM

## 2016-07-19 LAB — TSH: TSH: 3.76 m[IU]/L

## 2016-07-19 LAB — CBC WITH DIFFERENTIAL/PLATELET
BASOS ABS: 67 {cells}/uL (ref 0–200)
Basophils Relative: 1 %
EOS ABS: 201 {cells}/uL (ref 15–500)
Eosinophils Relative: 3 %
HEMATOCRIT: 43.9 % (ref 35.0–45.0)
Hemoglobin: 14.6 g/dL (ref 11.7–15.5)
LYMPHS PCT: 30 %
Lymphs Abs: 2010 cells/uL (ref 850–3900)
MCH: 28.9 pg (ref 27.0–33.0)
MCHC: 33.3 g/dL (ref 32.0–36.0)
MCV: 86.8 fL (ref 80.0–100.0)
MONOS PCT: 7 %
MPV: 10.9 fL (ref 7.5–12.5)
Monocytes Absolute: 469 cells/uL (ref 200–950)
NEUTROS PCT: 59 %
Neutro Abs: 3953 cells/uL (ref 1500–7800)
PLATELETS: 251 10*3/uL (ref 140–400)
RBC: 5.06 MIL/uL (ref 3.80–5.10)
RDW: 14.2 % (ref 11.0–15.0)
WBC: 6.7 10*3/uL (ref 3.8–10.8)

## 2016-07-19 NOTE — Progress Notes (Signed)
[name], M.D.    [name], P.A.-C      [name], P.A.-C  [facility]                [address] [place], [place] [id number] Telephone [phone] Telefax [phone]  Annual Screening/Preventative Visit & Comprehensive Evaluation &  Examination     This very nice 65 y.o. DWF presents for a Screening/Preventative Visit & comprehensive evaluation and management of multiple medical co-morbidities.  Patient has been followed for HTN, T2_NIDDM , Hyperlipidemia and Vitamin D Deficiency. Patient has GERD controlled with diet & meds. Patient had a Neg Colonoscopy in 2016 recommending 10 yr f/u.      HTN predates circa 1994. Patient's BP has been controlled at home and patient denies any cardiac symptoms as chest pain, palpitations, shortness of breath, dizziness or ankle swelling. In 2004, patient was hospitalized with a viral myocarditis and in 2006 had a negative & normal heart cath and had a negative Myoview in 2009 . Today's BP is at goal -  126/78.      Patient's hyperlipidemia is controlled with diet and medications. Patient denies myalgias or other medication SE's. 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 Last A1c was at goal: Lab Results  Component Value Date   HGBA1C 5.6 02/17/2016      Finally, patient has history of Vitamin D Deficiency in 2008 of "20" and last Vitamin D was at goal: Lab Results  Component Value Date   VD25OH 67 02/17/2016   Current Outpatient Prescriptions on File Prior to Visit  Medication Sig  . aspirin  EC 81 MG tablet Take 81 mg by mouth daily.  . bisoprolol-hydrochlorothiazide (ZIAC) 10-6.25 MG tablet Take 1 tablet by mouth daily.  . Cholecalciferol (VITAMIN D-3) 5000 UNITS TABS Take 2 tablets by mouth every other day.   . INVOKANA 300 MG TABS tablet TAKE 1 TABLET BY MOUTH EVERY DAY  . losartan (COZAAR) 50 MG tablet Take 1 tablet (50 mg total) by mouth daily.  . metFORMIN (GLUCOPHAGE-XR) 500 MG 24 hr tablet TAKE 1 TABLET BY MOUTH WITH BREAKFAST THEN TAKE 1 TABLET BY MOUTH WITH LUNCH AND TAKE 2 TABLETS BY MOUTH WITH DINNER  . pantoprazole (PROTONIX) 40 MG tablet TAKE 1 TABLET(40 MG) BY MOUTH DAILY  . pravastatin (PRAVACHOL) 40 MG tablet TAKE 1 TABLET BY MOUTH EVERY EVENING   No current facility-administered medications on file prior to visit.    No Known Allergies Past Medical History:  Diagnosis Date  . Arthritis   . CAD (coronary artery disease)   . Coronary disease    nonobstructive, history of  . Diabetes mellitus without complication ([place])   . Gastric reflux    history of  . Hemorrhoids   . HTN (hypertension)   . Hypercholesterolemia   . Myocardial infarction 2004   slight mi x 1   Health Maintenance  Topic Date Due  . Hepatitis C Screening  April 26, 1951  . INFLUENZA VACCINE  03/15/2016  .  PNA vac Low Risk Adult (2 of 2 - PCV13) 04/29/2016  . FOOT EXAM  07/15/2016  . OPHTHALMOLOGY EXAM  08/03/2016  . HEMOGLOBIN A1C  08/19/2016  . MAMMOGRAM  07/14/2017  . TETANUS/TDAP  03/11/2024  . COLONOSCOPY  01/22/2025  . DEXA SCAN  Completed  . ZOSTAVAX  Completed   Immunization History  Administered Date(s) Administered  . Influenza, High Dose Seasonal PF 04/30/2015  . Influenza-Unspecified 04/15/2013, 05/06/2014  . Pneumococcal Polysaccharide-23 04/30/2015  . Tdap 03/11/2014  . Zoster 05/23/2012   Past Surgical History:  Procedure Laterality Date  . ANGIOPLASTY    . CARDIAC CATHETERIZATION  November 25 2004   left heart, minor coronary atherosclerosis  . CESAREAN SECTION      x 3  . CHOLECYSTECTOMY  36 yrs ago  . EYE SURGERY Bilateral dec 2010    lens replacements  . KNEE ARTHROSCOPY WITH MEDIAL MENISECTOMY Left 02/25/2014   Procedure: LEFT KNEE ARTHROSCOPY WITH MEDIAL AND LATERAL MENISECTOMY, MICROFRACTURE, SYNOVECTOMY SUPRA PATELLA;  Surgeon: Tobi Bastos, MD;  Location: [facility];  Service: Orthopedics;  Laterality: Left;   Family History  Problem Relation Age of Onset  . Hypertension Mother   . COPD Mother   . Thyroid disease Mother   . Heart attack Father   . Heart disease Father   . Arthritis Sister     Rheumatoid  . Hypertension Sister   . Graves' disease Sister   . Thyroid disease Sister   . Colon cancer Maternal Grandfather    Social History  Substance Use Topics  . Smoking status: Never Smoker  . Smokeless tobacco: Never Used  . Alcohol use 0.0 oz/week     Comment: rarel; twice yearly    ROS Constitutional: Denies fever, chills, weight loss/gain, headaches, insomnia,  night sweats, and change in appetite. Does c/o fatigue. Eyes: Denies redness, blurred vision, diplopia, discharge, itchy, watery eyes.  ENT: Denies discharge, congestion, post nasal drip, epistaxis, sore throat, earache, hearing loss, dental pain, Tinnitus, Vertigo, Sinus pain, snoring.  Cardio: Denies chest pain, palpitations, irregular heartbeat, syncope, dyspnea, diaphoresis, orthopnea, PND, claudication, edema Respiratory: denies cough, dyspnea, DOE, pleurisy, hoarseness, laryngitis, wheezing.  Gastrointestinal: Denies dysphagia, heartburn, reflux, water brash, pain, cramps, nausea, vomiting, bloating, diarrhea, constipation, hematemesis, melena, hematochezia, jaundice, hemorrhoids Genitourinary: Denies dysuria, frequency, urgency, nocturia, hesitancy, discharge, hematuria, flank pain Breast: Breast lumps, nipple discharge, bleeding.  Musculoskeletal: Denies arthralgia, myalgia, stiffness, Jt. Swelling, pain, limp, and strain/sprain. Denies falls. Skin: Denies puritis,  rash, hives, warts, acne, eczema, changing in skin lesion Neuro: No weakness, tremor, incoordination, spasms, paresthesia, pain Psychiatric: Denies confusion, memory loss, sensory loss. Denies Depression. Endocrine: Denies change in weight, skin, hair change, nocturia, and paresthesia, diabetic polys, visual blurring, hyper / hypo glycemic episodes.  Heme/Lymph: No excessive bleeding, bruising, enlarged lymph nodes.  Physical Exam  BP 126/78   Pulse 68   Temp 97.6 F (36.4 C)   Resp 16   Ht 5\' 3"  (1.6 m)   Wt 221 lb 9.6 oz (100.5 kg)   BMI 39.25 kg/m   General Appearance: Over nourished and in no apparent distress.  Eyes: PERRLA, EOMs, conjunctiva no swelling or erythema, normal fundi and vessels. Sinuses: No frontal/maxillary tenderness ENT/Mouth: EACs patent / TMs  nl. Nares clear without erythema, swelling, mucoid exudates. Oral hygiene is good. No erythema, swelling, or exudate. Tongue normal, non-obstructing. Tonsils not swollen or erythematous. Hearing normal.  Neck: Supple, thyroid normal. No bruits, nodes or JVD. Respiratory: Respiratory effort normal.  BS equal  and clear bilateral without rales, rhonci, wheezing or stridor. Cardio: Heart sounds are normal with regular rate and rhythm and no murmurs, rubs or gallops. Peripheral pulses are normal and equal bilaterally without edema. No aortic or femoral bruits. Chest: symmetric with normal excursions and percussion. Breasts: Symmetric, without lumps, nipple discharge, retractions, or fibrocystic changes.  Abdomen: Flat, soft with bowel sounds active. Nontender, no guarding, rebound, hernias, masses, or organomegaly.  Lymphatics: Non tender without lymphadenopathy.  Genitourinary:  Musculoskeletal: Full ROM all peripheral extremities, joint stability, 5/5 strength, and normal gait. Skin: Warm and dry without rashes, lesions, cyanosis, clubbing or  ecchymosis.  Neuro: Cranial nerves intact, reflexes equal bilaterally. Normal  muscle tone, no cerebellar symptoms. Sensation intact to touch, Vibratory & Monofilament to the toes bilaterally.  Pysch: Alert and oriented X 3, normal affect, Insight and Judgment appropriate.   Assessment and Plan  1. Annual Preventative Screening Examination   2. Essential hypertension  - Microalbumin / creatinine urine ratio - EKG 12-Lead - Urinalysis, Routine w reflex microscopic - BASIC METABOLIC PANEL WITH GFR - TSH  3. Hyperlipidemia  - Hepatic function panel - Lipid panel - TSH  4. 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 Vitamin D deficiency  - VITAMIN D 25 Hydroxy   6. Screening for rectal cancer  - POC Hemoccult Bld/Stl  7. Screening for ischemic heart disease   8. Medication management  - Urinalysis, Routine w reflex microscopic - CBC with Differential/Platelet - BASIC METABOLIC PANEL WITH GFR - Magnesium       Continue prudent diet as discussed, weight control, BP monitoring, regular exercise, and medications. Discussed med's effects and SE's. Screening labs and tests as requested with regular follow-up as recommended. Over 40 minutes of exam, counseling, chart review and high complex critical decision making was performed.

## 2016-07-19 NOTE — Patient Instructions (Signed)

## 2016-07-20 ENCOUNTER — Other Ambulatory Visit: Payer: Self-pay | Admitting: Internal Medicine

## 2016-07-20 DIAGNOSIS — N3000 Acute cystitis without hematuria: Secondary | ICD-10-CM

## 2016-07-20 LAB — URINALYSIS, MICROSCOPIC ONLY
Bacteria, UA: NONE SEEN [HPF]
CASTS: NONE SEEN [LPF]
CRYSTALS: NONE SEEN [HPF]
RBC / HPF: NONE SEEN RBC/HPF (ref <=2)
Squamous Epithelial / LPF: NONE SEEN [HPF] (ref <=5)
YEAST: NONE SEEN [HPF]

## 2016-07-20 LAB — BASIC METABOLIC PANEL WITH GFR
BUN: 18 mg/dL (ref 7–25)
CO2: 22 mmol/L (ref 20–31)
CREATININE: 0.81 mg/dL (ref 0.50–0.99)
Calcium: 9.8 mg/dL (ref 8.6–10.4)
Chloride: 100 mmol/L (ref 98–110)
GFR, EST AFRICAN AMERICAN: 88 mL/min (ref >=60)
GFR, Est Non African American: 76 mL/min (ref >=60)
GLUCOSE: 123 mg/dL — AB (ref 65–99)
Potassium: 4.2 mmol/L (ref 3.5–5.3)
Sodium: 137 mmol/L (ref 135–146)

## 2016-07-20 LAB — HEPATIC FUNCTION PANEL
ALBUMIN: 4.4 g/dL (ref 3.6–5.1)
ALT: 30 U/L — ABNORMAL HIGH (ref 6–29)
AST: 19 U/L (ref 10–35)
Alkaline Phosphatase: 66 U/L (ref 33–130)
BILIRUBIN TOTAL: 0.7 mg/dL (ref 0.2–1.2)
Bilirubin, Direct: 0.1 mg/dL (ref <=0.2)
Indirect Bilirubin: 0.6 mg/dL (ref 0.2–1.2)
TOTAL PROTEIN: 6.8 g/dL (ref 6.1–8.1)

## 2016-07-20 LAB — URINALYSIS, ROUTINE W REFLEX MICROSCOPIC
BILIRUBIN URINE: NEGATIVE
KETONES UR: NEGATIVE
NITRITE: NEGATIVE
PH: 6 (ref 5.0–8.0)
Protein, ur: NEGATIVE
Specific Gravity, Urine: 1.018 (ref 1.001–1.035)

## 2016-07-20 LAB — LIPID PANEL
Cholesterol: 181 mg/dL (ref <200)
HDL: 46 mg/dL — AB (ref >50)
LDL Cholesterol: 73 mg/dL (ref <100)
TRIGLYCERIDES: 311 mg/dL — AB (ref <150)
Total CHOL/HDL Ratio: 3.9 Ratio (ref <5.0)
VLDL: 62 mg/dL — ABNORMAL HIGH (ref <30)

## 2016-07-20 LAB — MAGNESIUM: MAGNESIUM: 2 mg/dL (ref 1.5–2.5)

## 2016-07-20 LAB — MICROALBUMIN / CREATININE URINE RATIO
Creatinine, Urine: 38 mg/dL (ref 20–320)
MICROALB UR: 2.9 mg/dL
Microalb Creat Ratio: 76 mcg/mg creat — ABNORMAL HIGH (ref <30)

## 2016-07-20 LAB — VITAMIN D 25 HYDROXY (VIT D DEFICIENCY, FRACTURES): VIT D 25 HYDROXY: 71 ng/mL (ref 30–100)

## 2016-07-20 LAB — INSULIN, RANDOM: INSULIN: 40.2 u[IU]/mL — AB (ref 2.0–19.6)

## 2016-07-20 LAB — HEMOGLOBIN A1C
Hgb A1c MFr Bld: 5.6 % (ref <5.7)
MEAN PLASMA GLUCOSE: 114 mg/dL

## 2016-07-20 MED ORDER — CIPROFLOXACIN HCL 250 MG PO TABS: ORAL_TABLET | ORAL | 0 refills | Status: DC

## 2016-08-17 ENCOUNTER — Other Ambulatory Visit: Payer: Self-pay

## 2016-08-17 DIAGNOSIS — Z1212 Encounter for screening for malignant neoplasm of rectum: Secondary | ICD-10-CM

## 2016-08-17 LAB — POC HEMOCCULT BLD/STL (HOME/3-CARD/SCREEN)
Card #2 Fecal Occult Blod, POC: NEGATIVE
Card #3 Fecal Occult Blood, POC: NEGATIVE
Fecal Occult Blood, POC: NEGATIVE

## 2016-08-20 ENCOUNTER — Encounter: Payer: Self-pay | Admitting: *Deleted

## 2016-10-12 ENCOUNTER — Other Ambulatory Visit: Payer: Self-pay | Admitting: Internal Medicine

## 2016-10-16 ENCOUNTER — Other Ambulatory Visit: Payer: Self-pay | Admitting: Internal Medicine

## 2016-10-26 ENCOUNTER — Ambulatory Visit: Payer: Self-pay | Admitting: Internal Medicine

## 2016-11-01 ENCOUNTER — Ambulatory Visit (INDEPENDENT_AMBULATORY_CARE_PROVIDER_SITE_OTHER): Payer: Medicare Other | Admitting: Internal Medicine

## 2016-11-01 ENCOUNTER — Encounter: Payer: Self-pay | Admitting: Internal Medicine

## 2016-11-01 VITALS — BP 108/74 | HR 64 | Temp 97.8°F | Resp 18 | Ht 63.0 in | Wt 222.0 lb

## 2016-11-01 DIAGNOSIS — Z0000 Encounter for general adult medical examination without abnormal findings: Secondary | ICD-10-CM

## 2016-11-01 DIAGNOSIS — R6889 Other general symptoms and signs: Secondary | ICD-10-CM | POA: Diagnosis not present

## 2016-11-01 DIAGNOSIS — M67449 Ganglion, unspecified hand: Secondary | ICD-10-CM

## 2016-11-01 DIAGNOSIS — I10 Essential (primary) hypertension: Secondary | ICD-10-CM

## 2016-11-01 DIAGNOSIS — E559 Vitamin D deficiency, unspecified: Secondary | ICD-10-CM

## 2016-11-01 DIAGNOSIS — E782 Mixed hyperlipidemia: Secondary | ICD-10-CM

## 2016-11-01 DIAGNOSIS — Z23 Encounter for immunization: Secondary | ICD-10-CM

## 2016-11-01 DIAGNOSIS — Z0001 Encounter for general adult medical examination with abnormal findings: Secondary | ICD-10-CM | POA: Diagnosis not present

## 2016-11-01 DIAGNOSIS — I5181 Takotsubo syndrome: Secondary | ICD-10-CM

## 2016-11-01 DIAGNOSIS — Z79899 Other long term (current) drug therapy: Secondary | ICD-10-CM

## 2016-11-01 DIAGNOSIS — M159 Polyosteoarthritis, unspecified: Secondary | ICD-10-CM

## 2016-11-01 DIAGNOSIS — E1122 Type 2 diabetes mellitus with diabetic chronic kidney disease: Secondary | ICD-10-CM

## 2016-11-01 DIAGNOSIS — N182 Chronic kidney disease, stage 2 (mild): Secondary | ICD-10-CM

## 2016-11-01 DIAGNOSIS — K649 Unspecified hemorrhoids: Secondary | ICD-10-CM

## 2016-11-01 DIAGNOSIS — M1712 Unilateral primary osteoarthritis, left knee: Secondary | ICD-10-CM

## 2016-11-01 DIAGNOSIS — Z6838 Body mass index (BMI) 38.0-38.9, adult: Secondary | ICD-10-CM

## 2016-11-01 NOTE — Progress Notes (Signed)
MEDICARE ANNUAL WELLNESS VISIT AND FOLLOW UP Assessment:    1. Digital mucinous cyst of finger  - Ambulatory referral to Orthopedics  2. Essential hypertension -cont meds -well controlled -dash diet -exercise as tolerated - TSH  3. Type 2 diabetes mellitus with stage 2 chronic kidney disease, without long-term current use of insulin ([place]) -really well controlled -consider stopping invokana as A1c last visit was really low - Hemoglobin A1c  4. Hyperlipidemia -cont diet and exercise - Lipid panel  5. Medication management  - CBC with Differential/Platelet - BASIC METABOLIC PANEL WITH GFR - Hepatic function panel  6. Need for prophylactic vaccination against Streptococcus pneumoniae (pneumococcus)  - Pneumococcal conjugate vaccine 13-valent  7. Hemorrhoids, unspecified hemorrhoid type -asymptomatic -uses anusol as needed  8. Stress-induced cardiomyopathy -followed by cardiology  9. Takotsubo syndrome -followed by cardiology  10. DJD -takes tylenol or tramadol as needed for pain  11. Osteoarthritis of left knee, unspecified osteoarthritis type -takes tylenol or tramadol as needed  12. BMI 38.0-38.9,adult -continue diet and exercise -not good candidate for phentermine due to her cardiac history  13. Morbid obesity (BMI 32)  -cont diet and exercise  14. Vitamin D deficiency -cont vitamin D supplement    Over 30 minutes of exam, counseling, chart review, and critical decision making was performed  Future Appointments Date Time Provider [place]  02/01/2017 9:30 AM [name], MD [facility] None  08/29/2017 3:00 PM [name], MD [facility] None     Plan:   During the course of the visit the patient was educated and counseled about appropriate screening and preventive services including:    Pneumococcal vaccine   Influenza vaccine  Prevnar 13  Td vaccine  Screening electrocardiogram  Colorectal cancer screening  Diabetes  screening  Glaucoma screening  Nutrition counseling    Subjective:  [name] is a 66 y.o. female who presents for Medicare Annual Wellness Visit and 3 month follow up for HTN, hyperlipidemia, prediabetes, and vitamin D Def.   Her blood pressure has been controlled at home, today their BP is BP: 108/74 She does workout. She denies chest pain, shortness of breath, dizziness.    She is on cholesterol medication and denies myalgias. Her cholesterol is at goal. The cholesterol last visit was:   Lab Results  Component Value Date   CHOL 170 11/01/2016   HDL 45 (L) 11/01/2016   LDLCALC 66 11/01/2016   TRIG 297 (H) 11/01/2016   CHOLHDL 3.8 11/01/2016   She has a history of well controlled diabetes with oral medications.  Her last A1c level was.  5.5.  She is not having any diabetic neuropathy.  She is regularly getting her eyes checked.   Lab Results  Component Value Date   HGBA1C 5.5 11/01/2016    Last GFR Lab Results  Component Value Date   GFRNONAA 81 11/01/2016     Lab Results  Component Value Date   GFRAA >89 11/01/2016   Patient is on Vitamin D supplement.   Lab Results  Component Value Date   VD25OH 37 07/19/2016     Patient reports that she has a growing nodule on her finger.  It is non-tender and non-painful however it is getting bigger and if it does continue to grow it may interfere with her ability to hold onto a pen/pencil.    Medication Review: Current Outpatient Prescriptions on File Prior to Visit  Medication Sig Dispense Refill  . aspirin EC 81 MG tablet Take 81 mg by mouth  daily.    . bisoprolol-hydrochlorothiazide (ZIAC) 10-6.25 MG tablet TAKE 1 TABLET BY MOUTH DAILY 90 tablet 0  . Cholecalciferol (VITAMIN D-3) 5000 UNITS TABS Take 2 tablets by mouth daily.     . INVOKANA 300 MG TABS tablet TAKE 1 TABLET BY MOUTH EVERY DAY 90 tablet 1  . metFORMIN (GLUCOPHAGE-XR) 500 MG 24 hr tablet TAKE 1 TABLET BY MOUTH WITH BREAKFAST THEN TAKE 1 TABLET BY MOUTH  WITH LUNCH AND TAKE 2 TABLETS BY MOUTH WITH DINNER 360 tablet 1  . pantoprazole (PROTONIX) 40 MG tablet TAKE 1 TABLET(40 MG) BY MOUTH DAILY 90 tablet 0  . pravastatin (PRAVACHOL) 40 MG tablet TAKE 1 TABLET BY MOUTH EVERY EVENING 90 tablet 0  . losartan (COZAAR) 50 MG tablet Take 1 tablet (50 mg total) by mouth daily. 60 tablet 11   No current facility-administered medications on file prior to visit.     Allergies: No Known Allergies  Current Problems (verified) has Essential hypertension; Hemorrhoids; DJD; Stress-induced cardiomyopathy; T2_NIDDM w/Stage 2 CKD (GFR 76 ml/min); Hyperlipidemia; Vitamin D deficiency; Medication management; Osteoarthritis of left knee; Morbid obesity (BMI 32) ; Takotsubo syndrome; and BMI 38.0-38.9,adult on her problem list.  Screening Tests Immunization History  Administered Date(s) Administered  . Influenza, High Dose Seasonal PF 04/30/2015  . Influenza-Unspecified 04/15/2013, 05/06/2014  . Pneumococcal Conjugate-13 11/01/2016  . Pneumococcal Polysaccharide-23 04/30/2015  . Tdap 03/11/2014  . Zoster 05/23/2012    Preventative care: Last colonoscopy: 2016 Pap: Following with OBGYN DEXA: 2016 MM: 2017  Names of Other Physician/Practitioners you currently use: 1. [facility] here for primary care 2. Dr. Delman Cheadle, eye doctor, last visit 2017 3. Dr. Orvil Feil, dentist, last visit 2018 Patient Care Team: [name], MD as PCP - General (Internal Medicine) Melissa Noon, OD as Referring Physician (Optometry)  Surgical: She  has a past surgical history that includes Cardiac catheterization (November 25 2004); Angioplasty; Cesarean section; Cholecystectomy (36 yrs ago); Eye surgery (Bilateral, dec 2010); and Knee arthroscopy with medial menisectomy (Left, 02/25/2014). Family Her family history includes Arthritis in her sister; COPD in her mother; Colon cancer in her maternal grandfather; Berenice Primas' disease in her sister; Heart  attack in her father; Heart disease in her father; Hypertension in her mother and sister; Thyroid disease in her mother and sister. Social history  She reports that she has never smoked. She has never used smokeless tobacco. She reports that she drinks alcohol. She reports that she does not use drugs.  MEDICARE WELLNESS OBJECTIVES: Physical activity: Current Exercise Habits: The patient does not participate in regular exercise at present Cardiac risk factors: Cardiac Risk Factors include: advanced age (>64men, >16 women);diabetes mellitus;dyslipidemia;hypertension;obesity (BMI >30kg/m2);sedentary lifestyle Depression/mood screen:   Depression screen [facility] 2/9 11/01/2016  Decreased Interest 0  Down, Depressed, Hopeless 0  PHQ - 2 Score 0    ADLs:  In your present state of health, do you have any difficulty performing the following activities: 11/01/2016 07/19/2016  Hearing? N N  Vision? N N  Difficulty concentrating or making decisions? N N  Walking or climbing stairs? N N  Dressing or bathing? N N  Doing errands, shopping? N N  Preparing Food and eating ? N -  Using the Toilet? N -  In the past six months, have you accidently leaked urine? N -  Do you have problems with loss of bowel control? N -  Managing your Medications? N -  Managing your Finances? N -  Housekeeping or managing your Housekeeping? N -  Some recent data might be hidden     Cognitive Testing  Alert? Yes  Normal Appearance?Yes  Oriented to person? Yes  Place? Yes   Time? Yes  Recall of three objects?  Yes  Can perform simple calculations? Yes  Displays appropriate judgment?Yes  Can read the correct time from a watch face?Yes  EOL planning: Does Patient Have a Medical Advance Directive?: No Would patient like information on creating a medical advance directive?: Yes (MAU/Ambulatory/Procedural Areas - Information given)   Objective:   Today's Vitals   11/01/16 1105  BP: 108/74  Pulse: 64  Resp: 18  Temp:  97.8 F (36.6 C)  TempSrc: Temporal  Weight: 222 lb (100.7 kg)  Height: 5\' 3"  (1.6 m)   Body mass index is 39.33 kg/m.  General appearance: alert, no distress, WD/WN, female HEENT: normocephalic, sclerae anicteric, TMs pearly, nares patent, no discharge or erythema, pharynx normal Oral cavity: MMM, no lesions Neck: supple, no lymphadenopathy, no thyromegaly, no masses Heart: RRR, normal S1, S2, no murmurs Lungs: CTA bilaterally, no wheezes, rhonchi, or rales Abdomen: +bs, soft, non tender, non distended, no masses, no hepatomegaly, no splenomegaly Musculoskeletal: 1 cm x 0.5 cm nodule to the dorsal surface of the left middle finger.  Non-tender to palpation. Full flexion and extension of the finger.  nontender, no swelling, no obvious deformity Extremities: no edema, no cyanosis, no clubbing Pulses: 2+ symmetric, upper and lower extremities, normal cap refill Neurological: alert, oriented x 3, CN2-12 intact, strength normal upper extremities and lower extremities, sensation normal throughout, DTRs 2+ throughout, no cerebellar signs, gait normal Psychiatric: normal affect, behavior normal, pleasant   Medicare Attestation I have personally reviewed: The patient's medical and social history Their use of alcohol, tobacco or illicit drugs Their current medications and supplements The patient's functional ability including ADLs,fall risks, home safety risks, cognitive, and hearing and visual impairment Diet and physical activities Evidence for depression or mood disorders  The patient's weight, height, BMI, and visual acuity have been recorded in the chart.  I have made referrals, counseling, and provided education to the patient based on review of the above and I have provided the patient with a written personalized care plan for preventive services.     Paige Skeans, PA-C   11/02/2016

## 2016-11-02 LAB — LIPID PANEL
CHOL/HDL RATIO: 3.8 ratio (ref <5.0)
Cholesterol: 170 mg/dL (ref <200)
HDL: 45 mg/dL — ABNORMAL LOW (ref >50)
LDL Cholesterol: 66 mg/dL (ref <100)
Triglycerides: 297 mg/dL — ABNORMAL HIGH (ref <150)
VLDL: 59 mg/dL — ABNORMAL HIGH (ref <30)

## 2016-11-02 LAB — CBC WITH DIFFERENTIAL/PLATELET
Basophils Absolute: 80 cells/uL (ref 0–200)
Basophils Relative: 1 %
EOS ABS: 160 {cells}/uL (ref 15–500)
Eosinophils Relative: 2 %
HEMATOCRIT: 43.6 % (ref 35.0–45.0)
HEMOGLOBIN: 14.4 g/dL (ref 11.7–15.5)
LYMPHS ABS: 1840 {cells}/uL (ref 850–3900)
Lymphocytes Relative: 23 %
MCH: 29 pg (ref 27.0–33.0)
MCHC: 33 g/dL (ref 32.0–36.0)
MCV: 87.9 fL (ref 80.0–100.0)
MPV: 11.1 fL (ref 7.5–12.5)
Monocytes Absolute: 480 cells/uL (ref 200–950)
Monocytes Relative: 6 %
NEUTROS PCT: 68 %
Neutro Abs: 5440 cells/uL (ref 1500–7800)
Platelets: 237 10*3/uL (ref 140–400)
RBC: 4.96 MIL/uL (ref 3.80–5.10)
RDW: 14.2 % (ref 11.0–15.0)
WBC: 8 10*3/uL (ref 3.8–10.8)

## 2016-11-02 LAB — HEMOGLOBIN A1C
HEMOGLOBIN A1C: 5.5 % (ref <5.7)
MEAN PLASMA GLUCOSE: 111 mg/dL

## 2016-11-02 LAB — BASIC METABOLIC PANEL WITH GFR
BUN: 16 mg/dL (ref 7–25)
CALCIUM: 9.2 mg/dL (ref 8.6–10.4)
CO2: 26 mmol/L (ref 20–31)
Chloride: 103 mmol/L (ref 98–110)
Creat: 0.77 mg/dL (ref 0.50–0.99)
GFR, EST NON AFRICAN AMERICAN: 81 mL/min (ref >=60)
GFR, Est African American: >89 mL/min (ref >=60)
GLUCOSE: 149 mg/dL — AB (ref 65–99)
Potassium: 4 mmol/L (ref 3.5–5.3)
Sodium: 140 mmol/L (ref 135–146)

## 2016-11-02 LAB — HEPATIC FUNCTION PANEL
ALK PHOS: 60 U/L (ref 33–130)
ALT: 26 U/L (ref 6–29)
AST: 18 U/L (ref 10–35)
Albumin: 4.2 g/dL (ref 3.6–5.1)
BILIRUBIN DIRECT: 0.1 mg/dL (ref <=0.2)
BILIRUBIN INDIRECT: 0.5 mg/dL (ref 0.2–1.2)
Total Bilirubin: 0.6 mg/dL (ref 0.2–1.2)
Total Protein: 6.4 g/dL (ref 6.1–8.1)

## 2016-11-02 LAB — TSH: TSH: 2.67 mIU/L

## 2016-11-11 ENCOUNTER — Other Ambulatory Visit: Payer: Self-pay | Admitting: Internal Medicine

## 2016-12-04 ENCOUNTER — Other Ambulatory Visit: Payer: Self-pay | Admitting: Internal Medicine

## 2016-12-06 ENCOUNTER — Other Ambulatory Visit: Payer: Self-pay | Admitting: Internal Medicine

## 2017-01-09 ENCOUNTER — Other Ambulatory Visit: Payer: Self-pay | Admitting: Physician Assistant

## 2017-01-23 ENCOUNTER — Other Ambulatory Visit: Payer: Self-pay | Admitting: *Deleted

## 2017-01-23 MED ORDER — PRAVASTATIN SODIUM 40 MG PO TABS: 40.0000 mg | ORAL_TABLET | Freq: Every evening | ORAL | 1 refills | Status: DC

## 2017-01-23 MED ORDER — BISOPROLOL-HYDROCHLOROTHIAZIDE 10-6.25 MG PO TABS: 1.0000 | ORAL_TABLET | Freq: Every day | ORAL | 0 refills | Status: DC

## 2017-01-26 ENCOUNTER — Encounter: Payer: Self-pay | Admitting: Internal Medicine

## 2017-01-26 ENCOUNTER — Ambulatory Visit (INDEPENDENT_AMBULATORY_CARE_PROVIDER_SITE_OTHER): Payer: Medicare Other | Admitting: Internal Medicine

## 2017-01-26 VITALS — BP 118/58 | HR 78 | Temp 98.3°F | Resp 16 | Ht 63.0 in | Wt 214.8 lb

## 2017-01-26 DIAGNOSIS — Z136 Encounter for screening for cardiovascular disorders: Secondary | ICD-10-CM

## 2017-01-26 DIAGNOSIS — M940 Chondrocostal junction syndrome [Tietze]: Secondary | ICD-10-CM | POA: Diagnosis not present

## 2017-01-26 DIAGNOSIS — R072 Precordial pain: Secondary | ICD-10-CM | POA: Diagnosis not present

## 2017-01-26 NOTE — Patient Instructions (Signed)
Costochondritis Costochondritis is swelling and irritation (inflammation) of the tissue (cartilage) that connects your ribs to your breastbone (sternum). This causes pain in the front of your chest. The pain usually starts gradually and involves more than one rib. What are the causes? The exact cause of this condition is not always known. It results from stress on the cartilage where your ribs attach to your sternum. The cause of this stress could be:  Chest injury (trauma).  Exercise or activity, such as lifting.  Severe coughing.  What increases the risk? You may be at higher risk for this condition if you:  Are female.  Are 30?67 years old.  Recently started a new exercise or work activity.  Have low levels of vitamin D.  Have a condition that makes you cough frequently.  What are the signs or symptoms? The main symptom of this condition is chest pain. The pain:  Usually starts gradually and can be sharp or dull.  Gets worse with deep breathing, coughing, or exercise.  Gets better with rest.  May be worse when you press on the sternum-rib connection (tenderness).  How is this diagnosed? This condition is diagnosed based on your symptoms, medical history, and a physical exam. Your health care provider will check for tenderness when pressing on your sternum. This is the most important finding. You may also have tests to rule out other causes of chest pain. These may include:  A chest X-ray to check for lung problems.  An electrocardiogram (ECG) to see if you have a heart problem that could be causing the pain.  An imaging scan to rule out a chest or rib fracture.  How is this treated? This condition usually goes away on its own over time. Your health care provider may prescribe an NSAID to reduce pain and inflammation. Your health care provider may also suggest that you:  Rest and avoid activities that make pain worse.  Apply heat or cold to the area to reduce pain  and inflammation.  Do exercises to stretch your chest muscles.  If these treatments do not help, your health care provider may inject a numbing medicine at the sternum-rib connection to help relieve the pain. Follow these instructions at home:  Avoid activities that make pain worse. This includes any activities that use chest, abdominal, and side muscles.  If directed, put ice on the painful area: ? Put ice in a plastic bag. ? Place a towel between your skin and the bag. ? Leave the ice on for 20 minutes, 2-3 times a day.  If directed, apply heat to the affected area as often as told by your health care provider. Use the heat source that your health care provider recommends, such as a moist heat pack or a heating pad. ? Place a towel between your skin and the heat source. ? Leave the heat on for 20-30 minutes. ? Remove the heat if your skin turns bright red. This is especially important if you are unable to feel pain, heat, or cold. You may have a greater risk of getting burned.  Take over-the-counter and prescription medicines only as told by your health care provider.  Return to your normal activities as told by your health care provider. Ask your health care provider what activities are safe for you.  Keep all follow-up visits as told by your health care provider. This is important. Contact a health care provider if:  You have chills or a fever.  Your pain does not go   away or it gets worse.  You have a cough that does not go away (is persistent). Get help right away if:  You have shortness of breath. This information is not intended to replace advice given to you by your health care provider. Make sure you discuss any questions you have with your health care provider. Document Released: 05/11/2005 Document Revised: 02/19/2016 Document Reviewed: 11/25/2015 Elsevier Interactive Patient Education  2018 Elsevier Inc.   

## 2017-01-26 NOTE — Progress Notes (Signed)
'   Subjective:    Patient ID: [name], female    DOB: February 01, 1951, 66 y.o.   MRN: [id number]  HPI  Patient is a very nice 66 yo DWF with hx/o HTN, HLD, T2_DMand GERD who has hx/o Viral myocarditis in 2004 and in 2006 had a negative/Normal heart cath and negative Myoview in 2009. She now presents with a 3-4 day hx/o vague mid chest pressure noted at rest and not associated with exertion or activity. Denies associated nausea, dyspnea or diaphoresis.   Medication Sig  . aspirin EC 81 MG tablet Take 81 mg by mouth daily.  . bisoprolol-hctz 10-6.25  Take 1 tablet by mouth daily.  Marland Kitchen VITAMIN D 5000 UNITS Take 2 tablets by mouth daily.   . INVOKANA 300 MG TABS TAKE 1 TABLET BY MOUTH EVERY DAY  . losartan  50 MG tablet TAKE 1 TABLET(50 MG) BY MOUTH DAILY  . metFORMIN-XR 500 MG  TAKE 4 tabs daily  . Pantoprazole 40 MG tablet TAKE 1 TABLET(40 MG) BY MOUTH DAILY  . pravastatin 40 MG tablet Take 1 tablet (40 mg total) by mouth every evening.   No Known Allergies   Past Medical History:  Diagnosis Date  . Arthritis   . CAD (coronary artery disease)   . Coronary disease    nonobstructive, history of  . Diabetes mellitus without complication ([place])   . Gastric reflux    history of  . Hemorrhoids   . HTN (hypertension)   . Hypercholesterolemia   . Myocardial infarction 2004   slight mi x 1   Past Surgical History:  Procedure Laterality Date  . ANGIOPLASTY    . CARDIAC CATHETERIZATION  November 25 2004   left heart, minor coronary atherosclerosis  . CESAREAN SECTION     x 3  . CHOLECYSTECTOMY  36 yrs ago  . EYE SURGERY Bilateral dec 2010    lens replacements  . KNEE ARTHROSCOPY WITH MEDIAL MENISECTOMY Left 02/25/2014   Procedure: LEFT KNEE ARTHROSCOPY WITH MEDIAL AND LATERAL MENISECTOMY, MICROFRACTURE, SYNOVECTOMY SUPRA PATELLA;  Surgeon: Tobi Bastos, MD;  Location: [facility];  Service: Orthopedics;  Laterality: Left;   Review of Systems  10 point systems review negative except as  above.    Objective:   Physical Exam   BP (!) 118/58   Pulse 78   Temp 98.3 F (36.8 C)   Resp 16   Ht 5\' 3"  (1.6 m)   Wt 214 lb 12.8 oz (97.4 kg)   BMI 38.05 kg/m   In no distress.   Skin unremarkable   HEENT - WNL. Neck - supple.  Chest - Clear equal BS. (+) exquisite tender upper para-sternal CC jts.  Cor - Nl HS. RRR w/o sig MGR. PP 1(+). No edema. MS- FROM w/o deformities.  Gait Nl. Neuro -  Nl w/o focal abnormalities.  EKG - NSR & WNL.     Assessment & Plan:   1. Costochondritis  2. Precordial pain  - EKG 12-Lead  3. Screening for ischemic heart disease  - EKG 12-Lead

## 2017-02-01 ENCOUNTER — Encounter: Payer: Self-pay | Admitting: Internal Medicine

## 2017-02-01 ENCOUNTER — Ambulatory Visit (INDEPENDENT_AMBULATORY_CARE_PROVIDER_SITE_OTHER): Payer: Medicare Other | Admitting: Internal Medicine

## 2017-02-01 VITALS — BP 144/84 | HR 76 | Temp 95.7°F | Resp 16 | Ht 63.0 in | Wt 221.2 lb

## 2017-02-01 DIAGNOSIS — E782 Mixed hyperlipidemia: Secondary | ICD-10-CM | POA: Diagnosis not present

## 2017-02-01 DIAGNOSIS — I10 Essential (primary) hypertension: Secondary | ICD-10-CM | POA: Diagnosis not present

## 2017-02-01 DIAGNOSIS — Z79899 Other long term (current) drug therapy: Secondary | ICD-10-CM

## 2017-02-01 DIAGNOSIS — N182 Chronic kidney disease, stage 2 (mild): Secondary | ICD-10-CM

## 2017-02-01 DIAGNOSIS — E1122 Type 2 diabetes mellitus with diabetic chronic kidney disease: Secondary | ICD-10-CM

## 2017-02-01 DIAGNOSIS — E559 Vitamin D deficiency, unspecified: Secondary | ICD-10-CM

## 2017-02-01 DIAGNOSIS — K219 Gastro-esophageal reflux disease without esophagitis: Secondary | ICD-10-CM

## 2017-02-01 LAB — CBC WITH DIFFERENTIAL/PLATELET
BASOS ABS: 77 {cells}/uL (ref 0–200)
BASOS PCT: 1 %
EOS ABS: 231 {cells}/uL (ref 15–500)
Eosinophils Relative: 3 %
HEMATOCRIT: 45.1 % — AB (ref 35.0–45.0)
Hemoglobin: 14.8 g/dL (ref 11.7–15.5)
LYMPHS PCT: 24 %
Lymphs Abs: 1848 cells/uL (ref 850–3900)
MCH: 28.7 pg (ref 27.0–33.0)
MCHC: 32.8 g/dL (ref 32.0–36.0)
MCV: 87.4 fL (ref 80.0–100.0)
MONO ABS: 385 {cells}/uL (ref 200–950)
MPV: 11.6 fL (ref 7.5–12.5)
Monocytes Relative: 5 %
NEUTROS PCT: 67 %
Neutro Abs: 5159 cells/uL (ref 1500–7800)
Platelets: 276 10*3/uL (ref 140–400)
RBC: 5.16 MIL/uL — ABNORMAL HIGH (ref 3.80–5.10)
RDW: 14 % (ref 11.0–15.0)
WBC: 7.7 10*3/uL (ref 3.8–10.8)

## 2017-02-01 LAB — TSH: TSH: 3.53 m[IU]/L

## 2017-02-01 LAB — LIPID PANEL
Cholesterol: 179 mg/dL (ref <200)
HDL: 50 mg/dL — ABNORMAL LOW (ref >50)
LDL CALC: 78 mg/dL (ref <100)
Total CHOL/HDL Ratio: 3.6 Ratio (ref <5.0)
Triglycerides: 256 mg/dL — ABNORMAL HIGH (ref <150)
VLDL: 51 mg/dL — ABNORMAL HIGH (ref <30)

## 2017-02-01 LAB — BASIC METABOLIC PANEL WITH GFR
BUN: 18 mg/dL (ref 7–25)
CHLORIDE: 106 mmol/L (ref 98–110)
CO2: 18 mmol/L — ABNORMAL LOW (ref 20–31)
CREATININE: 0.89 mg/dL (ref 0.50–0.99)
Calcium: 9.3 mg/dL (ref 8.6–10.4)
GFR, EST AFRICAN AMERICAN: 78 mL/min (ref >=60)
GFR, Est Non African American: 68 mL/min (ref >=60)
GLUCOSE: 142 mg/dL — AB (ref 65–99)
POTASSIUM: 3.9 mmol/L (ref 3.5–5.3)
Sodium: 141 mmol/L (ref 135–146)

## 2017-02-01 LAB — HEPATIC FUNCTION PANEL
ALBUMIN: 4.4 g/dL (ref 3.6–5.1)
ALK PHOS: 65 U/L (ref 33–130)
ALT: 25 U/L (ref 6–29)
AST: 18 U/L (ref 10–35)
Bilirubin, Direct: 0.1 mg/dL (ref <=0.2)
Indirect Bilirubin: 0.6 mg/dL (ref 0.2–1.2)
TOTAL PROTEIN: 6.9 g/dL (ref 6.1–8.1)
Total Bilirubin: 0.7 mg/dL (ref 0.2–1.2)

## 2017-02-01 NOTE — Patient Instructions (Signed)

## 2017-02-01 NOTE — Progress Notes (Signed)
This very nice 66 y.o. DWF presents for 6 month follow up with Hypertension, Hyperlipidemia, Pre-Diabetes and Vitamin D Deficiency. Negative Colonoscopy in 2016 recc 10 yr f/u.     Patient is treated for HTN (1994) & BP has been controlled at home. She has significant hx/o Viral myocarditis in 2004.  In 2006 had a negative/Normal heart cath and again in 2009, she had a negative Myoview. Today's BP is elevated at 144/84. Patient has had no complaints of any cardiac type chest pain, palpitations, dyspnea/orthopnea/PND, dizziness, claudication, or dependent edema.     Hyperlipidemia is controlled with diet & meds. Patient denies myalgias or other med SE's. Last Lipids were at goal albeit elevated Trig's: Lab Results  Component Value Date   CHOL 170 11/01/2016   HDL 45 (L) 11/01/2016   LDLCALC 66 11/01/2016   TRIG 297 (H) 11/01/2016   CHOLHDL 3.8 11/01/2016      Also, the patient has history of Morbid Obesity (BMI 349+) and T2_NIDDM w/CKD2 (GFR 81 ml/min) and has had no symptoms of reactive hypoglycemia, diabetic polys, paresthesias or visual blurring.  Last A1c was at goal: Lab Results  Component Value Date   HGBA1C 5.5 11/01/2016      Further, the patient also has history of Vitamin D Deficiency ("20" in 2008) and supplements vitamin D without any suspected side-effects. Last vitamin D was at goal:  Lab Results  Component Value Date   VD25OH 71 07/19/2016   Current Outpatient Prescriptions on File Prior to Visit  Medication Sig  . aspirin EC 81 MG tablet Take 81 mg by mouth daily.  . bisoprolol-hydrochlorothiazide (ZIAC) 10-6.25 MG tablet Take 1 tablet by mouth daily.  . Cholecalciferol (VITAMIN D-3) 5000 UNITS TABS Take 2 tablets by mouth daily.   . INVOKANA 300 MG TABS tablet TAKE 1 TABLET BY MOUTH EVERY DAY  . losartan (COZAAR) 50 MG tablet TAKE 1 TABLET(50 MG) BY MOUTH DAILY  . metFORMIN (GLUCOPHAGE-XR) 500 MG 24 hr tablet TAKE 1 TABLET BY MOUTH WITH BREAKFAST THEN TAKE 1  TABLET BY MOUTH WITH LUNCH AND TAKE 2 TABLETS BY MOUTH WITH DINNER  . pantoprazole (PROTONIX) 40 MG tablet TAKE 1 TABLET(40 MG) BY MOUTH DAILY  . pravastatin (PRAVACHOL) 40 MG tablet Take 1 tablet (40 mg total) by mouth every evening.   No current facility-administered medications on file prior to visit.    No Known Allergies PMHx:   Past Medical History:  Diagnosis Date  . Arthritis   . CAD (coronary artery disease)   . Coronary disease    nonobstructive, history of  . Diabetes mellitus without complication ([place])   . Gastric reflux    history of  . Hemorrhoids   . HTN (hypertension)   . Hypercholesterolemia   . Myocardial infarction ([place]) 2004   slight mi x 1   Immunization History  Administered Date(s) Administered  . Influenza, High Dose Seasonal PF 04/30/2015  . Influenza-Unspecified 04/15/2013, 05/06/2014  . Pneumococcal Conjugate-13 11/01/2016  . Pneumococcal Polysaccharide-23 04/30/2015  . Tdap 03/11/2014  . Zoster 05/23/2012   Past Surgical History:  Procedure Laterality Date  . ANGIOPLASTY    . CARDIAC CATHETERIZATION  November 25 2004   left heart, minor coronary atherosclerosis  . CESAREAN SECTION     x 3  . CHOLECYSTECTOMY  36 yrs ago  . EYE SURGERY Bilateral dec 2010    lens replacements  . KNEE ARTHROSCOPY WITH MEDIAL MENISECTOMY Left 02/25/2014   Procedure: LEFT KNEE ARTHROSCOPY  WITH MEDIAL AND LATERAL MENISECTOMY, MICROFRACTURE, SYNOVECTOMY SUPRA PATELLA;  Surgeon: Tobi Bastos, MD;  Location: [facility];  Service: Orthopedics;  Laterality: Left;   FHx:    Reviewed / unchanged  SHx:    Reviewed / unchanged  Systems Review:  Constitutional: Denies fever, chills, wt changes, headaches, insomnia, fatigue, night sweats, change in appetite. Eyes: Denies redness, blurred vision, diplopia, discharge, itchy, watery eyes.  ENT: Denies discharge, congestion, post nasal drip, epistaxis, sore throat, earache, hearing loss, dental pain, tinnitus, vertigo, sinus  pain, snoring.  CV: Denies chest pain, palpitations, irregular heartbeat, syncope, dyspnea, diaphoresis, orthopnea, PND, claudication or edema. Respiratory: denies cough, dyspnea, DOE, pleurisy, hoarseness, laryngitis, wheezing.  Gastrointestinal: Denies dysphagia, odynophagia, heartburn, reflux, water brash, abdominal pain or cramps, nausea, vomiting, bloating, diarrhea, constipation, hematemesis, melena, hematochezia  or hemorrhoids. Genitourinary: Denies dysuria, frequency, urgency, nocturia, hesitancy, discharge, hematuria or flank pain. Musculoskeletal: Denies arthralgias, myalgias, stiffness, jt. swelling, pain, limping or strain/sprain.  Skin: Denies pruritus, rash, hives, warts, acne, eczema or change in skin lesion(s). Neuro: No weakness, tremor, incoordination, spasms, paresthesia or pain. Psychiatric: Denies confusion, memory loss or sensory loss. Endo: Denies change in weight, skin or hair change.  Heme/Lymph: No excessive bleeding, bruising or enlarged lymph nodes.  Physical Exam  BP (!) 144/84   Pulse 76   Temp (!) 95.7 F (35.4 C)   Resp 16   Ht 5\' 3"  (1.6 m)   Wt 221 lb 3.2 oz (100.3 kg)   BMI 39.18 kg/m   Appears well nourished, well groomed  and in no distress.  Eyes: PERRLA, EOMs, conjunctiva no swelling or erythema. Sinuses: No frontal/maxillary tenderness ENT/Mouth: EAC's clear, TM's nl w/o erythema, bulging. Nares clear w/o erythema, swelling, exudates. Oropharynx clear without erythema or exudates. Oral hygiene is good. Tongue normal, non obstructing. Hearing intact.  Neck: Supple. Thyroid nl. Car 2+/2+ without bruits, nodes or JVD. Chest: Respirations nl with BS clear & equal w/o rales, rhonchi, wheezing or stridor.  Cor: Heart sounds normal w/ regular rate and rhythm without sig. murmurs, gallops, clicks or rubs. Peripheral pulses normal and equal  without edema.  Abdomen: Soft & bowel sounds normal. Non-tender w/o guarding, rebound, hernias, masses or  organomegaly.  Lymphatics: Unremarkable.  Musculoskeletal: Full ROM all peripheral extremities, joint stability, 5/5 strength and normal gait.  Skin: Warm, dry without exposed rashes, lesions or ecchymosis apparent.  Neuro: Cranial nerves intact, reflexes equal bilaterally. Sensory-motor testing grossly intact. Tendon reflexes grossly intact.  Pysch: Alert & oriented x 3.  Insight and judgement nl & appropriate. No ideations.  Assessment and Plan:  1. Essential hypertension  - Continue medication, monitor blood pressure at home.  - Continue DASH diet. Reminder to go to the ER if any CP,  SOB, nausea, dizziness, severe HA, changes vision/speech,  left arm numbness and tingling and jaw pain.  - CBC with Differential/Platelet - BASIC METABOLIC PANEL WITH GFR - Magnesium - TSH  2. Hyperlipidemia, mixed  - Continue diet/meds, exercise,& lifestyle modifications.  - Continue monitor periodic cholesterol/liver & renal functions  - Hepatic function panel - Lipid panel  3. T2_NIDDM with CKD2  ([place])  - Continue diet, exercise, lifestyle modifications.  - Monitor appropriate labs.  - Hemoglobin A1c - Insulin, random  4. Vitamin D deficiency  - Continue supplementation.  - VITAMIN D 25 Hydroxy   5. Gastroesophageal reflux disease   6. Medication management  - CBC with Differential/Platelet - BASIC METABOLIC PANEL WITH GFR - Hepatic function panel - Magnesium -  Lipid panel - TSH - Hemoglobin A1c - Insulin, random - VITAMIN D 25 Hydroxy        Discussed  regular exercise, BP monitoring, weight control to achieve/maintain BMI less than 25 and discussed med and SE's. Recommended labs to assess and monitor clinical status with further disposition pending results of labs. Over 30 minutes of exam, counseling, chart review was performed.

## 2017-02-02 LAB — HEMOGLOBIN A1C
Hgb A1c MFr Bld: 5.7 % — ABNORMAL HIGH (ref <5.7)
MEAN PLASMA GLUCOSE: 117 mg/dL

## 2017-02-02 LAB — VITAMIN D 25 HYDROXY (VIT D DEFICIENCY, FRACTURES): VIT D 25 HYDROXY: 73 ng/mL (ref 30–100)

## 2017-02-02 LAB — MAGNESIUM: Magnesium: 2 mg/dL (ref 1.5–2.5)

## 2017-02-02 LAB — INSULIN, RANDOM: INSULIN: 36 u[IU]/mL — AB (ref 2.0–19.6)

## 2017-04-21 ENCOUNTER — Other Ambulatory Visit: Payer: Self-pay | Admitting: Internal Medicine

## 2017-05-15 ENCOUNTER — Other Ambulatory Visit: Payer: Self-pay | Admitting: Internal Medicine

## 2017-05-22 NOTE — Progress Notes (Signed)
FOLLOW UP  Assessment and Plan:    Hypertension  Continue medication: bisoprolol-hctz 10-6.25 mg, losartan 50 mg  Monitor blood pressure at home.  Continue DASH diet.    Reminder to go to the ER if any CP, SOB, nausea, dizziness, severe HA, changes vision/speech, left arm numbness and tingling and jaw pain.  Cholesterol  Continue medications: pravastatin 40 mg  Continue diet and exercise.   Check lipid panel.    Prediabetes with diabetic chronic kidney disease  Continue medications: metformin ER 500 mg x4 tabs, invokana 300 mg   Continue diet and exercise.   Perform daily foot/skin check, notify office of any concerning changes.   Check A1C  Vitamin D Def  Continue medications: cholecalciferol 10000 IU daily  Check Vit D level  GERD  Symptoms well managed without breakthrough  Will try to get off PPI given info for taper and zantac sent in   Continue diet and meds as discussed. Further disposition pending results of labs. Discussed med's effects and SE's.   Over 30 minutes of exam, counseling, chart review, and critical decision making was performed.   Future Appointments Date Time Provider [place]  08/29/2017 3:00 PM [name], MD [facility] None    ----------------------------------------------------------------------------------------------------------------------  HPI 66 y.o. female  presents for 3 month follow up on hypertension, cholesterol, diabetes and vitamin D deficiency. Note on PPI for GERD- well managed without breakthrough symptoms, discussed PPI and tapering.   Diet: avoiding fried foods, chooses whole wheat/brown bread, unsweet tea with splenda and water.  Hydration: approximates 60 oz Exercise: discussed getting up to walk for 5 min every hour, checking phone app for steps and working up to 10000  Her blood pressure has been controlled at home, today their BP is BP: 126/72  She does not workout. She denies chest pain,  shortness of breath, dizziness.   She is on cholesterol medication and denies myalgias. Her cholesterol is at goal. The cholesterol last visit was:   Lab Results  Component Value Date   CHOL 179 02/01/2017   HDL 50 (L) 02/01/2017   LDLCALC 78 02/01/2017   TRIG 256 (H) 02/01/2017   CHOLHDL 3.6 02/01/2017    She has been working on diet and exercise for prediabetes, and denies foot ulcerations, hyperglycemia, hypoglycemia , nausea and paresthesia of the feet. Last A1C in the office was near goal:  Lab Results  Component Value Date   HGBA1C 5.7 (H) 02/01/2017   Patient is on Vitamin D supplement and at goal.   Lab Results  Component Value Date   VD25OH 73 02/01/2017     BMI is Body mass index is 37.16 kg/m., she is working on diet and exercise. Wt Readings from Last 3 Encounters:  05/23/17 209 lb 12.8 oz (95.2 kg)  02/01/17 221 lb 3.2 oz (100.3 kg)  01/26/17 214 lb 12.8 oz (97.4 kg)      Current Medications:  Current Outpatient Prescriptions on File Prior to Visit  Medication Sig  . aspirin EC 81 MG tablet Take 81 mg by mouth daily.  . bisoprolol-hydrochlorothiazide (ZIAC) 10-6.25 MG tablet TAKE 1 TABLET BY MOUTH DAILY  . Cholecalciferol (VITAMIN D-3) 5000 UNITS TABS Take 2 tablets by mouth daily.   . INVOKANA 300 MG TABS tablet TAKE 1 TABLET BY MOUTH EVERY DAY  . losartan (COZAAR) 50 MG tablet TAKE 1 TABLET(50 MG) BY MOUTH DAILY  . metFORMIN (GLUCOPHAGE-XR) 500 MG 24 hr tablet Take 1 tablet with Breakfast & Lunch and 2  tablets w/Supper for Diabetes  . pantoprazole (PROTONIX) 40 MG tablet TAKE 1 TABLET(40 MG) BY MOUTH DAILY  . pravastatin (PRAVACHOL) 40 MG tablet Take 1 tablet (40 mg total) by mouth every evening.   No current facility-administered medications on file prior to visit.      Allergies: No Known Allergies   Medical History:  Past Medical History:  Diagnosis Date  . Arthritis   . CAD (coronary artery disease)   . Coronary disease    nonobstructive,  history of  . Diabetes mellitus without complication ([place])   . Gastric reflux    history of  . Hemorrhoids   . HTN (hypertension)   . Hypercholesterolemia   . Myocardial infarction ([place]) 2004   slight mi x 1   Family history- Reviewed and unchanged Social history- Reviewed and unchanged   Review of Systems:  Review of Systems  Constitutional: Negative.   HENT: Negative for congestion, hearing loss and sore throat.   Eyes: Negative for blurred vision.  Respiratory: Negative for cough, shortness of breath and wheezing.   Cardiovascular: Negative for chest pain and palpitations.  Gastrointestinal: Negative for abdominal pain, blood in stool, constipation, diarrhea, heartburn, melena, nausea and vomiting.  Genitourinary: Negative.   Musculoskeletal: Negative for myalgias.  Skin: Negative.   Neurological: Negative for dizziness, sensory change and headaches.  Endo/Heme/Allergies: Negative.   Psychiatric/Behavioral: Negative.       Physical Exam: BP 126/72   Pulse 72   Temp (!) 97.3 F (36.3 C)   Ht 5\' 3"  (1.6 m)   Wt 209 lb 12.8 oz (95.2 kg)   SpO2 97%   BMI 37.16 kg/m  Wt Readings from Last 3 Encounters:  05/23/17 209 lb 12.8 oz (95.2 kg)  02/01/17 221 lb 3.2 oz (100.3 kg)  01/26/17 214 lb 12.8 oz (97.4 kg)   General Appearance: Well nourished,  Obese, in no apparent distress. Eyes: PERRLA, EOMs, conjunctiva no swelling or erythema Sinuses: No Frontal/maxillary tenderness ENT/Mouth: Ext aud canals clear, TMs without erythema, bulging. No erythema, swelling, or exudate on post pharynx.  Tonsils not swollen or erythematous. Hearing normal.  Neck: Supple, thyroid normal.  Respiratory: Respiratory effort normal, BS equal bilaterally without rales, rhonchi, wheezing or stridor.  Cardio: RRR with no MRGs. Brisk peripheral pulses without edema.  Abdomen: Soft, + BS.  Non tender, no guarding, rebound, hernias, masses. Lymphatics: Non tender without lymphadenopathy.   Musculoskeletal: Full ROM, 5/5 strength, Normal gait Skin: Warm, dry without rashes, lesions, ecchymosis.  Neuro: Cranial nerves intact. No cerebellar symptoms.  Psych: Awake and oriented X 3, normal affect, Insight and Judgment appropriate.    Izora Ribas, NP 8:57 AM [facility]

## 2017-05-23 ENCOUNTER — Encounter: Payer: Self-pay | Admitting: Adult Health

## 2017-05-23 ENCOUNTER — Ambulatory Visit (INDEPENDENT_AMBULATORY_CARE_PROVIDER_SITE_OTHER): Payer: Medicare Other | Admitting: Adult Health

## 2017-05-23 VITALS — BP 126/72 | HR 72 | Temp 97.3°F | Ht 63.0 in | Wt 209.8 lb

## 2017-05-23 DIAGNOSIS — I10 Essential (primary) hypertension: Secondary | ICD-10-CM | POA: Diagnosis not present

## 2017-05-23 DIAGNOSIS — K219 Gastro-esophageal reflux disease without esophagitis: Secondary | ICD-10-CM

## 2017-05-23 DIAGNOSIS — Z23 Encounter for immunization: Secondary | ICD-10-CM

## 2017-05-23 DIAGNOSIS — E1122 Type 2 diabetes mellitus with diabetic chronic kidney disease: Secondary | ICD-10-CM

## 2017-05-23 DIAGNOSIS — E559 Vitamin D deficiency, unspecified: Secondary | ICD-10-CM | POA: Diagnosis not present

## 2017-05-23 DIAGNOSIS — N182 Chronic kidney disease, stage 2 (mild): Secondary | ICD-10-CM | POA: Diagnosis not present

## 2017-05-23 DIAGNOSIS — E782 Mixed hyperlipidemia: Secondary | ICD-10-CM | POA: Diagnosis not present

## 2017-05-23 MED ORDER — RANITIDINE HCL 150 MG PO TABS: 150.0000 mg | ORAL_TABLET | Freq: Two times a day (BID) | ORAL | 2 refills | Status: DC

## 2017-05-23 MED ORDER — PANTOPRAZOLE SODIUM 20 MG PO TBEC: 20.0000 mg | DELAYED_RELEASE_TABLET | Freq: Every day | ORAL | 1 refills | Status: DC

## 2017-05-23 NOTE — Patient Instructions (Addendum)
Next time you fill protonix we will go down to 20 mg- stay on that dose for a few weeks, then try to go to every other day for several more weeks. May stop if don't have cough/acid reflux symptoms.   Will start ranitidine 150 mg instead- start taking this when you go down to 20 mg on protonix. May go up to twice a day if needed.   GETTING OFF OF PPI's    Nexium/protonix/prilosec/Omeprazole/Dexilant/Aciphex are called PPI's, they are great at healing your stomach but should only be taken for a short period of time.     Recent studies have shown that taken for a long time they  can increase the risk of osteoporosis (weakening of your bones), pneumonia, low magnesium, restless legs, Cdiff (infection that causes diarrhea), DEMENTIA and most recently kidney damage / disease / insufficiency.     Due to this information we want to try to stop the PPI but if you try to stop it abruptly this can cause rebound acid and worsening symptoms.   So this is how we want you to get off the PPI:  - Start taking the nexium/protonix/prilosec/PPI  every other day with  zantac (ranitidine) 2 x a day for 2-4 weeks - some people stay on this dosage and can not taper off further. Our main goal is to limit the dosage and amount you are taking so if you need to stay on this dose.   - then decrease the PPI to every 3 days while taking the zantac (ranitidine) 300mg  twice a day the other  days for 2-4  Weeks  - then you can try the zantac (ranitidine) 300mg  once at night or up to 2 x day as needed.  - you can continue on this once at night or stop all together  - Avoid alcohol, spicy foods, NSAIDS (aleve, ibuprofen) at this time. See foods below.   +++++++++++++++++++++++++++++++++++++++++++  Food Choices for Gastroesophageal Reflux Disease  When you have gastroesophageal reflux disease (GERD), the foods you eat and your eating habits are very important. Choosing the right foods can help ease the discomfort of  GERD. WHAT GENERAL GUIDELINES DO I NEED TO FOLLOW?  Choose fruits, vegetables, whole grains, low-fat dairy products, and low-fat meat, fish, and poultry.  Limit fats such as oils, salad dressings, butter, nuts, and avocado.  Keep a food diary to identify foods that cause symptoms.  Avoid foods that cause reflux. These may be different for different people.  Eat frequent small meals instead of three large meals each day.  Eat your meals slowly, in a relaxed setting.  Limit fried foods.  Cook foods using methods other than frying.  Avoid drinking alcohol.  Avoid drinking large amounts of liquids with your meals.  Avoid bending over or lying down until 2-3 hours after eating.   WHAT FOODS ARE NOT RECOMMENDED? The following are some foods and drinks that may worsen your symptoms:  Vegetables Tomatoes. Tomato juice. Tomato and spaghetti sauce. Chili peppers. Onion and garlic. Horseradish. Fruits Oranges, grapefruit, and lemon (fruit and juice). Meats High-fat meats, fish, and poultry. This includes hot dogs, ribs, ham, sausage, salami, and bacon. Dairy Whole milk and chocolate milk. Sour cream. Cream. Butter. Ice cream. Cream cheese.  Beverages Coffee and tea, with or without caffeine. Carbonated beverages or energy drinks. Condiments Hot sauce. Barbecue sauce.  Sweets/Desserts Chocolate and cocoa. Donuts. Peppermint and spearmint. Fats and Oils High-fat foods, including [place] fries and potato chips. Other Vinegar. Strong spices,  such as black pepper, white pepper, red pepper, cayenne, curry powder, cloves, ginger, and chili powder. Nexium/protonix/prilosec are called PPI's, they are great at healing your stomach but should only be taken for a short period of time.

## 2017-05-24 LAB — BASIC METABOLIC PANEL WITH GFR
BUN: 23 mg/dL (ref 7–25)
CALCIUM: 9.7 mg/dL (ref 8.6–10.4)
CHLORIDE: 103 mmol/L (ref 98–110)
CO2: 23 mmol/L (ref 20–32)
Creat: 0.72 mg/dL (ref 0.50–0.99)
GFR, EST NON AFRICAN AMERICAN: 87 mL/min/{1.73_m2} (ref >=60)
GFR, Est African American: 100 mL/min/{1.73_m2} (ref >=60)
GLUCOSE: 135 mg/dL — AB (ref 65–99)
POTASSIUM: 4.4 mmol/L (ref 3.5–5.3)
SODIUM: 139 mmol/L (ref 135–146)

## 2017-05-24 LAB — HEPATIC FUNCTION PANEL
AG Ratio: 1.7 (calc) (ref 1.0–2.5)
ALBUMIN MSPROF: 4.3 g/dL (ref 3.6–5.1)
ALKALINE PHOSPHATASE (APISO): 86 U/L (ref 33–130)
ALT: 47 U/L — AB (ref 6–29)
AST: 29 U/L (ref 10–35)
BILIRUBIN TOTAL: 0.9 mg/dL (ref 0.2–1.2)
Bilirubin, Direct: 0.2 mg/dL (ref 0.0–0.2)
Globulin: 2.5 g/dL (calc) (ref 1.9–3.7)
Indirect Bilirubin: 0.7 mg/dL (calc) (ref 0.2–1.2)
TOTAL PROTEIN: 6.8 g/dL (ref 6.1–8.1)

## 2017-05-24 LAB — CBC WITH DIFFERENTIAL/PLATELET
Basophils Absolute: 68 cells/uL (ref 0–200)
Basophils Relative: 0.9 %
EOS PCT: 2.9 %
Eosinophils Absolute: 218 cells/uL (ref 15–500)
HEMATOCRIT: 43.8 % (ref 35.0–45.0)
Hemoglobin: 14.8 g/dL (ref 11.7–15.5)
LYMPHS ABS: 1763 {cells}/uL (ref 850–3900)
MCH: 28.7 pg (ref 27.0–33.0)
MCHC: 33.8 g/dL (ref 32.0–36.0)
MCV: 85 fL (ref 80.0–100.0)
MONOS PCT: 5.6 %
MPV: 11.8 fL (ref 7.5–12.5)
NEUTROS ABS: 5033 {cells}/uL (ref 1500–7800)
NEUTROS PCT: 67.1 %
Platelets: 276 10*3/uL (ref 140–400)
RBC: 5.15 10*6/uL — ABNORMAL HIGH (ref 3.80–5.10)
RDW: 13.2 % (ref 11.0–15.0)
Total Lymphocyte: 23.5 %
WBC mixed population: 420 cells/uL (ref 200–950)
WBC: 7.5 10*3/uL (ref 3.8–10.8)

## 2017-05-24 LAB — LIPID PANEL
CHOLESTEROL: 178 mg/dL (ref <200)
HDL: 52 mg/dL (ref >50)
LDL Cholesterol (Calc): 98 mg/dL (calc)
NON-HDL CHOLESTEROL (CALC): 126 mg/dL (ref <130)
Total CHOL/HDL Ratio: 3.4 (calc) (ref <5.0)
Triglycerides: 184 mg/dL — ABNORMAL HIGH (ref <150)

## 2017-05-24 LAB — TSH: TSH: 2.45 mIU/L (ref 0.40–4.50)

## 2017-05-24 LAB — HEMOGLOBIN A1C
EAG (MMOL/L): 6 (calc)
HEMOGLOBIN A1C: 5.4 %{Hb} (ref <5.7)
MEAN PLASMA GLUCOSE: 108 (calc)

## 2017-05-24 LAB — VITAMIN D 25 HYDROXY (VIT D DEFICIENCY, FRACTURES): Vit D, 25-Hydroxy: 81 ng/mL (ref 30–100)

## 2017-06-02 ENCOUNTER — Other Ambulatory Visit: Payer: Self-pay | Admitting: Internal Medicine

## 2017-07-18 ENCOUNTER — Other Ambulatory Visit: Payer: Self-pay | Admitting: Internal Medicine

## 2017-08-03 LAB — HM MAMMOGRAPHY

## 2017-08-03 LAB — HM DIABETES EYE EXAM

## 2017-08-10 ENCOUNTER — Other Ambulatory Visit: Payer: Self-pay | Admitting: Internal Medicine

## 2017-08-24 ENCOUNTER — Encounter: Payer: Self-pay | Admitting: *Deleted

## 2017-08-29 ENCOUNTER — Ambulatory Visit (INDEPENDENT_AMBULATORY_CARE_PROVIDER_SITE_OTHER): Payer: Medicare Other | Admitting: Internal Medicine

## 2017-08-29 ENCOUNTER — Encounter: Payer: Self-pay | Admitting: Internal Medicine

## 2017-08-29 VITALS — BP 110/76 | HR 72 | Temp 97.3°F | Ht 62.0 in | Wt 213.8 lb

## 2017-08-29 DIAGNOSIS — E1122 Type 2 diabetes mellitus with diabetic chronic kidney disease: Secondary | ICD-10-CM

## 2017-08-29 DIAGNOSIS — Z0001 Encounter for general adult medical examination with abnormal findings: Secondary | ICD-10-CM

## 2017-08-29 DIAGNOSIS — Z0000 Encounter for general adult medical examination without abnormal findings: Secondary | ICD-10-CM

## 2017-08-29 DIAGNOSIS — I10 Essential (primary) hypertension: Secondary | ICD-10-CM

## 2017-08-29 DIAGNOSIS — Z136 Encounter for screening for cardiovascular disorders: Secondary | ICD-10-CM

## 2017-08-29 DIAGNOSIS — Z79899 Other long term (current) drug therapy: Secondary | ICD-10-CM

## 2017-08-29 DIAGNOSIS — E559 Vitamin D deficiency, unspecified: Secondary | ICD-10-CM

## 2017-08-29 DIAGNOSIS — K219 Gastro-esophageal reflux disease without esophagitis: Secondary | ICD-10-CM

## 2017-08-29 DIAGNOSIS — Z1212 Encounter for screening for malignant neoplasm of rectum: Secondary | ICD-10-CM

## 2017-08-29 DIAGNOSIS — E782 Mixed hyperlipidemia: Secondary | ICD-10-CM

## 2017-08-29 DIAGNOSIS — M5431 Sciatica, right side: Secondary | ICD-10-CM

## 2017-08-29 DIAGNOSIS — Z1211 Encounter for screening for malignant neoplasm of colon: Secondary | ICD-10-CM

## 2017-08-29 DIAGNOSIS — N182 Chronic kidney disease, stage 2 (mild): Secondary | ICD-10-CM

## 2017-08-29 MED ORDER — GABAPENTIN 300 MG PO CAPS: ORAL_CAPSULE | ORAL | 3 refills | Status: DC

## 2017-08-29 NOTE — Patient Instructions (Signed)

## 2017-08-29 NOTE — Progress Notes (Signed)
[name] [name], M.D.     [name]. [name], P.A.-C [name], [place] [address] [place] [address] Telephone [phone] Telefax [phone] Annual Screening/Preventative Visit & Comprehensive Evaluation &  Examination     This very nice 67 y.o. DWF  presents for a Screening/Preventative Visit & comprehensive evaluation and management of multiple medical co-morbidities.  Patient has been followed for HTN, Prediabetes, Hyperlipidemia and Vitamin D Deficiency. Patient has GERD controlled w/diet & meds. Patient is also c/o of a 2-3 week prodrome of Rt sciatica pain worse w/ recumbency at night.       HTN predates since 62. Patient's BP has been controlled at home. 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 Patient denies any cardiac symptoms as chest pain, palpitations, shortness of breath, dizziness or ankle swelling. Today's BP is at goal - 110/76.      Patient's hyperlipidemia is controlled with diet and medications. Patient denies myalgias or other medication SE's. Last lipids were at goal albeit  elevated Trig's: Lab Results  Component Value Date   CHOL 209 (H) 08/29/2017   HDL 52 08/29/2017   LDLCALC 78 02/01/2017   TRIG 327 (H) 08/29/2017   CHOLHDL 4.0 08/29/2017      Patient has Morbid Obesity (BMI 39+) and consequent T2_NIDDM (2007 ) w/CKD2  and patient denies reactive hypoglycemic symptoms, visual blurring, diabetic polys, or paresthesias. She is actually only taking # 2 Metformin /daily and her last A1c was Normal & at goal: Lab Results  Component Value Date   HGBA1C 5.4 05/23/2017      Finally, patient has history of Vitamin D Deficiency ("20"/2008) and last Vitamin D was at goal: Lab Results  Component Value Date   VD25OH 81 05/23/2017   Current Outpatient Medications on  File Prior to Visit  Medication Sig  . aspirin EC 81 MG tablet Take 81 mg by mouth daily.  . bisoprolol-hydrochlorothiazide (ZIAC) 10-6.25 MG tablet TAKE 1 TABLET BY MOUTH DAILY  . Cholecalciferol (VITAMIN D-3) 5000 UNITS TABS Take 2 tablets by mouth daily.   . INVOKANA 300 MG TABS tablet TAKE 1 TABLET BY MOUTH EVERY DAY  . losartan (COZAAR) 50 MG tablet TAKE 1 TABLET(50 MG) BY MOUTH DAILY  . metFORMIN (GLUCOPHAGE-XR) 500 MG 24 hr tablet Take 1 tablet with Breakfast & Lunch and 2 tablets w/Supper for Diabetes  . pantoprazole (PROTONIX) 40 MG tablet TAKE 1 TABLET(40 MG) BY MOUTH DAILY  . pravastatin (PRAVACHOL) 40 MG tablet TAKE 1 TABLET(40 MG) BY MOUTH EVERY EVENING  . ranitidine (ZANTAC) 150 MG tablet Take 1 tablet (150 mg total) by mouth 2 (two) times daily.   No current facility-administered medications on file prior to visit.    No Known Allergies   Past Medical History:  Diagnosis Date  . Arthritis   . CAD (coronary artery disease)   . Coronary disease    nonobstructive, history of  . Diabetes mellitus without complication ([place])   . Gastric reflux    history of  . Hemorrhoids   . HTN (hypertension)   . Hypercholesterolemia   . Myocardial infarction ([place]) 2004   slight mi x 1   Health Maintenance  Topic Date Due  . FOOT EXAM  07/19/2017  . HEMOGLOBIN A1C  11/21/2017  . MAMMOGRAM  07/30/2018  . OPHTHALMOLOGY EXAM  08/03/2018  . TETANUS/TDAP  03/11/2024  . COLONOSCOPY  01/22/2025  . INFLUENZA VACCINE  Completed  . DEXA SCAN  Completed  . Hepatitis C Screening  Completed  . PNA vac Low Risk Adult  Completed   Immunization History  Administered Date(s) Administered  . Influenza, High Dose Seasonal PF 04/30/2015, 05/23/2017  . Influenza-Unspecified 04/15/2013, 05/06/2014  . Pneumococcal Conjugate-13 11/01/2016  . Pneumococcal Polysaccharide-23 04/30/2015  . Tdap 03/11/2014  . Zoster 05/23/2012   Last Colon - 2016 recommending 10 year f/u (2026) Last Pap -  Last  [facility] - Dec 2018  Past Surgical History:  Procedure Laterality Date  . ANGIOPLASTY    . CARDIAC CATHETERIZATION  November 25 2004   left heart, minor coronary atherosclerosis  . CESAREAN SECTION     x 3  . CHOLECYSTECTOMY  36 yrs ago  . EYE SURGERY Bilateral dec 2010    lens replacements  . KNEE ARTHROSCOPY WITH MEDIAL MENISECTOMY Left 02/25/2014   Procedure: LEFT KNEE ARTHROSCOPY WITH MEDIAL AND LATERAL MENISECTOMY, MICROFRACTURE, SYNOVECTOMY SUPRA PATELLA;  Surgeon: Tobi Bastos, MD;  Location: [facility];  Service: Orthopedics;  Laterality: Left;   Family History  Problem Relation Age of Onset  . Hypertension Mother   . COPD Mother   . Thyroid disease Mother   . Heart attack Father   . Heart disease Father   . Arthritis Sister        Rheumatoid  . Hypertension Sister   . Graves' disease Sister   . Thyroid disease Sister   . Colon cancer Maternal Grandfather    Social History   Tobacco Use  . Smoking status: Never Smoker  . Smokeless tobacco: Never Used  Substance Use Topics  . Alcohol use: Yes    Alcohol/week: 0.0 oz    Comment: rarel; twice yearly  . Drug use: No    ROS Constitutional: Denies fever, chills, weight loss/gain, headaches, insomnia,  night sweats, and change in appetite. Does c/o fatigue. Eyes: Denies redness, blurred vision, diplopia, discharge, itchy, watery eyes.  ENT: Denies discharge, congestion, post nasal drip, epistaxis, sore throat, earache, hearing loss, dental pain, Tinnitus, Vertigo, Sinus pain, snoring.  Cardio: Denies chest pain, palpitations, irregular heartbeat, syncope, dyspnea, diaphoresis, orthopnea, PND, claudication, edema Respiratory: denies cough, dyspnea, DOE, pleurisy, hoarseness, laryngitis, wheezing.  Gastrointestinal: Denies dysphagia, heartburn, reflux, water brash, pain, cramps, nausea, vomiting, bloating, diarrhea, constipation, hematemesis, melena, hematochezia, jaundice, hemorrhoids Genitourinary: Denies dysuria, frequency,  urgency, nocturia, hesitancy, discharge, hematuria, flank pain Breast: Breast lumps, nipple discharge, bleeding.  Musculoskeletal: Denies arthralgia, myalgia, stiffness, Jt. Swelling, pain, limp, and strain/sprain. Denies falls. Skin: Denies puritis, rash, hives, warts, acne, eczema, changing in skin lesion Neuro: No weakness, tremor, incoordination, spasms, paresthesia, pain Psychiatric: Denies confusion, memory loss, sensory loss. Denies Depression. Endocrine: Denies change in weight, skin, hair change, nocturia, and paresthesia, diabetic polys, visual blurring, hyper / hypo glycemic episodes.  Heme/Lymph: No excessive bleeding, bruising, enlarged lymph nodes.  Physical Exam  BP 110/76   Pulse 72   Temp (!) 97.3 F (36.3 C)   Ht 5\' 2"  (1.575 m)   Wt 213 lb 12.8 oz (97 kg)   SpO2 97%   BMI 39.10 kg/m   General Appearance: Well nourished, well groomed and in no apparent distress.  Eyes: Bilat pseudophakia, PERRLA, EOMs, conjunctiva no swelling or erythema, normal fundi and vessels. Sinuses: No frontal/maxillary tenderness ENT/Mouth: EACs patent / TMs  nl. Nares clear without erythema, swelling, mucoid exudates. Oral hygiene is good. No erythema,  swelling, or exudate. Tongue normal, non-obstructing. Tonsils not swollen or erythematous. Hearing normal.  Neck: Supple, thyroid normal. No bruits, nodes or JVD. Respiratory: Respiratory effort normal.  BS equal and clear bilateral without rales, rhonci, wheezing or stridor. Cardio: Heart sounds are normal with regular rate and rhythm and no murmurs, rubs or gallops. Peripheral pulses are normal and equal bilaterally without edema. No aortic or femoral bruits. Chest: symmetric with normal excursions and percussion. Breasts: Symmetric, without lumps, nipple discharge, retractions, or fibrocystic changes.  Abdomen: Flat, soft with bowel sounds active. Nontender, no guarding, rebound, hernias, masses, or organomegaly.  Lymphatics: Non tender  without lymphadenopathy.  Genitourinary:  Musculoskeletal: Full ROM all peripheral extremities, joint stability, 5/5 strength, and normal gait. Skin: Warm and dry without rashes, lesions, cyanosis, clubbing or  ecchymosis.  Neuro: Cranial nerves intact, reflexes equal bilaterally. Normal muscle tone, no cerebellar symptoms. Sensation intact to touch, vibratory and Monofilament to the toes bilaterally. (+) SLR on the right. Pysch: Alert and oriented X 3, normal affect, Insight and Judgment appropriate.   Assessment and Plan  1. Annual Preventative Screening Examination  2. Essential hypertension  - EKG 12-Lead - Urinalysis, Routine w reflex microscopic - Microalbumin / creatinine urine ratio - CBC with Differential/Platelet - BASIC METABOLIC PANEL WITH GFR - Magnesium - TSH  3. Hyperlipidemia, mixed  - EKG 12-Lead - Hepatic function panel - Lipid panel - TSH  4. Type 2 diabetes mellitus with stage 2 chronic kidney disease, without long-term current use of insulin ([place])  - Apparently Invokana was added  By Kelby Aline, PA assuming patient was taking #4 Metformins daily, so she is advised to stop the Invokana and increase her Metformin up to #4 tabs / daily.   - EKG 12-Lead - Hemoglobin A1c - Insulin, random  5. Vitamin D deficiency  - VITAMIN D 25 Hydroxy   6. Right sided sciatica  - gabapentin (NEURONTIN) 300 MG capsule; Take 1 capsule 3 x / day for sciatica pain  Dispense: 270 capsule; Refill: 3  7. Gastroesophageal reflux disease  - CBC with Differential/Platelet  8. Screening for ischemic heart disease  - EKG 12-Lead  9. Screening for colorectal cancer  - POC Hemoccult Bld/Stl   10. Medication management  - Urinalysis, Routine w reflex microscopic - Microalbumin / creatinine urine ratio - CBC with Differential/Platelet - BASIC METABOLIC PANEL WITH GFR - Hepatic function panel - Magnesium - Lipid panel - TSH - Hemoglobin A1c - Insulin, random -  VITAMIN D 25 Hydroxy        Patient was counseled in prudent diet to achieve/maintain BMI less than 25 for weight control, BP monitoring, regular exercise and medications. Discussed med's effects and SE's. Screening labs and tests as requested with regular follow-up as recommended. Over 40 minutes of exam, counseling, chart review and high complex critical decision making was performed.

## 2017-08-30 ENCOUNTER — Other Ambulatory Visit: Payer: Self-pay | Admitting: Internal Medicine

## 2017-08-30 DIAGNOSIS — E782 Mixed hyperlipidemia: Secondary | ICD-10-CM

## 2017-08-30 LAB — HEPATIC FUNCTION PANEL
AG RATIO: 2.1 (calc) (ref 1.0–2.5)
ALKALINE PHOSPHATASE (APISO): 77 U/L (ref 33–130)
ALT: 27 U/L (ref 6–29)
AST: 16 U/L (ref 10–35)
Albumin: 4.7 g/dL (ref 3.6–5.1)
BILIRUBIN TOTAL: 0.7 mg/dL (ref 0.2–1.2)
Bilirubin, Direct: 0.1 mg/dL (ref 0.0–0.2)
GLOBULIN: 2.2 g/dL (ref 1.9–3.7)
Indirect Bilirubin: 0.6 mg/dL (calc) (ref 0.2–1.2)
Total Protein: 6.9 g/dL (ref 6.1–8.1)

## 2017-08-30 LAB — BASIC METABOLIC PANEL WITH GFR
BUN: 17 mg/dL (ref 7–25)
CO2: 28 mmol/L (ref 20–32)
Calcium: 10.1 mg/dL (ref 8.6–10.4)
Chloride: 101 mmol/L (ref 98–110)
Creat: 0.69 mg/dL (ref 0.50–0.99)
GFR, EST NON AFRICAN AMERICAN: 90 mL/min/{1.73_m2} (ref >=60)
GFR, Est African American: 104 mL/min/{1.73_m2} (ref >=60)
Glucose, Bld: 108 mg/dL — ABNORMAL HIGH (ref 65–99)
Potassium: 4.1 mmol/L (ref 3.5–5.3)
Sodium: 139 mmol/L (ref 135–146)

## 2017-08-30 LAB — MICROALBUMIN / CREATININE URINE RATIO
CREATININE, URINE: 67 mg/dL (ref 20–275)
MICROALB UR: 1.3 mg/dL
Microalb Creat Ratio: 19 mcg/mg creat (ref <30)

## 2017-08-30 LAB — VITAMIN D 25 HYDROXY (VIT D DEFICIENCY, FRACTURES): VIT D 25 HYDROXY: 52 ng/mL (ref 30–100)

## 2017-08-30 LAB — URINALYSIS, ROUTINE W REFLEX MICROSCOPIC
BACTERIA UA: NONE SEEN /HPF
BILIRUBIN URINE: NEGATIVE
HYALINE CAST: NONE SEEN /LPF
Hgb urine dipstick: NEGATIVE
Ketones, ur: NEGATIVE
Nitrite: NEGATIVE
PROTEIN: NEGATIVE
RBC / HPF: NONE SEEN /HPF (ref 0–2)
SQUAMOUS EPITHELIAL / LPF: NONE SEEN /HPF (ref <=5)
Specific Gravity, Urine: 1.034 (ref 1.001–1.03)
pH: 5.5 (ref 5.0–8.0)

## 2017-08-30 LAB — CBC WITH DIFFERENTIAL/PLATELET
BASOS ABS: 51 {cells}/uL (ref 0–200)
BASOS PCT: 0.6 %
EOS ABS: 213 {cells}/uL (ref 15–500)
Eosinophils Relative: 2.5 %
HEMATOCRIT: 43.6 % (ref 35.0–45.0)
HEMOGLOBIN: 14.8 g/dL (ref 11.7–15.5)
LYMPHS ABS: 2244 {cells}/uL (ref 850–3900)
MCH: 28.7 pg (ref 27.0–33.0)
MCHC: 33.9 g/dL (ref 32.0–36.0)
MCV: 84.7 fL (ref 80.0–100.0)
MPV: 11.7 fL (ref 7.5–12.5)
Monocytes Relative: 5.9 %
Neutro Abs: 5491 cells/uL (ref 1500–7800)
Neutrophils Relative %: 64.6 %
Platelets: 266 10*3/uL (ref 140–400)
RBC: 5.15 10*6/uL — ABNORMAL HIGH (ref 3.80–5.10)
RDW: 13.2 % (ref 11.0–15.0)
Total Lymphocyte: 26.4 %
WBC mixed population: 502 cells/uL (ref 200–950)
WBC: 8.5 10*3/uL (ref 3.8–10.8)

## 2017-08-30 LAB — HEMOGLOBIN A1C
HEMOGLOBIN A1C: 5.7 %{Hb} — AB (ref <5.7)
Mean Plasma Glucose: 117 (calc)
eAG (mmol/L): 6.5 (calc)

## 2017-08-30 LAB — LIPID PANEL
CHOL/HDL RATIO: 4 (calc) (ref <5.0)
Cholesterol: 209 mg/dL — ABNORMAL HIGH (ref <200)
HDL: 52 mg/dL (ref >50)
LDL CHOLESTEROL (CALC): 113 mg/dL — AB
NON-HDL CHOLESTEROL (CALC): 157 mg/dL — AB (ref <130)
Triglycerides: 327 mg/dL — ABNORMAL HIGH (ref <150)

## 2017-08-30 LAB — MAGNESIUM: Magnesium: 2.2 mg/dL (ref 1.5–2.5)

## 2017-08-30 LAB — INSULIN, RANDOM: Insulin: 30.1 u[IU]/mL — ABNORMAL HIGH (ref 2.0–19.6)

## 2017-08-30 LAB — TSH: TSH: 3.12 mIU/L (ref 0.40–4.50)

## 2017-08-30 MED ORDER — ROSUVASTATIN CALCIUM 40 MG PO TABS: ORAL_TABLET | ORAL | 5 refills | Status: DC

## 2017-08-31 ENCOUNTER — Encounter: Payer: Self-pay | Admitting: Internal Medicine

## 2017-09-27 ENCOUNTER — Other Ambulatory Visit: Payer: Self-pay

## 2017-09-27 DIAGNOSIS — Z1212 Encounter for screening for malignant neoplasm of rectum: Principal | ICD-10-CM

## 2017-09-27 DIAGNOSIS — Z1211 Encounter for screening for malignant neoplasm of colon: Secondary | ICD-10-CM

## 2017-09-27 LAB — POC HEMOCCULT BLD/STL (HOME/3-CARD/SCREEN)
Card #2 Fecal Occult Blod, POC: NEGATIVE
Card #3 Fecal Occult Blood, POC: NEGATIVE
FECAL OCCULT BLD: NEGATIVE

## 2017-09-28 DIAGNOSIS — Z1212 Encounter for screening for malignant neoplasm of rectum: Secondary | ICD-10-CM

## 2017-11-13 ENCOUNTER — Other Ambulatory Visit: Payer: Self-pay | Admitting: Internal Medicine

## 2017-12-01 ENCOUNTER — Other Ambulatory Visit: Payer: Self-pay | Admitting: Internal Medicine

## 2017-12-11 ENCOUNTER — Encounter: Payer: Self-pay | Admitting: Adult Health

## 2017-12-11 DIAGNOSIS — K219 Gastro-esophageal reflux disease without esophagitis: Secondary | ICD-10-CM | POA: Insufficient documentation

## 2017-12-11 NOTE — Progress Notes (Signed)
MEDICARE ANNUAL WELLNESS VISIT AND FOLLOW UP  Assessment:   Diagnoses and all orders for this visit:  Encounter for Medicare annual wellness exam  Takotsubo syndrome No symptoms for over 5 years Has been released by cardiology  Stress-induced cardiomyopathy  Has been released by cardiology  Hemorrhoids, unspecified hemorrhoid type Asymptomatic at this time; use anusol PRN  Essential hypertension Continue medications Monitor blood pressure at home; call if consistently over 130/80 Continue DASH diet.   Reminder to go to the ER if any CP, SOB, nausea, dizziness, severe HA, changes vision/speech, left arm numbness and tingling and jaw pain.  Type 2 diabetes mellitus with stage 2 chronic kidney disease, without long-term current use of insulin [facility]) Education: Reviewed 'ABCs' of diabetes management (respective goals in parentheses):  A1C (<7), blood pressure (<130/80), and cholesterol (LDL <70) Eye Exam yearly and Dental Exam every 6 months- up to date Invokana d/c'd, metformin increased to 4 tabs daily Dietary recommendations Physical Activity recommendations Foot exam completed today  DJD Managed by OTC analgesics Followed by [facility] Dr. Gladstone Lighter, Dr. Veverly Fells Weight loss advised  Vitamin D deficiency Continue supplementation Check vitamin D level q 6 months or as needed  Morbid obesity (BMI 39)  Long discussion about weight loss, diet, and exercise Recommended diet heavy in fruits and veggies and low in animal meats, cheeses, and dairy products, appropriate calorie intake Discussed appropriate weight for height  Follow up at next visit  Medication management CBC, CMP/GFR  Hyperlipidemia Continue medications Continue low cholesterol diet and exercise.  Check lipid panel.   Gastroesophageal reflux disease, esophagitis presence not specified Well managed on current medications Discussed diet, avoiding triggers and other lifestyle changes  Sciatica Discussed  may increase gabapentin at night to 600 mg, continue with tylenol/NSAIDs Stretches for sciatica demonstrated and provided on discharge May contact for referral for PT if desired  Estrogen deficiency DEXA ordered   Over 40 minutes of exam, counseling, chart review and critical decision making was performed Future Appointments  Date Time Provider [place]  03/14/2018  9:30 AM [name], MD [facility] None  09/27/2018  3:00 PM [name], MD [facility] None     Plan:   During the course of the visit the patient was educated and counseled about appropriate screening and preventive services including:    Pneumococcal vaccine   Prevnar 13  Influenza vaccine  Td vaccine  Screening electrocardiogram  Bone densitometry screening  Colorectal cancer screening  Diabetes screening  Glaucoma screening  Nutrition counseling   Advanced directives: requested   Subjective:  [name] is a 67 y.o. female who presents for Medicare Annual Wellness Visit and 3 month follow up. 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 Has been released by cardiology.   She reports experiencing sciatica/shooting pains and tingling through her hip and posterior right thigh at night x 3-4 months for which she is taking gabapentin 300 mg nightly and tylenol which is helpful. She denies lumbar pain, distal numbness/tingling or weakness. She is established with orthopedics but has not discussed with them.   she has a diagnosis of GERD which is currently managed by ranitidine 150 mg BID  she reports symptoms is currently well controlled, and denies breakthrough reflux, burning in chest, hoarseness or cough.    BMI is Body mass index is 39.87 kg/m., she has not been working on diet  and exercise. Wt Readings from Last 3 Encounters:  12/12/17 218 lb (98.9 kg)  08/29/17 213 lb 12.8 oz  (97 kg)  05/23/17 209 lb 12.8 oz (95.2 kg)    Her blood pressure has been controlled at home, today their BP is BP: 120/76 She does not workout. She denies chest pain, shortness of breath, dizziness.   She is on cholesterol medication (rosuvastatin 20 mg daily) and denies myalgias. Her cholesterol is not at goal. The cholesterol last visit was:   Lab Results  Component Value Date   CHOL 209 (H) 08/29/2017   HDL 52 08/29/2017   LDLCALC 113 (H) 08/29/2017   TRIG 327 (H) 08/29/2017   CHOLHDL 4.0 08/29/2017    She has not been working on diet and exercise for T2DM well controlled by metformin and invokana, and denies foot ulcerations, increased appetite, nausea, paresthesia of the feet, polydipsia, polyuria, visual disturbances, vomiting and weight loss. Last A1C in the office was:  Lab Results  Component Value Date   HGBA1C 5.7 (H) 08/29/2017   Last GFR: Lab Results  Component Value Date   GFRNONAA 90 08/29/2017   Patient is on Vitamin D supplement but remained below goal of 70 at recent check:    Lab Results  Component Value Date   VD25OH 52 08/29/2017      Medication Review: Current Outpatient Medications on File Prior to Visit  Medication Sig Dispense Refill  . aspirin EC 81 MG tablet Take 81 mg by mouth daily.    . bisoprolol-hydrochlorothiazide (ZIAC) 10-6.25 MG tablet TAKE 1 TABLET BY MOUTH DAILY 90 tablet 0  . Cholecalciferol (VITAMIN D-3) 5000 UNITS TABS Take 2 tablets by mouth daily.     Marland Kitchen gabapentin (NEURONTIN) 300 MG capsule Take 1 capsule 3 x / day for sciatica pain (Patient taking differently: Take 1 capsule at night for sciatica pain) 270 capsule 3  . losartan (COZAAR) 50 MG tablet TAKE 1 TABLET(50 MG) BY MOUTH DAILY 90 tablet 3  . metFORMIN (GLUCOPHAGE-XR) 500 MG 24 hr tablet Take 1 tablet with Breakfast & Lunch and 2 tablets w/Supper for Diabetes 360 tablet 3  . ranitidine (ZANTAC) 150 MG tablet Take 1 tablet (150 mg total) by mouth 2 (two) times daily. 90 tablet  2  . rosuvastatin (CRESTOR) 40 MG tablet Take 1/2 to 1 tablet daily or as directed for Cholesterol 30 tablet 5  . INVOKANA 300 MG TABS tablet TAKE 1 TABLET BY MOUTH EVERY DAY (Patient not taking: Reported on 12/12/2017) 90 tablet 1  . pantoprazole (PROTONIX) 40 MG tablet TAKE 1 TABLET(40 MG) BY MOUTH DAILY (Patient not taking: Reported on 12/12/2017) 90 tablet 1   No current facility-administered medications on file prior to visit.     No Known Allergies  Current Problems (verified) Patient Active Problem List   Diagnosis Date Noted  . Acid reflux 12/11/2017  . Takotsubo syndrome 07/16/2015  . Morbid obesity (BMI 39)  06/02/2014  . Osteoarthritis of left knee 02/25/2014  . Hyperlipidemia 12/08/2013  . Vitamin D deficiency 12/08/2013  . Medication management 12/08/2013  . T2_NIDDM w/Stage 2 CKD (GFR 76 ml/min) 09/13/2013  . Stress-induced cardiomyopathy 11/18/2010  . Essential hypertension 10/29/2007  . Hemorrhoids 10/29/2007  . DJD 10/29/2007    Screening Tests Immunization History  Administered Date(s) Administered  . Influenza, High Dose Seasonal PF 04/30/2015, 05/23/2017  . Influenza-Unspecified 04/15/2013, 05/06/2014  . Pneumococcal Conjugate-13 11/01/2016  . Pneumococcal Polysaccharide-23 04/30/2015  . Tdap 03/11/2014  . Zoster 05/23/2012  Preventative care: Last colonoscopy: 2016 Pap: Remote, wants one last one - will schedule with CPE or 3 month DEXA: Has had, no report, was normal  MM: 07/2017  Prior vaccinations: TD or Tdap: 2015  Influenza: 2018  Pneumococcal: 2016 Prevnar13: 2018 Shingles/Zostavax: 2013  Names of Other Physician/Practitioners you currently use: 1. [facility] here for primary care 2. Dr. Delman Cheadle, eye doctor, last visit 08/03/2017 - report received and abstracted  3. Dr. Orvil Feil, dentist, last visit 09/2017  Patient Care Team: [name], MD as PCP - General (Internal Medicine) Melissa Noon, [place]  as Referring Physician (Optometry)  SURGICAL HISTORY She  has a past surgical history that includes Cardiac catheterization (November 25 2004); Angioplasty; Cesarean section; Cholecystectomy (36 yrs ago); Eye surgery (Bilateral, dec 2010); and Knee arthroscopy with medial menisectomy (Left, 02/25/2014). FAMILY HISTORY Her family history includes Arthritis in her sister; COPD in her mother; Colon cancer in her maternal grandfather; Berenice Primas' disease in her sister; Heart attack in her father; Heart disease in her father; Hypertension in her mother and sister; Thyroid disease in her mother and sister. SOCIAL HISTORY She  reports that she has never smoked. She has never used smokeless tobacco. She reports that she drinks alcohol. She reports that she does not use drugs.   MEDICARE WELLNESS OBJECTIVES: Physical activity: Current Exercise Habits: Home exercise routine, Type of exercise: walking, Time (Minutes): 20, Frequency (Times/Week): 7, Weekly Exercise (Minutes/Week): 140, Intensity: Mild, Exercise limited by: None identified Cardiac risk factors: Cardiac Risk Factors include: advanced age (>76men, >51 women);diabetes mellitus;dyslipidemia;hypertension;obesity (BMI >30kg/m2) Depression/mood screen:   Depression screen [facility] 2/9 12/12/2017  Decreased Interest 0  Down, Depressed, Hopeless 0  PHQ - 2 Score 0    ADLs:  In your present state of health, do you have any difficulty performing the following activities: 12/12/2017 08/29/2017  Hearing? N N  Vision? N N  Difficulty concentrating or making decisions? N N  Walking or climbing stairs? N N  Dressing or bathing? N N  Doing errands, shopping? N N  Some recent data might be hidden     Cognitive Testing  Alert? Yes  Normal Appearance?Yes  Oriented to person? Yes  Place? Yes   Time? Yes  Recall of three objects?  Yes  Can perform simple calculations? Yes  Displays appropriate judgment?Yes  Can read the correct time from a watch face?Yes  EOL  planning: Does Patient Have a Medical Advance Directive?: No Would patient like information on creating a medical advance directive?: Yes (MAU/Ambulatory/Procedural Areas - Information given)  Review of Systems  Constitutional: Negative for malaise/fatigue and weight loss.  HENT: Negative for hearing loss and tinnitus.   Eyes: Negative for blurred vision and double vision.  Respiratory: Negative for cough, sputum production, shortness of breath and wheezing.   Cardiovascular: Negative for chest pain, palpitations, orthopnea, claudication, leg swelling and PND.  Gastrointestinal: Negative for abdominal pain, blood in stool, constipation, diarrhea, heartburn, melena, nausea and vomiting.  Genitourinary: Negative.   Musculoskeletal: Negative for falls, joint pain and myalgias.  Skin: Negative for rash.  Neurological: Positive for tingling (right hip and post. thigh). Negative for dizziness, sensory change, weakness and headaches.  Endo/Heme/Allergies: Negative for polydipsia.  Psychiatric/Behavioral: Negative.  Negative for depression, memory loss, substance abuse and suicidal ideas. The patient is not nervous/anxious and does not have insomnia.   All other systems reviewed and are negative.    Objective:     Today's Vitals   12/12/17 0854  BP:  120/76  Pulse: (!) 53  Temp: (!) 97.5 F (36.4 C)  SpO2: 98%  Weight: 218 lb (98.9 kg)  Height: 5\' 2"  (1.575 m)  PainSc: 6   PainLoc: Hip   Body mass index is 39.87 kg/m.  General appearance: alert, no distress, WD/WN, female HEENT: normocephalic, sclerae anicteric, TMs pearly, nares patent, no discharge or erythema, pharynx normal Oral cavity: MMM, no lesions Neck: supple, no lymphadenopathy, no thyromegaly, no masses Heart: RRR, normal S1, S2, no murmurs Lungs: CTA bilaterally, no wheezes, rhonchi, or rales Abdomen: +bs, soft, non tender, non distended, no masses, no hepatomegaly, no splenomegaly Musculoskeletal: nontender, no  swelling, no obvious deformity Extremities: no edema, no cyanosis, no clubbing Pulses: 2+ symmetric, upper and lower extremities, normal cap refill Neurological: alert, oriented x 3, CN2-12 intact, strength normal upper extremities and lower extremities, sensation normal throughout, DTRs 2+ throughout, no cerebellar signs, gait normal Psychiatric: normal affect, behavior normal, pleasant   Medicare Attestation I have personally reviewed: The patient's medical and social history Their use of alcohol, tobacco or illicit drugs Their current medications and supplements The patient's functional ability including ADLs,fall risks, home safety risks, cognitive, and hearing and visual impairment Diet and physical activities Evidence for depression or mood disorders  The patient's weight, height, BMI, and visual acuity have been recorded in the chart.  I have made referrals, counseling, and provided education to the patient based on review of the above and I have provided the patient with a written personalized care plan for preventive services.     Izora Ribas, NP   12/12/2017

## 2017-12-12 ENCOUNTER — Encounter: Payer: Self-pay | Admitting: Adult Health

## 2017-12-12 ENCOUNTER — Ambulatory Visit: Payer: Medicare Other | Admitting: Adult Health

## 2017-12-12 VITALS — BP 120/76 | HR 53 | Temp 97.5°F | Ht 62.0 in | Wt 218.0 lb

## 2017-12-12 DIAGNOSIS — I5181 Takotsubo syndrome: Secondary | ICD-10-CM

## 2017-12-12 DIAGNOSIS — M5431 Sciatica, right side: Secondary | ICD-10-CM

## 2017-12-12 DIAGNOSIS — R6889 Other general symptoms and signs: Secondary | ICD-10-CM | POA: Diagnosis not present

## 2017-12-12 DIAGNOSIS — E559 Vitamin D deficiency, unspecified: Secondary | ICD-10-CM

## 2017-12-12 DIAGNOSIS — Z0001 Encounter for general adult medical examination with abnormal findings: Secondary | ICD-10-CM | POA: Diagnosis not present

## 2017-12-12 DIAGNOSIS — K649 Unspecified hemorrhoids: Secondary | ICD-10-CM

## 2017-12-12 DIAGNOSIS — E782 Mixed hyperlipidemia: Secondary | ICD-10-CM | POA: Diagnosis not present

## 2017-12-12 DIAGNOSIS — E1122 Type 2 diabetes mellitus with diabetic chronic kidney disease: Secondary | ICD-10-CM

## 2017-12-12 DIAGNOSIS — M159 Polyosteoarthritis, unspecified: Secondary | ICD-10-CM | POA: Diagnosis not present

## 2017-12-12 DIAGNOSIS — Z79899 Other long term (current) drug therapy: Secondary | ICD-10-CM | POA: Diagnosis not present

## 2017-12-12 DIAGNOSIS — K219 Gastro-esophageal reflux disease without esophagitis: Secondary | ICD-10-CM | POA: Diagnosis not present

## 2017-12-12 DIAGNOSIS — I10 Essential (primary) hypertension: Secondary | ICD-10-CM | POA: Diagnosis not present

## 2017-12-12 DIAGNOSIS — E2839 Other primary ovarian failure: Secondary | ICD-10-CM

## 2017-12-12 DIAGNOSIS — N182 Chronic kidney disease, stage 2 (mild): Secondary | ICD-10-CM | POA: Diagnosis not present

## 2017-12-12 DIAGNOSIS — Z0000 Encounter for general adult medical examination without abnormal findings: Secondary | ICD-10-CM

## 2017-12-12 HISTORY — DX: Sciatica, right side: M54.31

## 2017-12-12 LAB — CBC WITH DIFFERENTIAL/PLATELET
BASOS ABS: 58 {cells}/uL (ref 0–200)
Basophils Relative: 0.8 %
Eosinophils Absolute: 161 cells/uL (ref 15–500)
Eosinophils Relative: 2.2 %
HEMATOCRIT: 42.2 % (ref 35.0–45.0)
HEMOGLOBIN: 14.5 g/dL (ref 11.7–15.5)
LYMPHS ABS: 1737 {cells}/uL (ref 850–3900)
MCH: 29.8 pg (ref 27.0–33.0)
MCHC: 34.4 g/dL (ref 32.0–36.0)
MCV: 86.8 fL (ref 80.0–100.0)
MPV: 11.5 fL (ref 7.5–12.5)
Monocytes Relative: 6.9 %
NEUTROS ABS: 4840 {cells}/uL (ref 1500–7800)
NEUTROS PCT: 66.3 %
Platelets: 249 10*3/uL (ref 140–400)
RBC: 4.86 10*6/uL (ref 3.80–5.10)
RDW: 13 % (ref 11.0–15.0)
Total Lymphocyte: 23.8 %
WBC: 7.3 10*3/uL (ref 3.8–10.8)
WBCMIX: 504 {cells}/uL (ref 200–950)

## 2017-12-12 LAB — TSH: TSH: 3.73 mIU/L (ref 0.40–4.50)

## 2017-12-12 MED ORDER — METFORMIN HCL ER 500 MG PO TB24: ORAL_TABLET | ORAL | 3 refills | Status: DC

## 2017-12-12 NOTE — Patient Instructions (Signed)
Aim for 7+ servings of fruits and vegetables daily  80+ fluid ounces of water or unsweet tea for healthy kidneys  Limit alcohol intake  Limit animal fats in diet for cholesterol and heart health - choose grass fed whenever available  Aim for low stress - take time to unwind and care for your mental health  Aim for 150 min of moderate intensity exercise weekly for heart health, and weights twice weekly for bone health  Aim for 7-9 hours of sleep daily    Spondylolysis Rehab Ask your health care provider which exercises are safe for you. Do exercises exactly as told by your health care provider and adjust them as directed. It is normal to feel mild stretching, pulling, tightness, or discomfort as you do these exercises, but you should stop right away if you feel sudden pain or your pain gets worse. Do not begin these exercises until told by your health care provider. Stretching and range of motion exercises These exercises warm up your muscles and joints and improve the movement and flexibility of your hips and your back. These exercises may also help to relieve pain, numbness, and tingling. Exercise A: Single knee to chest  1. Lie on your back on a firm surface with both legs straight. 2. Bend one of your knees. Use your hands to move your knee up toward your chest until you feel a gentle stretch in your lower back and buttock. ? Hold your leg in this position by holding onto the front of your knee. ? Keep your other leg as straight as possible. 3. Hold for __________ seconds. 4. Slowly return to the starting position. 5. Repeat this exercise with your other leg. Repeat __________ times. Complete this exercise __________ times a day. Exercise B: Hamstring stretch, supine  1. Lie on your back. 2. Hold both ends of a belt or towel as you loop it over the ball of one of your feet. The ball of your foot is on the walking surface, right under your toes. 3. Straighten your knee and slowly  pull on the belt to raise your leg. ? Do not let your knee bend while you do this. ? Keep your other leg flat on the floor. ? Raise the leg until you feel a gentle stretch in the back of your knee or thigh. 4. Hold for __________ seconds. 5. If told by your health care provider, repeat this exercise with your other leg. Repeat __________ times. Complete this exercise __________ times a day. Strengthening exercises These exercises build strength and endurance in your back. Endurance is the ability to use your muscles for a long time, even after they get tired. Exercise C: Pelvic tilt 1. Lie on your back on a firm bed or the floor. Bend your knees and keep your feet flat. 2. Tense your abdominal muscles. Tip your pelvis up toward the ceiling and flatten your lower back into the floor. ? To help with this exercise, you may place a small towel under your lower back and try to push your back into the towel. 3. Hold for __________ seconds. 4. Let your muscles relax completely before you repeat this exercise. Repeat __________ times. Complete this exercise __________ times a day. Exercise D: Abdominal crunch  1. Lie on your back on a firm surface. Bend your knees and keep your feet flat. Cross your arms over your chest. 2. Tuck your chin down toward your chest, without bending your neck. 3. Use your abdominal muscles to lift  your upper body off of the ground, straight up into the air. ? Try to lift yourself until your shoulder blades are off the ground. You may need to work up to this. ? Keep your lower back on the ground while you crunch upward. ? Do not hold your breath. 4. Slowly lower yourself down. Keep your abdominal muscles tense until you are back to the starting position. Repeat __________ times. Complete this exercise __________ times a day. Exercise E: Alternating arm and leg raises  1. Get on your hands and knees on a firm surface. If you are on a hard floor, you may want to use  padding to cushion your knees, such as an exercise mat. 2. Line up your arms and legs. Your hands should be below your shoulders, and your knees should be below your hips. 3. Lift your left leg behind you. At the same time, raise your right arm and straighten it in front of you. ? Do not lift your leg higher than your hip. ? Do not lift your arm higher than your shoulder. ? Keep your abdominal and back muscles tight. ? Keep your hips facing the ground. ? Do not arch your back. ? Keep your balance carefully, and do not hold your breath. 4. Hold for __________ seconds. 5. Slowly return to the starting position and repeat with your right leg and your left arm. Repeat __________ times. Complete this exercise __________ times a day. Posture and body mechanics Body mechanics refers to the movements and positions of your body while you do your daily activities. Posture is part of body mechanics. Good posture and healthy body mechanics can help to relieve stress in your body's tissues and joints. Good posture means that your spine is in its natural S-curve position (your spine is neutral), your shoulders are pulled back slightly, and your head is not tipped forward. The following are general guidelines for applying improved posture and body mechanics to your everyday activities. Standing   When standing, keep your spine neutral and your feet about hip-width apart. Keep a slight bend in your knees. Your ears, shoulders, and hips should line up with each other.  When you do a task in which you stand in one place for a long time, place one foot up on a stable object that is 2-4 inches (5-10 cm) high, such as a footstool. This helps keep your spine neutral. Sitting   When sitting, keep your spine neutral and keep your feet flat on the floor. Use a footrest, if necessary, and keep your thighs parallel to the floor. Avoid rounding your shoulders, and avoid tilting your head forward.  When working at a  desk or a computer, keep your desk at a height where your hands are slightly lower than your elbows. Slide your chair under your desk so you are close enough to maintain good posture.  When working at a computer, place your monitor at a height where you are looking straight ahead and you do not have to tilt your head forward or downward to look at the screen. Resting  When lying down and resting, avoid positions that are most painful for you.  If you have pain with activities such as sitting, bending, stooping, or squatting (flexion-based activities), lie in a position in which your body does not bend very much. For example, avoid curling up on your side with your arms and knees near your chest (fetal position).  If you have pain with activities such as standing  for a long time or reaching with your arms (extension-based activities), lie with your spine in a neutral position and bend your knees slightly. Try the following positions: ? Lying on your side with a pillow between your knees. ? Lying on your back with a pillow under your knees.  Lifting   When lifting objects, keep your feet at least shoulder-width apart and tighten your abdominal muscles.  Bend your knees and hips and keep your spine neutral. It is important to lift using the strength of your legs, not your back. Do not lock your knees straight out.  Always ask for help to lift heavy or awkward objects. This information is not intended to replace advice given to you by your health care provider. Make sure you discuss any questions you have with your health care provider. Document Released: 08/01/2005 Document Revised: 04/07/2016 Document Reviewed: 05/12/2015 Elsevier Interactive Patient Education  [employer].

## 2017-12-13 LAB — COMPLETE METABOLIC PANEL WITH GFR
AG Ratio: 2 (calc) (ref 1.0–2.5)
ALBUMIN MSPROF: 4.7 g/dL (ref 3.6–5.1)
ALT: 24 U/L (ref 6–29)
AST: 16 U/L (ref 10–35)
Alkaline phosphatase (APISO): 74 U/L (ref 33–130)
BUN: 13 mg/dL (ref 7–25)
CALCIUM: 9.7 mg/dL (ref 8.6–10.4)
CO2: 24 mmol/L (ref 20–32)
CREATININE: 0.75 mg/dL (ref 0.50–0.99)
Chloride: 105 mmol/L (ref 98–110)
GFR, EST NON AFRICAN AMERICAN: 82 mL/min/{1.73_m2} (ref >=60)
GFR, Est African American: 96 mL/min/{1.73_m2} (ref >=60)
GLOBULIN: 2.4 g/dL (ref 1.9–3.7)
Glucose, Bld: 131 mg/dL — ABNORMAL HIGH (ref 65–99)
Potassium: 4.3 mmol/L (ref 3.5–5.3)
Sodium: 139 mmol/L (ref 135–146)
Total Bilirubin: 1 mg/dL (ref 0.2–1.2)
Total Protein: 7.1 g/dL (ref 6.1–8.1)

## 2017-12-13 LAB — LIPID PANEL
CHOL/HDL RATIO: 2.9 (calc) (ref <5.0)
Cholesterol: 147 mg/dL (ref <200)
HDL: 50 mg/dL — AB (ref >50)
LDL Cholesterol (Calc): 68 mg/dL (calc)
NON-HDL CHOLESTEROL (CALC): 97 mg/dL (ref <130)
Triglycerides: 218 mg/dL — ABNORMAL HIGH (ref <150)

## 2017-12-13 LAB — HEMOGLOBIN A1C
HEMOGLOBIN A1C: 5.8 %{Hb} — AB (ref <5.7)
Mean Plasma Glucose: 120 (calc)
eAG (mmol/L): 6.6 (calc)

## 2018-02-03 LAB — HM DEXA SCAN: HM DEXA SCAN: NORMAL

## 2018-02-05 ENCOUNTER — Encounter: Payer: Self-pay | Admitting: Internal Medicine

## 2018-02-09 ENCOUNTER — Encounter: Payer: Self-pay | Admitting: *Deleted

## 2018-02-10 ENCOUNTER — Other Ambulatory Visit: Payer: Self-pay | Admitting: Physician Assistant

## 2018-03-14 ENCOUNTER — Ambulatory Visit: Payer: Medicare Other | Admitting: Internal Medicine

## 2018-03-14 ENCOUNTER — Encounter: Payer: Self-pay | Admitting: Internal Medicine

## 2018-03-14 VITALS — BP 114/80 | HR 60 | Temp 97.0°F | Resp 16 | Ht 62.0 in | Wt 225.8 lb

## 2018-03-14 DIAGNOSIS — E782 Mixed hyperlipidemia: Secondary | ICD-10-CM

## 2018-03-14 DIAGNOSIS — N182 Chronic kidney disease, stage 2 (mild): Secondary | ICD-10-CM

## 2018-03-14 DIAGNOSIS — E559 Vitamin D deficiency, unspecified: Secondary | ICD-10-CM | POA: Diagnosis not present

## 2018-03-14 DIAGNOSIS — I10 Essential (primary) hypertension: Secondary | ICD-10-CM | POA: Diagnosis not present

## 2018-03-14 DIAGNOSIS — E1122 Type 2 diabetes mellitus with diabetic chronic kidney disease: Secondary | ICD-10-CM

## 2018-03-14 DIAGNOSIS — Z79899 Other long term (current) drug therapy: Secondary | ICD-10-CM

## 2018-03-14 DIAGNOSIS — Z6841 Body Mass Index (BMI) 40.0 and over, adult: Secondary | ICD-10-CM

## 2018-03-14 NOTE — Progress Notes (Addendum)
This very nice 67 y.o. DWF presents for 6 month follow up with HTN, HLD, Pre-Diabetes and Vitamin D Deficiency. Patient's GERD is controlled with diet & meds.      Patient is treated for HTN (1994) & BP has been controlled at home. Today's BP is at goal - 114/80. Patient has hx/o a Viral Myocarditis in 2004 and in 2006 had a Normal Heart Cath and in 2009 a negative Myoview. Patient has had no complaints of any cardiac type chest pain, palpitations, dyspnea / orthopnea / PND, dizziness, claudication, or dependent edema.     Hyperlipidemia is controlled with diet & meds. Patient denies myalgias or other med SE's. Last Lipids were at goal albeit elevated Trig's: Lab Results  Component Value Date   CHOL 147 12/12/2017   HDL 50 (L) 12/12/2017   LDLCALC 68 12/12/2017   TRIG 218 (H) 12/12/2017   CHOLHDL 2.9 12/12/2017      Also, the patient has history of Morbid Obesity (BMI 41) and T2_NIDDM since 2007 & hx/o CKD2.  She has had no symptoms of reactive hypoglycemia, diabetic polys, paresthesias or visual blurring.  Last A1c was at goal: Lab Results  Component Value Date   HGBA1C 5.8 (H) 12/12/2017      Further, the patient also has history of Vitamin D Deficiency ("20"/2008) and supplements vitamin D without any suspected side-effects. Last vitamin D was sl low (goal 70-100):  Lab Results  Component Value Date   VD25OH 52 08/29/2017   Current Outpatient Medications on File Prior to Visit  Medication Sig  . aspirin EC 81 MG tablet Take 81 mg by mouth daily.  . bisoprolol-hydrochlorothiazide (ZIAC) 10-6.25 MG tablet TAKE 1 TABLET BY MOUTH DAILY  . Cholecalciferol (VITAMIN D-3) 5000 UNITS TABS Take 2 tablets by mouth daily.   Marland Kitchen gabapentin (NEURONTIN) 300 MG capsule Take 1 capsule 3 x / day for sciatica pain (Patient taking differently: Take 1 capsule at night for sciatica pain)  . losartan (COZAAR) 50 MG tablet TAKE 1 TABLET(50 MG) BY MOUTH DAILY  . metFORMIN (GLUCOPHAGE-XR) 500 MG 24 hr  tablet Take 2 tablet with Breakfast and 2 tablets w/Supper for Diabetes  . ranitidine (ZANTAC) 150 MG tablet Take 1 tablet (150 mg total) by mouth 2 (two) times daily.  . rosuvastatin (CRESTOR) 40 MG tablet Take 1/2 to 1 tablet daily or as directed for Cholesterol   No current facility-administered medications on file prior to visit.    No Known Allergies PMHx:   Past Medical History:  Diagnosis Date  . Arthritis   . CAD (coronary artery disease)   . Coronary disease    nonobstructive, history of  . Diabetes mellitus without complication ([place])   . Gastric reflux    history of  . Hemorrhoids   . HTN (hypertension)   . Hypercholesterolemia   . Myocardial infarction ([place]) 2004   slight mi x 1   Immunization History  Administered Date(s) Administered  . Influenza, High Dose Seasonal PF 04/30/2015, 05/23/2017  . Influenza-Unspecified 04/15/2013, 05/06/2014  . Pneumococcal Conjugate-13 11/01/2016  . Pneumococcal Polysaccharide-23 04/30/2015  . Tdap 03/11/2014  . Zoster 05/23/2012   Past Surgical History:  Procedure Laterality Date  . ANGIOPLASTY    . CARDIAC CATHETERIZATION  November 25 2004   left heart, minor coronary atherosclerosis  . CESAREAN SECTION     x 3  . CHOLECYSTECTOMY  36 yrs ago  . EYE SURGERY Bilateral dec 2010    lens replacements  .  KNEE ARTHROSCOPY WITH MEDIAL MENISECTOMY Left 02/25/2014   Procedure: LEFT KNEE ARTHROSCOPY WITH MEDIAL AND LATERAL MENISECTOMY, MICROFRACTURE, SYNOVECTOMY SUPRA PATELLA;  Surgeon: Tobi Bastos, MD;  Location: [facility];  Service: Orthopedics;  Laterality: Left;   FHx:    Reviewed / unchanged  SHx:    Reviewed / unchanged   Systems Review:  Constitutional: Denies fever, chills, wt changes, headaches, insomnia, fatigue, night sweats, change in appetite. Eyes: Denies redness, blurred vision, diplopia, discharge, itchy, watery eyes.  ENT: Denies discharge, congestion, post nasal drip, epistaxis, sore throat, earache, hearing  loss, dental pain, tinnitus, vertigo, sinus pain, snoring.  CV: Denies chest pain, palpitations, irregular heartbeat, syncope, dyspnea, diaphoresis, orthopnea, PND, claudication or edema. Respiratory: denies cough, dyspnea, DOE, pleurisy, hoarseness, laryngitis, wheezing.  Gastrointestinal: Denies dysphagia, odynophagia, heartburn, reflux, water brash, abdominal pain or cramps, nausea, vomiting, bloating, diarrhea, constipation, hematemesis, melena, hematochezia  or hemorrhoids. Genitourinary: Denies dysuria, frequency, urgency, nocturia, hesitancy, discharge, hematuria or flank pain. Musculoskeletal: Denies arthralgias, myalgias, stiffness, jt. swelling, pain, limping or strain/sprain.  Skin: Denies pruritus, rash, hives, warts, acne, eczema or change in skin lesion(s). Neuro: No weakness, tremor, incoordination, spasms, paresthesia or pain. Psychiatric: Denies confusion, memory loss or sensory loss. Endo: Denies change in weight, skin or hair change.  Heme/Lymph: No excessive bleeding, bruising or enlarged lymph nodes.  Physical Exam  BP 114/80   Pulse 60   Temp (!) 97 F (36.1 C)   Resp 16   Ht 5\' 2"  (1.575 m)   Wt 225 lb 12.8 oz (102.4 kg)   BMI 41.30 kg/m   Appears  well nourished, well groomed  and in no distress.  Eyes: PERRLA, EOMs, conjunctiva no swelling or erythema. Sinuses: No frontal/maxillary tenderness ENT/Mouth: EAC's clear, TM's nl w/o erythema, bulging. Nares clear w/o erythema, swelling, exudates. Oropharynx clear without erythema or exudates. Oral hygiene is good. Tongue normal, non obstructing. Hearing intact.  Neck: Supple. Thyroid not palpable. Car 2+/2+ without bruits, nodes or JVD. Chest: Respirations nl with BS clear & equal w/o rales, rhonchi, wheezing or stridor.  Cor: Heart sounds normal w/ regular rate and rhythm without sig. murmurs, gallops, clicks or rubs. Peripheral pulses normal and equal  without edema.  Abdomen: Soft & bowel sounds normal.  Non-tender w/o guarding, rebound, hernias, masses or organomegaly.  Lymphatics: Unremarkable.  Musculoskeletal: Full ROM all peripheral extremities, joint stability, 5/5 strength and normal gait.  Skin: Warm, dry without exposed rashes, lesions or ecchymosis apparent.  Neuro: Cranial nerves intact, reflexes equal bilaterally. Sensory-motor testing grossly intact. Tendon reflexes grossly intact.  Pysch: Alert & oriented x 3.  Insight and judgement nl & appropriate. No ideations.  Assessment and Plan:  1. Essential hypertension  - Continue medication, monitor blood pressure at home.  - Continue DASH diet.  Reminder to go to the ER if any CP,  SOB, nausea, dizziness, severe HA, changes vision/speech.  - CBC with Differential/Platelet - COMPLETE METABOLIC PANEL WITH GFR - Magnesium - TSH  2. Hyperlipidemia, mixed  - Continue diet/meds, exercise,& lifestyle modifications.  - Continue monitor periodic cholesterol/liver & renal functions   - Lipid panel - TSH  3. Type 2 diabetes mellitus with stage 2 chronic kidney disease, without long-term current use of insulin (HCC)  - Continue diet, exercise, lifestyle modifications.  - Monitor appropriate labs.  - Hemoglobin A1c - Insulin, random  4. Vitamin D deficiency  - Continue supplementation.  - VITAMIN D 25 Hydroxyl  5. Medication management  - CBC with Differential/Platelet -  COMPLETE METABOLIC PANEL WITH GFR - Magnesium - Lipid panel - TSH - Hemoglobin A1c - Insulin, random - VITAMIN D 25 Hydroxyl        Discussed  regular exercise, BP monitoring, weight control to achieve/maintain BMI less than 25 and discussed med and SE's. Recommended labs to assess and monitor clinical status with further disposition pending results of labs. Over 30 minutes of exam, counseling, chart review was performed.

## 2018-03-14 NOTE — Patient Instructions (Signed)

## 2018-03-15 LAB — LIPID PANEL
CHOL/HDL RATIO: 2.8 (calc) (ref <5.0)
CHOLESTEROL: 146 mg/dL (ref <200)
HDL: 52 mg/dL (ref >50)
LDL CHOLESTEROL (CALC): 67 mg/dL
Non-HDL Cholesterol (Calc): 94 mg/dL (calc) (ref <130)
Triglycerides: 208 mg/dL — ABNORMAL HIGH (ref <150)

## 2018-03-15 LAB — CBC WITH DIFFERENTIAL/PLATELET
BASOS ABS: 68 {cells}/uL (ref 0–200)
Basophils Relative: 0.9 %
EOS ABS: 218 {cells}/uL (ref 15–500)
Eosinophils Relative: 2.9 %
HCT: 42.2 % (ref 35.0–45.0)
Hemoglobin: 14.3 g/dL (ref 11.7–15.5)
Lymphs Abs: 2040 cells/uL (ref 850–3900)
MCH: 29 pg (ref 27.0–33.0)
MCHC: 33.9 g/dL (ref 32.0–36.0)
MCV: 85.6 fL (ref 80.0–100.0)
MONOS PCT: 6 %
MPV: 12.1 fL (ref 7.5–12.5)
Neutro Abs: 4725 cells/uL (ref 1500–7800)
Neutrophils Relative %: 63 %
PLATELETS: 249 10*3/uL (ref 140–400)
RBC: 4.93 10*6/uL (ref 3.80–5.10)
RDW: 12.7 % (ref 11.0–15.0)
TOTAL LYMPHOCYTE: 27.2 %
WBC mixed population: 450 cells/uL (ref 200–950)
WBC: 7.5 10*3/uL (ref 3.8–10.8)

## 2018-03-15 LAB — COMPLETE METABOLIC PANEL WITH GFR
AG Ratio: 2.1 (calc) (ref 1.0–2.5)
ALBUMIN MSPROF: 4.7 g/dL (ref 3.6–5.1)
ALT: 22 U/L (ref 6–29)
AST: 16 U/L (ref 10–35)
Alkaline phosphatase (APISO): 58 U/L (ref 33–130)
BUN: 20 mg/dL (ref 7–25)
CALCIUM: 9.7 mg/dL (ref 8.6–10.4)
CO2: 23 mmol/L (ref 20–32)
CREATININE: 0.75 mg/dL (ref 0.50–0.99)
Chloride: 105 mmol/L (ref 98–110)
GFR, EST NON AFRICAN AMERICAN: 82 mL/min/{1.73_m2} (ref >=60)
GFR, Est African American: 95 mL/min/{1.73_m2} (ref >=60)
GLOBULIN: 2.2 g/dL (ref 1.9–3.7)
Glucose, Bld: 136 mg/dL — ABNORMAL HIGH (ref 65–99)
Potassium: 4.4 mmol/L (ref 3.5–5.3)
SODIUM: 140 mmol/L (ref 135–146)
TOTAL PROTEIN: 6.9 g/dL (ref 6.1–8.1)
Total Bilirubin: 0.9 mg/dL (ref 0.2–1.2)

## 2018-03-15 LAB — TSH: TSH: 3.81 mIU/L (ref 0.40–4.50)

## 2018-03-15 LAB — VITAMIN D 25 HYDROXY (VIT D DEFICIENCY, FRACTURES): Vit D, 25-Hydroxy: 93 ng/mL (ref 30–100)

## 2018-03-15 LAB — HEMOGLOBIN A1C
HEMOGLOBIN A1C: 5.9 %{Hb} — AB (ref <5.7)
Mean Plasma Glucose: 123 (calc)
eAG (mmol/L): 6.8 (calc)

## 2018-03-15 LAB — INSULIN, RANDOM: Insulin: 10.1 u[IU]/mL (ref 2.0–19.6)

## 2018-03-15 LAB — MAGNESIUM: Magnesium: 1.8 mg/dL (ref 1.5–2.5)

## 2018-04-10 ENCOUNTER — Ambulatory Visit: Payer: Medicare Other | Admitting: Physician Assistant

## 2018-04-10 ENCOUNTER — Encounter: Payer: Self-pay | Admitting: Physician Assistant

## 2018-04-10 VITALS — BP 128/82 | HR 74 | Temp 97.2°F | Ht 62.0 in | Wt 224.0 lb

## 2018-04-10 DIAGNOSIS — J0100 Acute maxillary sinusitis, unspecified: Secondary | ICD-10-CM

## 2018-04-10 MED ORDER — PREDNISONE 20 MG PO TABS: ORAL_TABLET | ORAL | 0 refills | Status: DC

## 2018-04-10 MED ORDER — AZITHROMYCIN 250 MG PO TABS: ORAL_TABLET | ORAL | 1 refills | Status: AC

## 2018-04-10 NOTE — Patient Instructions (Signed)
Try just the allergy pill and prednisone for a few days, you always want to give your body 10 days to fight off infection with help before taking an anabiotic.   Antibiotics have been linked with colon infections, resistance and newest theory is colon cancer in 40-50 year olds. So it is VERY important to try to avoid them, antibiotics are NOT risk free medications.   -Symptoms usually last at least 1 week with the worst symptoms being around day 4.  - colds usually start with a sore throat and end with a cough, and the cough can take 2 weeks to get better.  -No antibiotics are needed for colds, flu, sore throats, cough, bronchitis UNLESS symptoms are longer than 7 days OR if you are getting better then get drastically worse.  -There are a lot of combination medications (Dayquil, Nyquil, Vicks 44, tyelnol cold and sinus, ETC). Please look at the ingredients on the back so that you are treating the correct symptoms and not doubling up on medications/ingredients.    Medicines you can use  Nasal congestion  Little Remedies saline spray (aerosol/mist)- can try this, it is in the kids section - pseudoephedrine (Sudafed)- behind the counter, do not use if you have high blood pressure, medicine that have -D in them.  - phenylephrine (Sudafed PE) -Dextormethorphan + chlorpheniramine (Coridcidin HBP)- okay if you have high blood pressure -Oxymetazoline (Afrin) nasal spray- LIMIT to 3 days -Saline nasal spray -Neti pot (used distilled or bottled water)  Ear pain/congestion  -pseudoephedrine (sudafed) - Nasonex/flonase nasal spray  Fever  -Acetaminophen (Tyelnol) -Ibuprofen (Advil, motrin, aleve)  Sore Throat  -Acetaminophen (Tyelnol) -Ibuprofen (Advil, motrin, aleve) -Drink a lot of water -Gargle with salt water - Rest your voice (don't talk) -Throat sprays -Cough drops  Body Aches  -Acetaminophen (Tyelnol) -Ibuprofen (Advil, motrin, aleve)  Headache  -Acetaminophen  (Tyelnol) -Ibuprofen (Advil, motrin, aleve) - Exedrin, Exedrin Migraine  Allergy symptoms (cough, sneeze, runny nose, itchy eyes) -Claritin or loratadine cheapest but likely the weakest  -Zyrtec or certizine at night because it can make you sleepy -The strongest is allegra or fexafinadine  Cheapest at [employer], [employer], [employer]  Cough  -Dextromethorphan (Delsym)- medicine that has DM in it -Guafenesin (Mucinex/Robitussin) - cough drops - drink lots of water  Chest Congestion  -Guafenesin (Mucinex/Robitussin)  Red Itchy Eyes  - Naphcon-A  Upset Stomach  - Bland diet (nothing spicy, greasy, fried, and high acid foods like tomatoes, oranges, berries) -OKAY- cereal, bread, soup, crackers, rice -Eat smaller more frequent meals -reduce caffeine, no alcohol -Loperamide (Imodium-AD) if diarrhea -Prevacid for heart burn  General health when sick  -Hydration -wash your hands frequently -keep surfaces clean -change pillow cases and sheets often -Get fresh air but do not exercise strenuously -Vitamin D, double up on it - Vitamin C -Zinc  Common causes of cough OR hoarseness OR sore throat:   Allergies, Viral Infections, Acid Reflux and Bacterial Infections.   Allergies and viral infections cause a cough OR sore throat by post nasal drip and are often worse at night, can also have sneezing, lower grade fevers, clear/yellow mucus. This is best treated with allergy medications or nasal sprays.  Please get on allegra for 1-2 weeks The strongest is allegra or fexafinadine  Cheapest at [employer], [employer], [employer]   Bacterial infections are more severe than allergies or viral infections with fever, teeth pain, fatigue. This can be treated with prednisone and the same over the counter medication and after 7 days can be  treated with an antibiotic.   Silent reflux/GERD can cause a cough OR sore throat OR hoarseness WITHOUT heart burn because the esophagus that goes to the stomach and trachea that  goes to the lungs are very close and when you lay down the acid can irritate your throat and lungs. This can cause hoarseness, cough, and wheezing. Please stop any alcohol or anti-inflammatories like aleve/advil/ibuprofen and start an over the counter Prilosec or omeprazole 1-2 times daily 60mins before food for 2 weeks, then switch to over the counter zantac/ratinidine or pepcid/famotadine once at night for 2 weeks.    sometimes irritation causes more irritation. Try voice rest, use sugar free cough drops to prevent coughing, and try to stop clearing your throat.   If you ever have a cough that does not go away after trying these things please make a follow up visit for further evaluation or we can refer you to a specialist. Or if you ever have shortness of breath or chest pain go to the ER.

## 2018-04-10 NOTE — Progress Notes (Signed)
Subjective:    Patient ID: [name], female    DOB: December 12, 1950, 67 y.o.   MRN: [id number]  HPI 67 y.o. WF presents with sore throat and hoarseness x 10 days.  Took claritin from nurse at the [facility] x 5 days but she was not better.   Blood pressure 128/82, pulse 74, temperature (!) 97.2 F (36.2 C), height 5\' 2"  (1.575 m), weight 224 lb (101.6 kg), SpO2 96 %.  Medications Current Outpatient Medications on File Prior to Visit  Medication Sig  . aspirin EC 81 MG tablet Take 81 mg by mouth daily.  . bisoprolol-hydrochlorothiazide (ZIAC) 10-6.25 MG tablet TAKE 1 TABLET BY MOUTH DAILY  . Cholecalciferol (VITAMIN D-3) 5000 UNITS TABS Take 2 tablets by mouth daily.   Marland Kitchen gabapentin (NEURONTIN) 300 MG capsule Take 1 capsule 3 x / day for sciatica pain (Patient taking differently: Take 1 capsule at night for sciatica pain)  . losartan (COZAAR) 50 MG tablet TAKE 1 TABLET(50 MG) BY MOUTH DAILY  . metFORMIN (GLUCOPHAGE-XR) 500 MG 24 hr tablet Take 2 tablet with Breakfast and 2 tablets w/Supper for Diabetes  . rosuvastatin (CRESTOR) 40 MG tablet Take 1/2 to 1 tablet daily or as directed for Cholesterol   No current facility-administered medications on file prior to visit.     Problem list She has Essential hypertension; Hemorrhoids; DJD; Stress-induced cardiomyopathy; T2_NIDDM w/Stage 2 CKD (GFR 76 ml/min); Hyperlipidemia; Vitamin D deficiency; Medication management; Osteoarthritis of left knee; Morbid obesity (BMI 39) ; Takotsubo syndrome; Acid reflux; and Right sided sciatica on their problem list.   Review of Systems  Constitutional: Negative for chills and diaphoresis.  HENT: Positive for congestion, postnasal drip, sinus pressure and sneezing. Negative for ear pain and sore throat.   Respiratory: Positive for cough. Negative for chest tightness, shortness of breath and wheezing.   Cardiovascular: Negative.   Gastrointestinal: Negative.   Genitourinary: Negative.   Musculoskeletal:  Negative for neck pain.  Neurological: Positive for headaches.       Objective:   Physical Exam  Constitutional: She appears well-developed and well-nourished.  HENT:  Head: Normocephalic and atraumatic.  Right Ear: External ear normal.  Nose: Right sinus exhibits maxillary sinus tenderness. Right sinus exhibits no frontal sinus tenderness. Left sinus exhibits maxillary sinus tenderness. Left sinus exhibits no frontal sinus tenderness.  Eyes: Conjunctivae and EOM are normal.  Neck: Normal range of motion. Neck supple.  Cardiovascular: Normal rate, regular rhythm, normal heart sounds and intact distal pulses.  Pulmonary/Chest: Effort normal and breath sounds normal. No respiratory distress. She has no wheezes.  Abdominal: Soft. Bowel sounds are normal.  Lymphadenopathy:    She has no cervical adenopathy.  Skin: Skin is warm and dry.        Assessment & Plan:   Acute non-recurrent maxillary sinusitis Will hold the zpak and take if she is not getting better, increase fluids, rest, cont allergy pill Monitor sugars -     predniSONE (DELTASONE) 20 MG tablet; 2 tablets daily for 3 days, 1 tablet daily for 4 days. -     azithromycin (ZITHROMAX) 250 MG tablet; Take 2 tablets (500 mg) on  Day 1,  followed by 1 tablet (250 mg) once daily on Days 2 through 5.  The patient was advised to call immediately if she has any concerning symptoms in the interval. The patient voices understanding of current treatment options and is in agreement with the current care plan.The patient knows to call the clinic with any  problems, questions or concerns or go to the ER if any further progression of symptoms.     Future Appointments  Date Time Provider Belle  06/14/2018 11:15 AM Vicie Mutters, PA-C [facility] None  09/27/2018  3:00 PM [name], MD [facility] None  12/19/2018  9:00 AM Liane Comber, NP [facility] None

## 2018-04-24 ENCOUNTER — Encounter: Payer: Self-pay | Admitting: Internal Medicine

## 2018-04-24 ENCOUNTER — Ambulatory Visit: Payer: Medicare Other | Admitting: Internal Medicine

## 2018-04-24 VITALS — BP 126/82 | HR 80 | Temp 97.3°F | Resp 18 | Ht 62.0 in | Wt 221.6 lb

## 2018-04-24 DIAGNOSIS — J0410 Acute tracheitis without obstruction: Secondary | ICD-10-CM | POA: Diagnosis not present

## 2018-04-24 DIAGNOSIS — J4541 Moderate persistent asthma with (acute) exacerbation: Secondary | ICD-10-CM | POA: Diagnosis not present

## 2018-04-24 MED ORDER — BENZONATATE 200 MG PO CAPS: ORAL_CAPSULE | ORAL | 1 refills | Status: DC

## 2018-04-24 MED ORDER — PREDNISONE 20 MG PO TABS: ORAL_TABLET | ORAL | 0 refills | Status: DC

## 2018-04-24 MED ORDER — PROMETHAZINE-DM 6.25-15 MG/5ML PO SYRP: ORAL_SOLUTION | ORAL | 1 refills | Status: DC

## 2018-04-24 NOTE — Patient Instructions (Signed)

## 2018-04-24 NOTE — Progress Notes (Signed)
  Subjective:    Patient ID: [name], female    DOB: [birth year], 67 y.o.   MRN: [id number]  HPI    Patient is a very nice 67 yo DWF who presents with several week hx/o of "allergy " drainage - taking Claritin and 2 weeks ago was rx'd a prednisone taper and also a Z-pak which she did not start til yesterday. She reports post nasal drainage, sore throat, hoarseness , intermittent fever & chills. Cough is minimally productive.   Medication Sig  . aspirin EC 81 MG tablet Take  daily.  . bisoprolol-hctz 10-6.25 MG tablet TAKE 1 TABLETDAILY  . VITAMIN D 5000 UNITS TABS Take 2 tablets by mouth daily.   Marland Kitchen gabapentin  300 MG capsule Take 1 capsule 3 x / day for sciatica pain (Patient taking differently: Take 1 capsule at night for sciatica pain)  . loratadine 10 MG tablet Take 10 mg by mouth daily. OTC  . losartan  50 MG tablet TAKE 1 TABLET(50 MG) BY MOUTH DAILY  . metFORMIn-XR 500 MG Take 2 tablet with Breakfast and 2 tablets w/Supper for Diabetes  . rosuvastatin 40 MG tablet Take 1/2 to 1 tablet daily or as directed for Cholesterol   No Known Allergies   Past Medical History:  Diagnosis Date  . Arthritis   . CAD (coronary artery disease)   . Coronary disease    nonobstructive, history of  . Diabetes mellitus without complication ([place])   . Gastric reflux    history of  . Hemorrhoids   . HTN (hypertension)   . Hypercholesterolemia   . Myocardial infarction ([place]) 2004   slight mi x 1   Review of Systems   10 point systems review negative except as above.    Objective:   Physical Exam  BP 126/82   Pulse 80   Temp (!) 97.3 F (36.3 C)   Resp 18   Ht 5\' 2"  (1.575 m)   Wt 221 lb 9.6 oz (100.5 kg)   BMI 40.53 kg/m   Hoarse raspy voice. Wheezy cough .   O2 sat 98-99%  HEENT - WNL.Tm sl retracted. N/O/P 1-2 + injected, no exudates. Neck - supple.  Chest - Bilat inspir fine/med coarse rales and  med expiratory rhonchi & wheezes.  Cor - Nl HS. RRR w/o sig m. PP 1(+). No  edema. MS- FROM w/o deformities.  Gait Nl. Neuro -  Nl w/o focal abnormalities.    Assessment & Plan:   1. Moderate persistent asthmatic bronchitis with acute exacerbation  - predniSONE (DELTASONE) 20 MG tablet; Take 2 tablets immediately, then 1 tab 3 x day - 3 days, then 1 tab 2 x day - 3 days, then 1 tab daily  Dispense: 22 tablet; Refill: 0  - Sx Trelegy x 2 week supply and instructed in use.   2. Tracheitis  - benzonatate (TESSALON) 200 MG capsule; Take 1 perle 3 x / day to prevent cough  Dispense: 30 capsule; Refill: 1  - promethazine-dextromethorphan (PROMETHAZINE-DM) 6.25-15 MG/5ML syrup; Take 1 to 2 tsp enery 4 hours if needed for cough  Dispense: 360 mL; Refill: 1

## 2018-06-13 NOTE — Progress Notes (Signed)
FOLLOW UP  Assessment and Plan:   Post menopausal bleeding On exam had abnormal thin erythematous finger like projection from the cervix  Will proceed with [place] and referral to gYN rule out CA PAP sent today   Hypertension  Continue medication: bisoprolol-hctz 10-6.25 mg, losartan 50 mg  Monitor blood pressure at home.  Continue DASH diet.    Reminder to go to the ER if any CP, SOB, nausea, dizziness, severe HA, changes vision/speech, left arm numbness and tingling and jaw pain.  Cholesterol  Continue medications: pravastatin 40 mg  Continue diet and exercise.   Check lipid panel.    Prediabetes with diabetic chronic kidney disease  Continue medications: metformin ER 500 mg x4 tabs, invokana 300 mg   Continue diet and exercise.   Perform daily foot/skin check, notify office of any concerning changes.   Check A1C  Vitamin D Def  Continue medications: cholecalciferol 10000 IU daily  Check Vit D level   Continue diet and meds as discussed. Further disposition pending results of labs. Discussed med's effects and SE's.   Over 30 minutes of exam, counseling, chart review, and critical decision making was performed.   Future Appointments  Date Time Provider [place]  09/27/2018  3:00 PM [name], MD [facility] None  12/19/2018  9:00 AM Liane Comber, NP [facility] None    ----------------------------------------------------------------------------------------------------------------------  HPI 67 y.o. female  presents for 3 month follow up on hypertension, cholesterol, diabetes and vitamin D deficiency.   Has not had menses since 1999, last few months has had spotting for several days after wiping, no history of hemorrhoids. No cramping or pain with it, no vaginal discharge, no fever, chills.   Her blood pressure has been controlled at home, today their BP is BP: 124/82  She does not workout. She denies chest pain, shortness of breath,  dizziness.   She is on cholesterol medication, she is on 1/2 of crestor at night and denies myalgias. Her cholesterol is at goal. The cholesterol last visit was:   Lab Results  Component Value Date   CHOL 146 03/14/2018   HDL 52 03/14/2018   LDLCALC 67 03/14/2018   TRIG 208 (H) 03/14/2018   CHOLHDL 2.8 03/14/2018    She has been working on diet and exercise for prediabetes, and denies foot ulcerations, hyperglycemia, hypoglycemia , nausea and paresthesia of the feet. Last A1C in the office was near goal:  Lab Results  Component Value Date   HGBA1C 5.9 (H) 03/14/2018   Patient is on Vitamin D supplement and at goal.   Lab Results  Component Value Date   VD25OH 93 03/14/2018     BMI is Body mass index is 40.71 kg/m., she is working on diet and exercise. Wt Readings from Last 3 Encounters:  06/14/18 222 lb 9.6 oz (101 kg)  04/24/18 221 lb 9.6 oz (100.5 kg)  04/10/18 224 lb (101.6 kg)   She is requesting a PAP today, has not had for a long time.    Current Medications:  Current Outpatient Medications on File Prior to Visit  Medication Sig  . aspirin EC 81 MG tablet Take 81 mg by mouth daily.  . bisoprolol-hydrochlorothiazide (ZIAC) 10-6.25 MG tablet TAKE 1 TABLET BY MOUTH DAILY  . Cholecalciferol (VITAMIN D-3) 5000 UNITS TABS Take 2 tablets by mouth daily.   Marland Kitchen gabapentin (NEURONTIN) 300 MG capsule Take 1 capsule 3 x / day for sciatica pain (Patient taking differently: Take 1 capsule at night for sciatica pain)  .  losartan (COZAAR) 50 MG tablet TAKE 1 TABLET(50 MG) BY MOUTH DAILY  . metFORMIN (GLUCOPHAGE-XR) 500 MG 24 hr tablet Take 2 tablet with Breakfast and 2 tablets w/Supper for Diabetes  . rosuvastatin (CRESTOR) 40 MG tablet Take 1/2 to 1 tablet daily or as directed for Cholesterol   No current facility-administered medications on file prior to visit.      Allergies: No Known Allergies   Medical History:  Past Medical History:  Diagnosis Date  . Arthritis   . CAD  (coronary artery disease)   . Coronary disease    nonobstructive, history of  . Diabetes mellitus without complication ([place])   . Gastric reflux    history of  . Hemorrhoids   . HTN (hypertension)   . Hypercholesterolemia   . Myocardial infarction ([place]) 2004   slight mi x 1   Family history- Reviewed and unchanged Social history- Reviewed and unchanged  ROS- see HPI  Physical Exam: BP 124/82   Pulse 86   Temp (!) 97.5 F (36.4 C)   Resp 16   Ht 5\' 2"  (1.575 m)   Wt 222 lb 9.6 oz (101 kg)   SpO2 96%   BMI 40.71 kg/m  Wt Readings from Last 3 Encounters:  06/14/18 222 lb 9.6 oz (101 kg)  04/24/18 221 lb 9.6 oz (100.5 kg)  04/10/18 224 lb (101.6 kg)   General Appearance: Well nourished,  Obese, in no apparent distress. Eyes: PERRLA, EOMs, conjunctiva no swelling or erythema Sinuses: No Frontal/maxillary tenderness ENT/Mouth: Ext aud canals clear, TMs without erythema, bulging. No erythema, swelling, or exudate on post pharynx.  Tonsils not swollen or erythematous. Hearing normal.  Neck: Supple, thyroid normal.  Respiratory: Respiratory effort normal, BS equal bilaterally without rales, rhonchi, wheezing or stridor.  Cardio: RRR with no MRGs. Brisk peripheral pulses without edema.  Abdomen: Soft, + BS.  Non tender, no guarding, rebound, hernias, masses. Lymphatics: Non tender without lymphadenopathy.  Musculoskeletal: Full ROM, 5/5 strength, Normal gait GYN: external vaginal canal with erythema, thin non odorous clear discharge, had abnormal thin erythematous finger like projection from the cervix with minimal blood, PAP done Skin: Warm, dry without rashes, lesions, ecchymosis.  Neuro: Cranial nerves intact. No cerebellar symptoms.  Psych: Awake and oriented X 3, normal affect, Insight and Judgment appropriate.    Vicie Mutters, PA-C 11:34 AM [facility]

## 2018-06-14 ENCOUNTER — Encounter: Payer: Self-pay | Admitting: Physician Assistant

## 2018-06-14 ENCOUNTER — Other Ambulatory Visit (HOSPITAL_COMMUNITY)
Admission: RE | Admit: 2018-06-14 | Discharge: 2018-06-14 | Disposition: A | Discharge status: Home / Self Care | Payer: Medicare Other | Source: Ambulatory Visit | Attending: Physician Assistant | Admitting: Physician Assistant

## 2018-06-14 ENCOUNTER — Ambulatory Visit: Payer: Medicare Other | Admitting: Physician Assistant

## 2018-06-14 VITALS — BP 124/82 | HR 86 | Temp 97.5°F | Resp 16 | Ht 62.0 in | Wt 222.6 lb

## 2018-06-14 DIAGNOSIS — N182 Chronic kidney disease, stage 2 (mild): Secondary | ICD-10-CM

## 2018-06-14 DIAGNOSIS — Z124 Encounter for screening for malignant neoplasm of cervix: Secondary | ICD-10-CM | POA: Diagnosis present

## 2018-06-14 DIAGNOSIS — I10 Essential (primary) hypertension: Secondary | ICD-10-CM | POA: Diagnosis not present

## 2018-06-14 DIAGNOSIS — N950 Postmenopausal bleeding: Secondary | ICD-10-CM

## 2018-06-14 DIAGNOSIS — E1122 Type 2 diabetes mellitus with diabetic chronic kidney disease: Secondary | ICD-10-CM

## 2018-06-14 DIAGNOSIS — E782 Mixed hyperlipidemia: Secondary | ICD-10-CM | POA: Diagnosis not present

## 2018-06-14 DIAGNOSIS — Z79899 Other long term (current) drug therapy: Secondary | ICD-10-CM

## 2018-06-14 DIAGNOSIS — Z6841 Body Mass Index (BMI) 40.0 and over, adult: Secondary | ICD-10-CM

## 2018-06-14 DIAGNOSIS — I5181 Takotsubo syndrome: Secondary | ICD-10-CM

## 2018-06-14 NOTE — Addendum Note (Signed)
Addended by: Vicie Mutters R on: 06/14/2018 12:02 PM   Modules accepted: Orders

## 2018-06-14 NOTE — Patient Instructions (Signed)
YOU CAN CALL TO MAKE AN ULTRASOUND..  I have put in an order for an ultrasound for you to have You can set them up at your convenience by calling this number [phone] You will likely have the ultrasound at [place] [address]  If you have any issues call our office and we will set this up for you.   Postmenopausal Bleeding Postmenopausal bleeding is any bleeding a woman has after she has entered into menopause. Menopause is the end of a woman's fertile years. After menopause, a woman no longer ovulates or has menstrual periods. Postmenopausal bleeding can be caused by various things. Any type of postmenopausal bleeding, even if it appears to be a typical menstrual period, is concerning. This should be evaluated by your health care provider. Any treatment will depend on the cause of the bleeding. Follow these instructions at home: Monitor your condition for any changes. The following actions may help to alleviate any discomfort you are experiencing:  Avoid the use of tampons and douches as directed by your health care provider.  Change your pads frequently.  Get regular pelvic exams and Pap tests.  Keep all follow-up appointments for diagnostic tests as directed by your health care provider.  Contact a health care provider if:  Your bleeding lasts more than 1 week.  You have abdominal pain.  You have bleeding with sexual intercourse. Get help right away if:  You have a fever, chills, headache, dizziness, muscle aches, and bleeding.  You have severe pain with bleeding.  You are passing blood clots.  You have bleeding and need more than 1 pad an hour.  You feel faint. This information is not intended to replace advice given to you by your health care provider. Make sure you discuss any questions you have with your health care provider. Document Released: 11/09/2005 Document Revised: 01/07/2016 Document Reviewed: 02/28/2013 Elsevier Interactive Patient Education   [employer].

## 2018-06-15 LAB — COMPLETE METABOLIC PANEL WITH GFR
AG Ratio: 1.8 (calc) (ref 1.0–2.5)
ALBUMIN MSPROF: 4.4 g/dL (ref 3.6–5.1)
ALT: 23 U/L (ref 6–29)
AST: 19 U/L (ref 10–35)
Alkaline phosphatase (APISO): 59 U/L (ref 33–130)
BUN: 12 mg/dL (ref 7–25)
CALCIUM: 9.6 mg/dL (ref 8.6–10.4)
CO2: 24 mmol/L (ref 20–32)
Chloride: 104 mmol/L (ref 98–110)
Creat: 0.74 mg/dL (ref 0.50–0.99)
GFR, EST NON AFRICAN AMERICAN: 83 mL/min/{1.73_m2} (ref >=60)
GFR, Est African American: 96 mL/min/{1.73_m2} (ref >=60)
GLOBULIN: 2.4 g/dL (ref 1.9–3.7)
Glucose, Bld: 144 mg/dL — ABNORMAL HIGH (ref 65–99)
Potassium: 4.1 mmol/L (ref 3.5–5.3)
SODIUM: 141 mmol/L (ref 135–146)
Total Bilirubin: 0.9 mg/dL (ref 0.2–1.2)
Total Protein: 6.8 g/dL (ref 6.1–8.1)

## 2018-06-15 LAB — LIPID PANEL
CHOL/HDL RATIO: 2.9 (calc) (ref <5.0)
Cholesterol: 126 mg/dL (ref <200)
HDL: 44 mg/dL — AB (ref >50)
LDL Cholesterol (Calc): 49 mg/dL (calc)
NON-HDL CHOLESTEROL (CALC): 82 mg/dL (ref <130)
Triglycerides: 277 mg/dL — ABNORMAL HIGH (ref <150)

## 2018-06-15 LAB — CBC WITH DIFFERENTIAL/PLATELET
Basophils Absolute: 69 cells/uL (ref 0–200)
Basophils Relative: 1 %
EOS PCT: 2.9 %
Eosinophils Absolute: 200 cells/uL (ref 15–500)
HEMATOCRIT: 37.5 % (ref 35.0–45.0)
Hemoglobin: 12.5 g/dL (ref 11.7–15.5)
LYMPHS ABS: 1808 {cells}/uL (ref 850–3900)
MCH: 29 pg (ref 27.0–33.0)
MCHC: 33.3 g/dL (ref 32.0–36.0)
MCV: 87 fL (ref 80.0–100.0)
MPV: 12.1 fL (ref 7.5–12.5)
Monocytes Relative: 6.7 %
NEUTROS ABS: 4361 {cells}/uL (ref 1500–7800)
Neutrophils Relative %: 63.2 %
Platelets: 236 10*3/uL (ref 140–400)
RBC: 4.31 10*6/uL (ref 3.80–5.10)
RDW: 13.5 % (ref 11.0–15.0)
Total Lymphocyte: 26.2 %
WBC mixed population: 462 cells/uL (ref 200–950)
WBC: 6.9 10*3/uL (ref 3.8–10.8)

## 2018-06-15 LAB — HEMOGLOBIN A1C
EAG (MMOL/L): 7.3 (calc)
Hgb A1c MFr Bld: 6.2 % of total Hgb — ABNORMAL HIGH (ref <5.7)
Mean Plasma Glucose: 131 (calc)

## 2018-06-15 LAB — TSH: TSH: 3.45 m[IU]/L (ref 0.40–4.50)

## 2018-06-18 ENCOUNTER — Ambulatory Visit
Admission: RE | Admit: 2018-06-18 | Discharge: 2018-06-18 | Disposition: A | Discharge status: Home / Self Care | Payer: BLUE CROSS/BLUE SHIELD | Source: Ambulatory Visit | Attending: Physician Assistant | Admitting: Physician Assistant

## 2018-06-18 DIAGNOSIS — N950 Postmenopausal bleeding: Secondary | ICD-10-CM

## 2018-06-21 LAB — CYTOLOGY - PAP: HPV (WINDOPATH): NOT DETECTED

## 2018-06-28 ENCOUNTER — Encounter: Payer: Self-pay | Admitting: Gynecology

## 2018-06-28 ENCOUNTER — Ambulatory Visit: Payer: Medicare Other | Admitting: Gynecology

## 2018-06-28 VITALS — BP 130/78 | Ht 62.0 in | Wt 221.0 lb

## 2018-06-28 DIAGNOSIS — N840 Polyp of corpus uteri: Secondary | ICD-10-CM

## 2018-06-28 DIAGNOSIS — N950 Postmenopausal bleeding: Secondary | ICD-10-CM

## 2018-06-28 DIAGNOSIS — Z124 Encounter for screening for malignant neoplasm of cervix: Secondary | ICD-10-CM | POA: Diagnosis not present

## 2018-06-28 NOTE — Addendum Note (Signed)
Addended by: Nelva Nay on: 06/28/2018 01:07 PM   Modules accepted: Orders

## 2018-06-28 NOTE — Progress Notes (Signed)
    KARL KNARR 1951-08-11 [id number]        67 y.o.  G3P3 new patient presents with a history of postmenopausal bleeding.  Her first episode was July 2019 and she has had several episodes of spotting since.  Not associated with pain or other symptoms.  Had ultrasound through her primary physician's office that showed uterus grossly normal size and echotexture.  Endometrial echo 9.3 mm.  Right ovary was normal.  Left ovary was not visualized although no adnexal pathology noted.  Trace free fluid in the pelvis noted.  Pap smear was done returned unsatisfactory due to lack of cellularity.  Past medical history,surgical history, problem list, medications, allergies, family history and social history were all reviewed and documented in the EPIC chart.  Directed ROS with pertinent positives and negatives documented in the history of present illness/assessment and plan.  Exam: [name] assistant Vitals:   06/28/18 1206  BP: 130/78  Weight: 221 lb (100.2 kg)  Height: 5\' 2"  (1.575 m)   General appearance:  Normal Abdomen soft nontender without masses guarding rebound Pelvic external BUS vagina with atrophic changes.  Cervix with atrophic changes.  Pap smear done.  Long fingerlike fleshy polypoid growth extruding from external os.  Uterus difficult to palpate but no gross masses or tenderness.  Rectal exam is normal.  Procedure: The polypoid growth was grasped with a ring forcep and with gentle traction a larger portion was extracted from the cervix and ultimately excised.  Scant bleeding afterwards.  Specimen sent to pathology.  Patient tolerated well.  Assessment/Plan:  67 y.o. G3P3 with postmenopausal bleeding and exam showing fleshy polypoid growth from the cervix, suspicious for endometrial polyp.  Discussed with patient the need to evaluate for any residual polyps within the cavity and my recommendations to proceed with sonohysterogram.  Possible need for hysteroscopy D&C also discussed.   She will follow-up for the pathology results from today's specimen and schedule a sonohysterogram and then will go from there.  I repeated her Pap smear today.    Anastasio Auerbach MD, 12:58 PM 06/28/2018

## 2018-06-28 NOTE — Patient Instructions (Signed)
Office will call with biopsy results  Follow up for the ultrasound as scheduled

## 2018-07-02 LAB — TISSUE SPECIMEN

## 2018-07-02 LAB — PATHOLOGY

## 2018-07-03 LAB — PAP IG W/ RFLX HPV ASCU

## 2018-07-14 ENCOUNTER — Other Ambulatory Visit: Payer: Self-pay | Admitting: Internal Medicine

## 2018-07-23 ENCOUNTER — Other Ambulatory Visit: Payer: Self-pay | Admitting: Gynecology

## 2018-07-23 DIAGNOSIS — N950 Postmenopausal bleeding: Secondary | ICD-10-CM

## 2018-08-02 ENCOUNTER — Ambulatory Visit: Payer: Medicare Other | Admitting: Gynecology

## 2018-08-02 ENCOUNTER — Encounter: Payer: Self-pay | Admitting: Gynecology

## 2018-08-02 ENCOUNTER — Ambulatory Visit (INDEPENDENT_AMBULATORY_CARE_PROVIDER_SITE_OTHER): Payer: Medicare Other

## 2018-08-02 VITALS — BP 124/76

## 2018-08-02 DIAGNOSIS — N950 Postmenopausal bleeding: Secondary | ICD-10-CM

## 2018-08-02 DIAGNOSIS — N840 Polyp of corpus uteri: Secondary | ICD-10-CM | POA: Diagnosis not present

## 2018-08-02 NOTE — Patient Instructions (Signed)
Office will call with the biopsy results from the ultrasound as well as to schedule the surgery.

## 2018-08-02 NOTE — Progress Notes (Signed)
    [name] [birth year] [id number]        67 y.o.  G3P3 presents for sonohysterogram.  History of postmenopausal bleeding where exam showed polypoid mass from the cervix which ultimately proved to be an endometrial polyp.  She follows up now for endometrial assessment.  Past medical history,surgical history, problem list, medications, allergies, family history and social history were all reviewed and documented in the EPIC chart.  Directed ROS with pertinent positives and negatives documented in the history of present illness/assessment and plan.  Exam: [name] assistant Vitals:   08/02/18 1217  BP: 124/76   General appearance:  Normal Abdomen soft nontender without masses guarding rebound Pelvic external BUS vagina with atrophic changes.  Cervix with atrophic changes.  Uterus unable to palpate but no gross masses or tenderness.  Adnexa without gross masses or tenderness.  Ultrasound transvaginal shows uterus normal size and echotexture.  Endometrial echo 21.8 mm.  Solid and cystic changes noted within the endometrium.  Right and left ovaries visualized and normal.  Cul-de-sac negative.  Sonohysterogram performed, sterile technique, easy catheter introduction with poor visualization of the endometrium despite transvaginal and transabdominal scanning.  Ill-defined focus measuring 37 x 27 mm noted.  Endometrial sample taken with scant return.  Assessment/Plan:  67 y.o. G3P3 with history of postmenopausal bleeding.  Initial exam showed endometrial polyp extruding from the external cervical os.  Ultrasound today shows markedly thickened endometrium with solid cystic changes consistent with large polyp.  Sonohysterogram was of poor quality but suggested endometrial defect.  Endometrial sample taken with scant return.  Discussed situation with patient and my recommendation to proceed with hysteroscopy D&C.  The patient agrees with the plan and will move towards scheduling the surgery.  She  will follow-up for a preoperative appointment before hand as well as the biopsy results from the ultrasound today.    Anastasio Auerbach MD, 12:34 PM 08/02/2018

## 2018-08-04 LAB — HM MAMMOGRAPHY

## 2018-08-06 LAB — HM DIABETES EYE EXAM

## 2018-08-14 ENCOUNTER — Telehealth: Payer: Self-pay

## 2018-08-14 MED ORDER — MISOPROSTOL 200 MCG PO TABS: ORAL_TABLET | ORAL | 0 refills | Status: DC

## 2018-08-14 NOTE — Telephone Encounter (Signed)
Spoke with patient and scheduled surgery for 10/05/18 at [facility] at 7:30am.  Pamphlet mailed.  I advised her that I cannot check ins benefits or advise regarding prepayment until after 08/15/18 as her ins company advised.  I discussed with her need for Cytotec tab vaginally hs prior to surgery.  Rx sent. I did warn her she might see some spotting/vag bleeding/cramping night before surgery.

## 2018-08-16 ENCOUNTER — Other Ambulatory Visit: Payer: Self-pay | Admitting: Internal Medicine

## 2018-08-20 ENCOUNTER — Encounter: Payer: Self-pay | Admitting: *Deleted

## 2018-08-23 ENCOUNTER — Telehealth: Payer: Self-pay

## 2018-08-23 NOTE — Telephone Encounter (Signed)
I called patient and let her know I called and checked benefits on her [facility] Medicare plan. It pays physician fees at 100% and she will have a $250 co-payment to the [facility].  Left my direct phone number in case she has questions.

## 2018-08-24 ENCOUNTER — Telehealth: Payer: Self-pay

## 2018-08-24 NOTE — Telephone Encounter (Signed)
I received the following staff message from Dr. Loetta Rough:  "Ask the patient about the listed complications with anesthesia family history and personal history.  Have primary MD clearance for surgery."  I spoke with patient. She said she has never had any problem with anesthesia and has had multiple surgeries. No know history of anesthesia problems in her family.  She already has appt with Dr. Melford Aase on 09/27/18 and will ask him about medical clearance.

## 2018-08-28 NOTE — Telephone Encounter (Signed)
Dr. Loetta Rough- Have you seen this so I can close it?

## 2018-08-29 NOTE — Telephone Encounter (Signed)
Okay to close

## 2018-08-31 ENCOUNTER — Encounter: Payer: Self-pay | Admitting: Internal Medicine

## 2018-09-01 ENCOUNTER — Other Ambulatory Visit: Payer: Self-pay | Admitting: Internal Medicine

## 2018-09-01 DIAGNOSIS — E782 Mixed hyperlipidemia: Secondary | ICD-10-CM

## 2018-09-11 ENCOUNTER — Telehealth: Payer: Self-pay

## 2018-09-11 NOTE — Telephone Encounter (Signed)
Patient said Dr. Melford Aase would not provide medical clearance for surgery unless our office requested it. I sent a medical clearance request form via fax and received confirmation of receipt. I left patient know I did this and we will wait to receive clearance.

## 2018-09-18 ENCOUNTER — Encounter: Payer: Self-pay | Admitting: Gynecology

## 2018-09-26 ENCOUNTER — Encounter: Payer: Self-pay | Admitting: Gynecology

## 2018-09-26 ENCOUNTER — Ambulatory Visit: Payer: Medicare Other | Admitting: Gynecology

## 2018-09-26 VITALS — BP 130/86

## 2018-09-26 DIAGNOSIS — N840 Polyp of corpus uteri: Secondary | ICD-10-CM | POA: Diagnosis not present

## 2018-09-26 DIAGNOSIS — N950 Postmenopausal bleeding: Secondary | ICD-10-CM

## 2018-09-26 NOTE — H&P (Signed)
[name] 1950-08-30 [id number]   History and Physical  Chief complaint: Postmenopausal bleeding.  Endometrial polyp  History of present illness: 68 y.o. G3P3 with a history of postmenopausal bleeding.  Exam initially showed a polypoid mass from the cervix which was removed and was an endometrial polyp.  Follow-up sonohysterogram showed endometrial echo measuring 21.8 mm.  A solid/cystic change was noted within the endometrium.  Sonohysterogram was performed with poor visualization of the endometrium but showed a ill-defined focus measuring 37 x 27 mm.  Endometrial sample showed endocervical tissue but no endometrial tissue.  Patient is admitted for hysteroscopy D&C with resection of any residual endometrial polyps.  Past Medical History:  Diagnosis Date  . Arthritis   . CAD (coronary artery disease)   . Coronary disease    nonobstructive, history of  . Diabetes mellitus without complication ([place])   . Gastric reflux    history of  . Hemorrhoids   . HTN (hypertension)   . Hypercholesterolemia   . Myocardial infarction ([place]) 2004   slight mi x 1    Past Surgical History:  Procedure Laterality Date  . ANGIOPLASTY    . CARDIAC CATHETERIZATION  November 25 2004   left heart, minor coronary atherosclerosis  . CESAREAN SECTION     x 3  . CHOLECYSTECTOMY  36 yrs ago  . EYE SURGERY Bilateral dec 2010    lens replacements  . KNEE ARTHROSCOPY WITH MEDIAL MENISECTOMY Left 02/25/2014   Procedure: LEFT KNEE ARTHROSCOPY WITH MEDIAL AND LATERAL MENISECTOMY, MICROFRACTURE, SYNOVECTOMY SUPRA PATELLA;  Surgeon: Tobi Bastos, MD;  Location: [facility];  Service: Orthopedics;  Laterality: Left;    Family History  Problem Relation Age of Onset  . Hypertension Mother   . COPD Mother   . Thyroid disease Mother   . Heart attack Father   . Heart disease Father   . Thyroid disease Sister   . Arthritis Sister        Rheumatoid  . Hypertension Sister   . Graves' disease Sister   . Colon cancer  Maternal Grandfather     Social History:  reports that she has never smoked. She has never used smokeless tobacco. She reports current alcohol use. She reports that she does not use drugs.  Allergies: No Known Allergies  Medications: See Epic for the most current list of medications.  ROS:  Was performed and pertinent positives and negatives are included in the history of present illness.  Exam:  [name] assistant Vitals:   09/26/18 1422  BP: 130/86   General appearance:  Normal HEENT normal Lungs clear Cardiac regular rate no rubs murmurs or gallops Abdomen soft nontender without masses guarding rebound Pelvic external BUS vagina with atrophic changes.  Cervix with atrophic changes.  Uterus difficult to palpate but no gross masses or tenderness.    Assessment/Plan:  68 y.o. G3P3 with postmenopausal bleeding.  Initial exam showing endometrial polyp extruding from the cervix.  Follow-up sonohysterogram showed thickened endometrial echo with solid/cystic changes.  Endometrial sample showed no endometrial tissue.  For hysteroscopy D&C.  I reviewed the proposed surgery with the patient to include the expected intraoperative and postoperative courses as well as the recovery period. The use of the hysteroscope, resectoscope and the D&C portion were all discussed. The risks of surgery to include infection, prolonged antibiotics, hemorrhage necessitating transfusion and the risks of transfusion were all discussed understood and accepted. The risk of damage to internal organs during the procedure, either immediately recognized or delay  recognized, including vagina, cervix, uterus, possible perforation causing damage to bowel, bladder, ureters, vessels and nerves necessitating major exploratory reparative surgery and future reparative surgeries including bladder repair, ureteral damage repair, bowel resection, ostomy formation was also discussed understood and accepted. The patient's questions  were answered to her satisfaction and she is ready to proceed with surgery.    Anastasio Auerbach MD, 2:43 PM 09/26/2018

## 2018-09-26 NOTE — Patient Instructions (Signed)
Followup for surgery as scheduled. 

## 2018-09-26 NOTE — Progress Notes (Signed)
    [name] 02-Apr-1951 [id number]        67 y.o.  G3P3 presents for her preoperative consult for upcoming hysteroscopy D&C.  She has a history of postmenopausal bleeding.  Exam at that point showed a polypoid mass from the cervix which was removed and was an endometrial polyp.  Follow-up sonohysterogram showed endometrial echo measuring 21.8 mm.  A solid/cystic change was noted within the endometrium.  Sonohysterogram was performed with poor visualization of the endometrium but showed a ill-defined focus measuring 37 x 27 mm.  Endometrial sample showed endocervical tissue but no endometrial tissue.  Past medical history,surgical history, problem list, medications, allergies, family history and social history were all reviewed and documented in the EPIC chart.  Directed ROS with pertinent positives and negatives documented in the history of present illness/assessment and plan.  Exam: [name] assistant Vitals:   09/26/18 1422  BP: 130/86   General appearance:  Normal HEENT normal Lungs clear Cardiac regular rate no rubs murmurs or gallops Abdomen soft nontender without masses guarding rebound Pelvic external BUS vagina with atrophic changes.  Cervix with atrophic changes.  Uterus difficult to palpate but no gross masses or tenderness.  Assessment/Plan:  68 y.o. G3P3 with postmenopausal bleeding.  Initial exam showing endometrial polyp extruding from the cervix.  Follow-up sonohysterogram showed thickened endometrial echo with solid/cystic changes.  Endometrial sample showed no endometrial tissue.  For hysteroscopy D&C.  I reviewed the proposed surgery with the patient to include the expected intraoperative and postoperative courses as well as the recovery period. The use of the hysteroscope, resectoscope and the D&C portion were all discussed. The risks of surgery to include infection, prolonged antibiotics, hemorrhage necessitating transfusion and the risks of transfusion were all  discussed understood and accepted. The risk of damage to internal organs during the procedure, either immediately recognized or delay recognized, including vagina, cervix, uterus, possible perforation causing damage to bowel, bladder, ureters, vessels and nerves necessitating major exploratory reparative surgery and future reparative surgeries including bladder repair, ureteral damage repair, bowel resection, ostomy formation was also discussed understood and accepted. The patient's questions were answered to her satisfaction and she is ready to proceed with surgery.   Anastasio Auerbach MD, 2:40 PM 09/26/2018

## 2018-09-27 ENCOUNTER — Ambulatory Visit: Payer: Medicare Other | Admitting: Gynecology

## 2018-09-27 ENCOUNTER — Encounter: Payer: Self-pay | Admitting: Internal Medicine

## 2018-09-27 ENCOUNTER — Ambulatory Visit: Payer: Medicare Other | Admitting: Internal Medicine

## 2018-09-27 VITALS — BP 113/68 | HR 60 | Temp 97.5°F | Ht 62.0 in | Wt 226.0 lb

## 2018-09-27 DIAGNOSIS — Z1212 Encounter for screening for malignant neoplasm of rectum: Secondary | ICD-10-CM

## 2018-09-27 DIAGNOSIS — Z136 Encounter for screening for cardiovascular disorders: Secondary | ICD-10-CM

## 2018-09-27 DIAGNOSIS — K219 Gastro-esophageal reflux disease without esophagitis: Secondary | ICD-10-CM

## 2018-09-27 DIAGNOSIS — Z1211 Encounter for screening for malignant neoplasm of colon: Secondary | ICD-10-CM

## 2018-09-27 DIAGNOSIS — I10 Essential (primary) hypertension: Secondary | ICD-10-CM | POA: Diagnosis not present

## 2018-09-27 DIAGNOSIS — Z0000 Encounter for general adult medical examination without abnormal findings: Secondary | ICD-10-CM

## 2018-09-27 DIAGNOSIS — N182 Chronic kidney disease, stage 2 (mild): Secondary | ICD-10-CM

## 2018-09-27 DIAGNOSIS — Z79899 Other long term (current) drug therapy: Secondary | ICD-10-CM

## 2018-09-27 DIAGNOSIS — E1122 Type 2 diabetes mellitus with diabetic chronic kidney disease: Secondary | ICD-10-CM

## 2018-09-27 DIAGNOSIS — E559 Vitamin D deficiency, unspecified: Secondary | ICD-10-CM

## 2018-09-27 DIAGNOSIS — Z0001 Encounter for general adult medical examination with abnormal findings: Secondary | ICD-10-CM

## 2018-09-27 DIAGNOSIS — Z8249 Family history of ischemic heart disease and other diseases of the circulatory system: Secondary | ICD-10-CM

## 2018-09-27 DIAGNOSIS — E782 Mixed hyperlipidemia: Secondary | ICD-10-CM

## 2018-09-27 NOTE — Progress Notes (Signed)
[facility] [name], M.D.     Uvaldo Bristle. Silverio Lay, P.A.-C Liane Comber, [place] [address] [place], [place] [address] Telephone [phone] Telefax [phone] Annual Screening/Preventative Visit & Comprehensive Evaluation &  Examination     This very nice 68 y.o. DWF  presents for a Screening /Preventative Visit & comprehensive evaluation and management of multiple medical co-morbidities.  Patient has been followed for HTN, HLD, T2_NIDDM  and Vitamin D Deficiency. Patient also has GERD controlled with her meds.       HTN predates circa 1994.  In 2004, she was dx'd with a Viral Myocarditis & in 2006 had a normal Heart Cath and in 2009 had a normal Myoview. Patient's BP has been controlled at home and patient denies any cardiac symptoms as chest pain, palpitations, shortness of breath, dizziness or ankle swelling. Today's BP is at goal - 113/68.      Patient's hyperlipidemia is controlled with diet and medications. Patient denies myalgias or other medication SE's. Last lipids were at goal albeit elevated Trig's: Lab Results  Component Value Date   CHOL 126 09/27/2018   HDL 51 09/27/2018   LDLCALC 55 09/27/2018   TRIG 117 09/27/2018   CHOLHDL 2.5 09/27/2018      Patient has Morbid Obesity (BMI 41+) and hx/o T2_NIDDM/CKD2 (2007) and patient is on Metformin & denies reactive hypoglycemic symptoms, visual blurring, diabetic polys or paresthesias. Last A1c was not at goal: Lab Results  Component Value Date   HGBA1C 6.1 (H) 09/27/2018      Finally, patient has history of Vitamin D Deficiency ("20" / 2008)  and last Vitamin D was at goal: Lab Results  Component Value Date   VD25OH 96 09/27/2018   Current Outpatient Medications on File Prior to Visit  Medication Sig  . aspirin EC 81 MG tablet Take 81 mg by mouth daily.  . bisoprolol-hydrochlorothiazide (ZIAC) 10-6.25 MG tablet TAKE 1 TABLET BY MOUTH  DAILY  . Cholecalciferol (VITAMIN D-3) 5000 UNITS TABS Take 2 tablets by mouth daily.   Marland Kitchen gabapentin (NEURONTIN) 300 MG capsule Take 1 capsule 3 x / day for sciatica pain (Patient taking differently: Take 1 capsule at night for sciatica pain)  . losartan (COZAAR) 50 MG tablet TAKE 1 TABLET(50 MG) BY MOUTH DAILY  . metFORMIN (GLUCOPHAGE-XR) 500 MG 24 hr tablet TAKE 1 TABLET BY MOUTH WITH BREAKFAST, 1 TABLET WITH LUNCH AND 2 TABLETS WITH SUPPER DAILY  . misoprostol (CYTOTEC) 200 MCG tablet Insert tab intravaginally hs before surgery.  . rosuvastatin (CRESTOR) 40 MG tablet Take 1 tablet daily for Cholesterol (Patient taking differently: Take 1/2 tablet daily for Cholesterol)   No current facility-administered medications on file prior to visit.    No Known Allergies Past Medical History:  Diagnosis Date  . Arthritis   . CAD (coronary artery disease)   . Coronary disease    nonobstructive, history of  . Diabetes mellitus without complication ([place])   . Gastric reflux    history of  . Hemorrhoids   . HTN (hypertension)   . Hypercholesterolemia   . Myocardial infarction ([place]) 2004   slight mi x 1   Health Maintenance  Topic Date Due  . HEMOGLOBIN A1C  03/28/2019  . MAMMOGRAM  08/05/2019  . OPHTHALMOLOGY EXAM  08/07/2019  . FOOT EXAM  09/28/2019  . DEXA SCAN  02/04/2020  . TETANUS/TDAP  03/11/2024  . COLONOSCOPY  01/22/2025  . INFLUENZA VACCINE  Completed  .  Hepatitis C Screening  Completed  . PNA vac Low Risk Adult  Completed   Immunization History  Administered Date(s) Administered  . Influenza, High Dose Seasonal PF 04/30/2015, 05/23/2017  . Influenza-Unspecified 04/15/2013, 05/06/2014  . Pneumococcal Conjugate-13 11/01/2016  . Pneumococcal Polysaccharide-23 04/30/2015  . Tdap 03/11/2014  . Zoster 05/23/2012   Last Colon -  Last MGM -  Past Surgical History:  Procedure Laterality Date  . ANGIOPLASTY    . CARDIAC CATHETERIZATION  November 25 2004   left heart, minor  coronary atherosclerosis  . CESAREAN SECTION     x 3  . CHOLECYSTECTOMY  36 yrs ago  . EYE SURGERY Bilateral dec 2010    lens replacements  . KNEE ARTHROSCOPY WITH MEDIAL MENISECTOMY Left 02/25/2014   Procedure: LEFT KNEE ARTHROSCOPY WITH MEDIAL AND LATERAL MENISECTOMY, MICROFRACTURE, SYNOVECTOMY SUPRA PATELLA;  Surgeon: Tobi Bastos, MD;  Location: [facility];  Service: Orthopedics;  Laterality: Left;   Family History  Problem Relation Age of Onset  . Hypertension Mother   . COPD Mother   . Thyroid disease Mother   . Heart attack Father   . Heart disease Father   . Thyroid disease Sister   . Arthritis Sister        Rheumatoid  . Hypertension Sister   . Graves' disease Sister   . Colon cancer Maternal Grandfather    Social History   Tobacco Use  . Smoking status: Never Smoker  . Smokeless tobacco: Never Used  Substance Use Topics  . Alcohol use: Yes    Alcohol/week: 0.0 standard drinks    Comment: rarel; twice yearly  . Drug use: No    ROS Constitutional: Denies fever, chills, weight loss/gain, headaches, insomnia,  night sweats, and change in appetite. Does c/o fatigue. Eyes: Denies redness, blurred vision, diplopia, discharge, itchy, watery eyes.  ENT: Denies discharge, congestion, post nasal drip, epistaxis, sore throat, earache, hearing loss, dental pain, Tinnitus, Vertigo, Sinus pain, snoring.  Cardio: Denies chest pain, palpitations, irregular heartbeat, syncope, dyspnea, diaphoresis, orthopnea, PND, claudication, edema Respiratory: denies cough, dyspnea, DOE, pleurisy, hoarseness, laryngitis, wheezing.  Gastrointestinal: Denies dysphagia, heartburn, reflux, water brash, pain, cramps, nausea, vomiting, bloating, diarrhea, constipation, hematemesis, melena, hematochezia, jaundice, hemorrhoids Genitourinary: Denies dysuria, frequency, urgency, nocturia, hesitancy, discharge, hematuria, flank pain Breast: Breast lumps, nipple discharge, bleeding.  Musculoskeletal: Denies  arthralgia, myalgia, stiffness, Jt. Swelling, pain, limp, and strain/sprain. Denies falls. Skin: Denies puritis, rash, hives, warts, acne, eczema, changing in skin lesion Neuro: No weakness, tremor, incoordination, spasms, paresthesia, pain Psychiatric: Denies confusion, memory loss, sensory loss. Denies Depression. Endocrine: Denies change in weight, skin, hair change, nocturia, and paresthesia, diabetic polys, visual blurring, hyper / hypo glycemic episodes.  Heme/Lymph: No excessive bleeding, bruising, enlarged lymph nodes.  Physical Exam  BP 113/68   Pulse 60   Temp (!) 97.5 F (36.4 C)   Ht 5\' 2"  (1.575 m)   Wt 226 lb (102.5 kg)   SpO2 98%   BMI 41.34 kg/m   General Appearance: Well nourished, well groomed and in no apparent distress.  Eyes: PERRLA, EOMs, conjunctiva no swelling or erythema, normal fundi and vessels. Sinuses: No frontal/maxillary tenderness ENT/Mouth: EACs patent / TMs  nl. Nares clear without erythema, swelling, mucoid exudates. Oral hygiene is good. No erythema, swelling, or exudate. Tongue normal, non-obstructing. Tonsils not swollen or erythematous. Hearing normal.  Neck: Supple, thyroid not palpable. No bruits, nodes or JVD. Respiratory: Respiratory effort normal.  BS equal and clear bilateral without rales, rhonci, wheezing or  stridor. Cardio: Heart sounds are normal with regular rate and rhythm and no murmurs, rubs or gallops. Peripheral pulses are normal and equal bilaterally without edema. No aortic or femoral bruits. Chest: symmetric with normal excursions and percussion. Breasts: Symmetric, without lumps, nipple discharge, retractions, or fibrocystic changes.  Abdomen: Flat, soft with bowel sounds active. Nontender, no guarding, rebound, hernias, masses, or organomegaly.  Lymphatics: Non tender without lymphadenopathy.  Genitourinary:  Musculoskeletal: Full ROM all peripheral extremities, joint stability, 5/5 strength, and normal gait. Skin: Warm and  dry without rashes, lesions, cyanosis, clubbing or  ecchymosis.  Neuro: Cranial nerves intact, reflexes equal bilaterally. Normal muscle tone, no cerebellar symptoms. Sensation intact to touch, vibratory and Monofilament to the toes bilaterally. Pysch: Alert and oriented X 3, normal affect, Insight and Judgment appropriate.   Assessment and Plan  1. Annual Preventative Screening Examination  2. Essential hypertension  - EKG 12-Lead - Urinalysis, Routine w reflex microscopic - Microalbumin / creatinine urine ratio - CBC with Differential/Platelet - COMPLETE METABOLIC PANEL WITH GFR - Magnesium - TSH  3. Hyperlipidemia, mixed  - EKG 12-Lead - Lipid panel - TSH  4. Type 2 diabetes mellitus with stage 2 chronic kidney disease, without long-term current use of insulin (HCC)  - EKG 12-Lead - Urinalysis, Routine w reflex microscopic - Microalbumin / creatinine urine ratio - HM DIABETES FOOT EXAM - LOW EXTREMITY NEUR EXAM DOCUM - Hemoglobin A1c - Insulin, random  5. Vitamin D deficiency  - VITAMIN D 25 Hydroxyl  6. Gastroesophageal reflux disease  - CBC with Differential/Platelet  7. Screening for colorectal cancer  - POC Hemoccult Bld/Stl  8. Screening for ischemic heart disease  - EKG 12-Lead  9. FHx: heart disease  - EKG 12-Lead  10. Medication management  - Urinalysis, Routine w reflex microscopic - Microalbumin / creatinine urine ratio - CBC with Differential/Platelet - COMPLETE METABOLIC PANEL WITH GFR - Magnesium - Lipid panel - TSH - Hemoglobin A1c - Insulin, random - VITAMIN D 25 Hydroxyl        Patient was counseled in prudent diet to achieve/maintain BMI less than 25 for weight control, BP monitoring, regular exercise and medications. Discussed med's effects and SE's. Screening labs and tests as requested with regular follow-up as recommended. Over 40 minutes of exam, counseling, chart review and high complex critical decision making was  performed.

## 2018-09-27 NOTE — Patient Instructions (Signed)

## 2018-09-28 LAB — MICROALBUMIN / CREATININE URINE RATIO
Creatinine, Urine: 12 mg/dL — ABNORMAL LOW (ref 20–275)
Microalb Creat Ratio: 50 mcg/mg creat — ABNORMAL HIGH (ref <30)
Microalb, Ur: 0.6 mg/dL

## 2018-09-28 LAB — COMPLETE METABOLIC PANEL WITH GFR
AG Ratio: 2 (calc) (ref 1.0–2.5)
ALT: 19 U/L (ref 6–29)
AST: 16 U/L (ref 10–35)
Albumin: 4.5 g/dL (ref 3.6–5.1)
Alkaline phosphatase (APISO): 60 U/L (ref 37–153)
BUN: 10 mg/dL (ref 7–25)
CO2: 26 mmol/L (ref 20–32)
CREATININE: 0.61 mg/dL (ref 0.50–0.99)
Calcium: 9.7 mg/dL (ref 8.6–10.4)
Chloride: 105 mmol/L (ref 98–110)
GFR, Est African American: 108 mL/min/{1.73_m2} (ref >=60)
GFR, Est Non African American: 93 mL/min/{1.73_m2} (ref >=60)
Globulin: 2.2 g/dL (calc) (ref 1.9–3.7)
Glucose, Bld: 90 mg/dL (ref 65–99)
Potassium: 4 mmol/L (ref 3.5–5.3)
Sodium: 141 mmol/L (ref 135–146)
Total Bilirubin: 0.8 mg/dL (ref 0.2–1.2)
Total Protein: 6.7 g/dL (ref 6.1–8.1)

## 2018-09-28 LAB — LIPID PANEL
CHOLESTEROL: 126 mg/dL (ref <200)
HDL: 51 mg/dL (ref >=50)
LDL CHOLESTEROL (CALC): 55 mg/dL
Non-HDL Cholesterol (Calc): 75 mg/dL (calc) (ref <130)
Total CHOL/HDL Ratio: 2.5 (calc) (ref <5.0)
Triglycerides: 117 mg/dL (ref <150)

## 2018-09-28 LAB — HEMOGLOBIN A1C
HEMOGLOBIN A1C: 6.1 %{Hb} — AB (ref <5.7)
Mean Plasma Glucose: 128 (calc)
eAG (mmol/L): 7.1 (calc)

## 2018-09-28 LAB — CBC WITH DIFFERENTIAL/PLATELET
Absolute Monocytes: 397 cells/uL (ref 200–950)
Basophils Absolute: 39 cells/uL (ref 0–200)
Basophils Relative: 0.6 %
Eosinophils Absolute: 189 cells/uL (ref 15–500)
Eosinophils Relative: 2.9 %
HCT: 37.7 % (ref 35.0–45.0)
Hemoglobin: 12.7 g/dL (ref 11.7–15.5)
Lymphs Abs: 2217 cells/uL (ref 850–3900)
MCH: 28.3 pg (ref 27.0–33.0)
MCHC: 33.7 g/dL (ref 32.0–36.0)
MCV: 84 fL (ref 80.0–100.0)
MPV: 12 fL (ref 7.5–12.5)
Monocytes Relative: 6.1 %
Neutro Abs: 3660 cells/uL (ref 1500–7800)
Neutrophils Relative %: 56.3 %
PLATELETS: 226 10*3/uL (ref 140–400)
RBC: 4.49 10*6/uL (ref 3.80–5.10)
RDW: 12.9 % (ref 11.0–15.0)
TOTAL LYMPHOCYTE: 34.1 %
WBC: 6.5 10*3/uL (ref 3.8–10.8)

## 2018-09-28 LAB — VITAMIN D 25 HYDROXY (VIT D DEFICIENCY, FRACTURES): VIT D 25 HYDROXY: 96 ng/mL (ref 30–100)

## 2018-09-28 LAB — URINALYSIS, ROUTINE W REFLEX MICROSCOPIC
BILIRUBIN URINE: NEGATIVE
Glucose, UA: NEGATIVE
Hgb urine dipstick: NEGATIVE
Ketones, ur: NEGATIVE
Leukocytes,Ua: NEGATIVE
Nitrite: NEGATIVE
Protein, ur: NEGATIVE
SPECIFIC GRAVITY, URINE: 1.004 (ref 1.001–1.03)
pH: 6.5 (ref 5.0–8.0)

## 2018-09-28 LAB — MAGNESIUM: MAGNESIUM: 1.8 mg/dL (ref 1.5–2.5)

## 2018-09-28 LAB — INSULIN, RANDOM: INSULIN: 6.1 u[IU]/mL (ref 2.0–19.6)

## 2018-09-28 LAB — TSH: TSH: 3.4 mIU/L (ref 0.40–4.50)

## 2018-09-30 ENCOUNTER — Encounter: Payer: Self-pay | Admitting: Internal Medicine

## 2018-10-01 ENCOUNTER — Encounter (HOSPITAL_BASED_OUTPATIENT_CLINIC_OR_DEPARTMENT_OTHER): Payer: Self-pay | Admitting: *Deleted

## 2018-10-01 ENCOUNTER — Other Ambulatory Visit: Payer: Self-pay

## 2018-10-01 ENCOUNTER — Telehealth: Payer: Self-pay

## 2018-10-01 NOTE — Progress Notes (Signed)
Spoke w/ pt via phone for pre-op interview.  Npo after mn w/ exception clear liquids until 0430 at which time pt to finish ensure pre-surgery drink, pt vervalized understanding.  Arrive at 0530.  CBCdiff , CMP, and EKG results dated 09-27-2018 in chart and epic.  Pt to pick up ensure drink Tuesday 10-02-2018 at [facility] and printed instructions.

## 2018-10-01 NOTE — Telephone Encounter (Signed)
Back on 08/24/2018 you placed pre op labs for CBC, CMET.  When patient saw her PCP last week he did those same labs. Anesthesia is fine with using those and not drawing labs again if you are. Results are in Epic for you to view.  Let me know. Thanks

## 2018-10-02 NOTE — Progress Notes (Signed)
[name] from dr Carlis Abbott left message ok to use pcp labs from last week

## 2018-10-02 NOTE — Telephone Encounter (Signed)
Left message for [name] at [facility] that Dr. Loetta Rough fine with PCP labs.

## 2018-10-02 NOTE — Telephone Encounter (Signed)
I am good with PCP labs

## 2018-10-04 ENCOUNTER — Telehealth: Payer: Self-pay

## 2018-10-04 NOTE — Telephone Encounter (Signed)
Spoke with patient and informed her surgery is on. She said she does not anticipate any problem getting there.

## 2018-10-04 NOTE — Telephone Encounter (Signed)
I have never missed a surgery yet due to weather.

## 2018-10-04 NOTE — Anesthesia Preprocedure Evaluation (Addendum)
Anesthesia Evaluation  Patient identified by MRN, date of birth, ID band Patient awake    Reviewed: Allergy & Precautions, NPO status , Patient's Chart, lab work & pertinent test results, reviewed documented beta blocker date and time   Airway Mallampati: II  TM Distance: >3 FB Neck ROM: Full    Dental  (+) Chipped,    Pulmonary neg pulmonary ROS,    Pulmonary exam normal breath sounds clear to auscultation       Cardiovascular hypertension, Pt. on home beta blockers and Pt. on medications + CAD  Normal cardiovascular exam Rhythm:Regular Rate:Normal     Neuro/Psych negative neurological ROS  negative psych ROS   GI/Hepatic Neg liver ROS, GERD  Controlled,  Endo/Other  diabetes, Oral Hypoglycemic AgentsMorbid obesity  Renal/GU Renal disease     Musculoskeletal negative musculoskeletal ROS (+)   Abdominal (+) + obese,   Peds  Hematology HLD   Anesthesia Other Findings post menopausal bleeding, endometrial polyp  Reproductive/Obstetrics                           Anesthesia Physical Anesthesia Plan  ASA: III  Anesthesia Plan: General   Post-op Pain Management:    Induction: Intravenous  PONV Risk Score and Plan: 4 or greater and Midazolam, Dexamethasone, Ondansetron and Treatment may vary due to age or medical condition  Airway Management Planned: LMA  Additional Equipment:   Intra-op Plan:   Post-operative Plan: Extubation in OR  Informed Consent: I have reviewed the patients History and Physical, chart, labs and discussed the procedure including the risks, benefits and alternatives for the proposed anesthesia with the patient or authorized representative who has indicated his/her understanding and acceptance.     Dental advisory given  Plan Discussed with: CRNA and Anesthesiologist  Anesthesia Plan Comments:       Anesthesia Quick Evaluation

## 2018-10-04 NOTE — Telephone Encounter (Signed)
Patient is scheduled for surgery 7:30am to check in 5:30am.  She asked if due to weather her surgery might be cancelled.

## 2018-10-05 ENCOUNTER — Encounter (HOSPITAL_BASED_OUTPATIENT_CLINIC_OR_DEPARTMENT_OTHER)
Admission: RE | Disposition: A | Discharge status: Home / Self Care | Payer: Self-pay | Source: Home / Self Care | Attending: Gynecology

## 2018-10-05 ENCOUNTER — Encounter (HOSPITAL_BASED_OUTPATIENT_CLINIC_OR_DEPARTMENT_OTHER): Payer: Self-pay | Admitting: Emergency Medicine

## 2018-10-05 ENCOUNTER — Ambulatory Visit (HOSPITAL_BASED_OUTPATIENT_CLINIC_OR_DEPARTMENT_OTHER): Payer: Medicare Other | Admitting: Anesthesiology

## 2018-10-05 ENCOUNTER — Other Ambulatory Visit: Payer: Self-pay

## 2018-10-05 ENCOUNTER — Ambulatory Visit (HOSPITAL_BASED_OUTPATIENT_CLINIC_OR_DEPARTMENT_OTHER)
Admission: RE | Admit: 2018-10-05 | Discharge: 2018-10-05 | Disposition: A | Discharge status: Home / Self Care | Payer: Medicare Other | Attending: Gynecology | Admitting: Gynecology

## 2018-10-05 DIAGNOSIS — E119 Type 2 diabetes mellitus without complications: Secondary | ICD-10-CM | POA: Diagnosis not present

## 2018-10-05 DIAGNOSIS — I252 Old myocardial infarction: Secondary | ICD-10-CM | POA: Diagnosis not present

## 2018-10-05 DIAGNOSIS — Z6841 Body Mass Index (BMI) 40.0 and over, adult: Secondary | ICD-10-CM | POA: Insufficient documentation

## 2018-10-05 DIAGNOSIS — Z9862 Peripheral vascular angioplasty status: Secondary | ICD-10-CM | POA: Diagnosis not present

## 2018-10-05 DIAGNOSIS — Z79899 Other long term (current) drug therapy: Secondary | ICD-10-CM | POA: Diagnosis not present

## 2018-10-05 DIAGNOSIS — N950 Postmenopausal bleeding: Secondary | ICD-10-CM | POA: Diagnosis not present

## 2018-10-05 DIAGNOSIS — M1990 Unspecified osteoarthritis, unspecified site: Secondary | ICD-10-CM | POA: Insufficient documentation

## 2018-10-05 DIAGNOSIS — Z7982 Long term (current) use of aspirin: Secondary | ICD-10-CM | POA: Diagnosis not present

## 2018-10-05 DIAGNOSIS — E7800 Pure hypercholesterolemia, unspecified: Secondary | ICD-10-CM | POA: Diagnosis not present

## 2018-10-05 DIAGNOSIS — I10 Essential (primary) hypertension: Secondary | ICD-10-CM | POA: Diagnosis not present

## 2018-10-05 DIAGNOSIS — I2510 Atherosclerotic heart disease of native coronary artery without angina pectoris: Secondary | ICD-10-CM | POA: Insufficient documentation

## 2018-10-05 DIAGNOSIS — Z7984 Long term (current) use of oral hypoglycemic drugs: Secondary | ICD-10-CM | POA: Insufficient documentation

## 2018-10-05 DIAGNOSIS — N840 Polyp of corpus uteri: Secondary | ICD-10-CM | POA: Insufficient documentation

## 2018-10-05 DIAGNOSIS — K219 Gastro-esophageal reflux disease without esophagitis: Secondary | ICD-10-CM | POA: Diagnosis not present

## 2018-10-05 HISTORY — PX: DILATATION & CURETTAGE/HYSTEROSCOPY WITH MYOSURE: SHX6511

## 2018-10-05 HISTORY — DX: Chronic kidney disease, stage 2 (mild): N18.2

## 2018-10-05 HISTORY — DX: Personal history of other diseases of the circulatory system: Z86.79

## 2018-10-05 HISTORY — DX: Polyp of corpus uteri: N84.0

## 2018-10-05 HISTORY — DX: Atherosclerotic heart disease of native coronary artery without angina pectoris: I25.10

## 2018-10-05 HISTORY — DX: Type 2 diabetes mellitus without complications: E11.9

## 2018-10-05 LAB — GLUCOSE, CAPILLARY: Glucose-Capillary: 126 mg/dL — ABNORMAL HIGH (ref 70–99)

## 2018-10-05 SURGERY — DILATATION & CURETTAGE/HYSTEROSCOPY WITH MYOSURE
Anesthesia: General | Site: "Vagina "

## 2018-10-05 MED ORDER — MIDAZOLAM HCL 2 MG/2ML IJ SOLN
INTRAMUSCULAR | Status: AC
  Filled 2018-10-05: qty 2

## 2018-10-05 MED ORDER — LIDOCAINE 2% (20 MG/ML) 5 ML SYRINGE
INTRAMUSCULAR | Status: DC | PRN
  Administered 2018-10-05: 60 mg via INTRAVENOUS

## 2018-10-05 MED ORDER — SODIUM CHLORIDE 0.9 % IV SOLN
2.0000 g | INTRAVENOUS | Status: AC
  Administered 2018-10-05: 2 g via INTRAVENOUS
  Filled 2018-10-05: qty 2

## 2018-10-05 MED ORDER — EPHEDRINE 5 MG/ML INJ
INTRAVENOUS | Status: AC
  Filled 2018-10-05: qty 10

## 2018-10-05 MED ORDER — ACETAMINOPHEN 500 MG PO TABS
1000.0000 mg | ORAL_TABLET | Freq: Once | ORAL | Status: AC
  Administered 2018-10-05: 1000 mg via ORAL
  Filled 2018-10-05: qty 2

## 2018-10-05 MED ORDER — DEXAMETHASONE SODIUM PHOSPHATE 10 MG/ML IJ SOLN
INTRAMUSCULAR | Status: AC
  Filled 2018-10-05: qty 1

## 2018-10-05 MED ORDER — SODIUM CHLORIDE 0.9 % IR SOLN
Status: DC | PRN
  Administered 2018-10-05: 3000 mL

## 2018-10-05 MED ORDER — SODIUM CHLORIDE 0.9 % IV SOLN
INTRAVENOUS | Status: AC
  Filled 2018-10-05: qty 2

## 2018-10-05 MED ORDER — LIDOCAINE HCL 1 % IJ SOLN
INTRAMUSCULAR | Status: DC | PRN
  Administered 2018-10-05: 9 mL

## 2018-10-05 MED ORDER — KETOROLAC TROMETHAMINE 30 MG/ML IJ SOLN
INTRAMUSCULAR | Status: AC
  Filled 2018-10-05: qty 1

## 2018-10-05 MED ORDER — ONDANSETRON HCL 4 MG/2ML IJ SOLN
4.0000 mg | Freq: Once | INTRAMUSCULAR | Status: AC | PRN
  Administered 2018-10-05: 4 mg via INTRAVENOUS
  Filled 2018-10-05: qty 2

## 2018-10-05 MED ORDER — DEXAMETHASONE SODIUM PHOSPHATE 10 MG/ML IJ SOLN
INTRAMUSCULAR | Status: DC | PRN
  Administered 2018-10-05: 10 mg via INTRAVENOUS

## 2018-10-05 MED ORDER — KETOROLAC TROMETHAMINE 30 MG/ML IJ SOLN
INTRAMUSCULAR | Status: DC | PRN
  Administered 2018-10-05: 30 mg via INTRAVENOUS

## 2018-10-05 MED ORDER — KETOROLAC TROMETHAMINE 15 MG/ML IJ SOLN
15.0000 mg | Freq: Once | INTRAMUSCULAR | Status: DC | PRN
  Filled 2018-10-05: qty 1

## 2018-10-05 MED ORDER — FENTANYL CITRATE (PF) 100 MCG/2ML IJ SOLN
INTRAMUSCULAR | Status: AC
  Filled 2018-10-05: qty 2

## 2018-10-05 MED ORDER — MIDAZOLAM HCL 2 MG/2ML IJ SOLN
INTRAMUSCULAR | Status: DC | PRN
  Administered 2018-10-05: 1 mg via INTRAVENOUS

## 2018-10-05 MED ORDER — LIDOCAINE 2% (20 MG/ML) 5 ML SYRINGE
INTRAMUSCULAR | Status: AC
  Filled 2018-10-05: qty 5

## 2018-10-05 MED ORDER — PROPOFOL 10 MG/ML IV BOLUS
INTRAVENOUS | Status: DC | PRN
  Administered 2018-10-05: 160 mg via INTRAVENOUS

## 2018-10-05 MED ORDER — ARTIFICIAL TEARS OPHTHALMIC OINT
TOPICAL_OINTMENT | OPHTHALMIC | Status: AC
  Filled 2018-10-05: qty 3.5

## 2018-10-05 MED ORDER — FENTANYL CITRATE (PF) 100 MCG/2ML IJ SOLN
25.0000 ug | INTRAMUSCULAR | Status: DC | PRN
  Filled 2018-10-05: qty 1

## 2018-10-05 MED ORDER — ACETAMINOPHEN 500 MG PO TABS
ORAL_TABLET | ORAL | Status: AC
  Filled 2018-10-05: qty 2

## 2018-10-05 MED ORDER — EPHEDRINE SULFATE 50 MG/ML IJ SOLN
INTRAMUSCULAR | Status: DC | PRN
  Administered 2018-10-05 (×2): 10 mg via INTRAVENOUS

## 2018-10-05 MED ORDER — LACTATED RINGERS IV SOLN
INTRAVENOUS | Status: DC
  Administered 2018-10-05 (×3): via INTRAVENOUS
  Filled 2018-10-05: qty 1000

## 2018-10-05 MED ORDER — PROPOFOL 10 MG/ML IV BOLUS
INTRAVENOUS | Status: AC
  Filled 2018-10-05: qty 40

## 2018-10-05 MED ORDER — ONDANSETRON HCL 4 MG/2ML IJ SOLN
INTRAMUSCULAR | Status: AC
  Filled 2018-10-05: qty 2

## 2018-10-05 MED ORDER — FENTANYL CITRATE (PF) 100 MCG/2ML IJ SOLN
INTRAMUSCULAR | Status: DC | PRN
  Administered 2018-10-05: 100 ug via INTRAVENOUS

## 2018-10-05 SURGICAL SUPPLY — 21 items
CANISTER SUCT 3000ML PPV (MISCELLANEOUS) ×4 IMPLANT
CATH ROBINSON RED A/P 16FR (CATHETERS) ×4 IMPLANT
COVER WAND RF STERILE (DRAPES) ×4 IMPLANT
DEVICE MYOSURE LITE (MISCELLANEOUS) IMPLANT
DEVICE MYOSURE REACH (MISCELLANEOUS) ×3 IMPLANT
DILATOR CANAL MILEX (MISCELLANEOUS) IMPLANT
GAUZE 4X4 16PLY RFD (DISPOSABLE) ×4 IMPLANT
GLOVE BIO SURGEON STRL SZ7.5 (GLOVE) ×8 IMPLANT
GLOVE BIOGEL PI IND STRL 7.5 (GLOVE) ×3 IMPLANT
GLOVE BIOGEL PI INDICATOR 7.5 (GLOVE) ×6
GOWN STRL REUS W/TWL XL LVL3 (GOWN DISPOSABLE) ×4 IMPLANT
IV NS IRRIG 3000ML ARTHROMATIC (IV SOLUTION) ×4 IMPLANT
KIT PROCEDURE FLUENT (KITS) ×4 IMPLANT
KIT TURNOVER CYSTO (KITS) ×4 IMPLANT
MYOSURE XL FIBROID (MISCELLANEOUS)
PACK VAGINAL MINOR WOMEN LF (CUSTOM PROCEDURE TRAY) ×4 IMPLANT
PAD OB MATERNITY 4.3X12.25 (PERSONAL CARE ITEMS) ×4 IMPLANT
SEAL ROD LENS SCOPE MYOSURE (ABLATOR) ×4 IMPLANT
SYSTEM TISS REMOVAL MYOSURE XL (MISCELLANEOUS) IMPLANT
TOWEL OR 17X26 10 PK STRL BLUE (TOWEL DISPOSABLE) ×8 IMPLANT
WATER STERILE IRR 500ML POUR (IV SOLUTION) IMPLANT

## 2018-10-05 NOTE — H&P (Signed)
The patient was examined.  I reviewed the proposed surgery and consent form with the patient.  The dictated history and physical is current and accurate and all questions were answered. The patient is ready to proceed with surgery and has a realistic understanding and expectation for the outcome.   Anastasio Auerbach MD, 7:10 AM 10/05/2018

## 2018-10-05 NOTE — Discharge Instructions (Signed)

## 2018-10-05 NOTE — Anesthesia Postprocedure Evaluation (Signed)
Anesthesia Post Note  Patient: HAJA CREGO  Procedure(s) Performed: DILATATION & CURETTAGE/HYSTEROSCOPY WITH MYOSURE (N/A Vagina )     Patient location during evaluation: PACU Anesthesia Type: General Level of consciousness: awake and alert Pain management: pain level controlled Vital Signs Assessment: post-procedure vital signs reviewed and stable Respiratory status: spontaneous breathing, nonlabored ventilation, respiratory function stable and patient connected to nasal cannula oxygen Cardiovascular status: blood pressure returned to baseline and stable Postop Assessment: no apparent nausea or vomiting Anesthetic complications: no Comments: Patient monitored for an extended period of time in PACU as well as Phase II due to frequent PVC's. Patient remained hemodynamically stable. Notification to and follow up with PCP recommended to patient and family.    Last Vitals:  Vitals:   10/05/18 1015 10/05/18 1124  BP:  (!) 155/87  Pulse: 98 (!) 55  Resp: 19 16  Temp:  36.8 C  SpO2: 97% 100%    Last Pain:  Vitals:   10/05/18 1124  TempSrc:   PainSc: 0-No pain                 Kaia Depaolis P Starlit Raburn

## 2018-10-05 NOTE — Transfer of Care (Signed)
Immediate Anesthesia Transfer of Care Note  Patient: [name]  Procedure(s) Performed: DILATATION & CURETTAGE/HYSTEROSCOPY WITH MYOSURE (N/A Vagina )  Patient Location: PACU  Anesthesia Type:General  Level of Consciousness: awake, alert , oriented and patient cooperative  Airway & Oxygen Therapy: Patient Spontanous Breathing and Patient connected to nasal cannula oxygen  Post-op Assessment: Report given to RN and Post -op Vital signs reviewed and stable  Post vital signs: Reviewed and stable  Last Vitals:  Vitals Value Taken Time  BP 109/95 10/05/2018  8:19 AM  Temp    Pulse 96 10/05/2018  8:22 AM  Resp 12 10/05/2018  8:22 AM  SpO2 98 % 10/05/2018  8:22 AM  Vitals shown include unvalidated device data.  Last Pain:  Vitals:   10/05/18 0655  TempSrc: Oral         Complications: No apparent anesthesia complications

## 2018-10-05 NOTE — Op Note (Signed)
[name] 01-28-1951 [id number]   Post Operative Note   Date of surgery:  10/05/2018  Pre Op Dx: Postmenopausal bleeding, endometrial polyp  Post Op Dx: Postmenopausal bleeding, endometrial polyp  Procedure: Hysteroscopy, D&C, MyoSure resection endometrial polyp  Surgeon:  Belinda Block Roy Snuffer  Anesthesia:  General  EBL: 5 cc  Distended media discrepancy: 85 cc saline  Complications:  None  Specimen: #1 endometrial curetting #2 endometrial polyp to pathology  Findings: EUA: External BUS vagina with atrophic changes.  Cervix with atrophic changes.  Uterus difficult to palpate but no gross pelvic masses on bimanual.   Hysteroscopy: Adequate noting fundus, right/left tubal ostia, anterior/posterior endometrial surfaces, lower uterine segment and endocervical canal all visualized.  Large endometrial polyp originating from right fundus protruding down the cervical canal resected in it's entirety.  The remainder of the endometrial surface was atrophic in appearance.  Procedure:  The patient was taken to the operating room, was placed in the low dorsal lithotomy position, underwent general anesthesia, received a perineal/vaginal preparation per nursing personnel and the bladder was emptied with an in and out Foley catheterization. The timeout was performed by the surgical team. An EUA was performed. The patient was draped in the usual fashion. The cervix was visualized with a speculum, anterior lip grasped with a single-tooth tenaculum and a paracervical block was placed using 10 cc's of 1% lidocaine. The cervix was gently dilated to admit the Myosure hysteroscope and hysteroscopy was performed with findings noted above. Using the Myosure Reach resectoscopic wand the polyp was resected in it's entirety to the level the surrounding endometrium. A gentle sharp curettage was performed.  The endometrial cavity was atrophic after removal of the polyp and scant return was noted on the [facility].  Both  specimens were sent separately to pathology.  Repeat hysteroscopy showed an empty cavity with good distention and no evidence of perforation. The instruments were removed and adequate hemostasis was visualized at the tenaculum site and external cervical os.  The specimens were identified for pathology.  The sponge, needle and instrument count were verified correct.  The patient was given intraoperative Toradol, was awakened without difficulty and was taken to the recovery room in good condition having tolerated the procedure well.    Anastasio Auerbach MD, 8:21 AM 10/05/2018

## 2018-10-05 NOTE — Anesthesia Procedure Notes (Signed)
Procedure Name: LMA Insertion Date/Time: 10/05/2018 7:33 AM Performed by: Wanita Chamberlain, CRNA Pre-anesthesia Checklist: Patient identified, Emergency Drugs available, Suction available, Patient being monitored and Timeout performed Patient Re-evaluated:Patient Re-evaluated prior to induction Oxygen Delivery Method: Circle system utilized Preoxygenation: Pre-oxygenation with 100% oxygen Induction Type: IV induction Ventilation: Mask ventilation without difficulty LMA: LMA inserted LMA Size: 4.0 Number of attempts: 1 Placement Confirmation: breath sounds checked- equal and bilateral,  CO2 detector and positive ETCO2 Tube secured with: Tape Dental Injury: Teeth and Oropharynx as per pre-operative assessment

## 2018-10-08 ENCOUNTER — Encounter (HOSPITAL_BASED_OUTPATIENT_CLINIC_OR_DEPARTMENT_OTHER): Payer: Self-pay | Admitting: Gynecology

## 2018-10-11 ENCOUNTER — Other Ambulatory Visit: Payer: Self-pay | Admitting: Adult Health

## 2018-10-16 ENCOUNTER — Other Ambulatory Visit: Payer: Self-pay

## 2018-10-16 DIAGNOSIS — Z1212 Encounter for screening for malignant neoplasm of rectum: Principal | ICD-10-CM

## 2018-10-16 DIAGNOSIS — Z1211 Encounter for screening for malignant neoplasm of colon: Secondary | ICD-10-CM

## 2018-10-16 LAB — POC HEMOCCULT BLD/STL (HOME/3-CARD/SCREEN)
Card #3 Fecal Occult Blood, POC: NEGATIVE
FECAL OCCULT BLD: NEGATIVE
Fecal Occult Blood, POC: NEGATIVE

## 2018-10-23 ENCOUNTER — Ambulatory Visit (INDEPENDENT_AMBULATORY_CARE_PROVIDER_SITE_OTHER): Payer: Medicare Other | Admitting: Gynecology

## 2018-10-23 ENCOUNTER — Encounter: Payer: Self-pay | Admitting: Gynecology

## 2018-10-23 VITALS — BP 120/74

## 2018-10-23 DIAGNOSIS — Z4889 Encounter for other specified surgical aftercare: Secondary | ICD-10-CM

## 2018-10-23 NOTE — Patient Instructions (Signed)
Follow-up with any GYN issues.

## 2018-10-23 NOTE — Progress Notes (Signed)
    CARIGAN LISTER May 04, 1951 [id number]        68 y.o.  G3P3 presents for her postoperative visit status post hysteroscopic resection endometrial polyps.  Has done well since surgery.  Past medical history,surgical history, problem list, medications, allergies, family history and social history were all reviewed and documented in the EPIC chart.  Directed ROS with pertinent positives and negatives documented in the history of present illness/assessment and plan.  Exam: [name] assistant Vitals:   10/23/18 1415  BP: 120/74   General appearance:  Normal Abdomen soft nontender without masses guarding rebound Pelvic external BUS vagina with atrophic changes.  Cervix with atrophic changes.  Bimanual exam without masses or tenderness.  Assessment/Plan:  68 y.o. G3P3 with normal postoperative visit.  Reviewed pictures of the surgery and her benign pathology showing a benign endometrioid polyp.  Patient will follow-up if she does any further bleeding or has any other GYN issues.    Anastasio Auerbach MD, 3:05 PM 10/23/2018

## 2018-11-09 ENCOUNTER — Other Ambulatory Visit: Payer: Self-pay

## 2018-11-09 NOTE — Patient Outreach (Signed)
[facility]) Care Management  11/09/2018  KLA BILY 1951/07/09 [id number]   Medication Adherence call to Mrs. Kayleen Memos spoke with patient she ask to call back because she is at work (hung up phone) Mrs. Cannaday is showing past on Metformin Er 500 mg under [place].   [facility] Direct Dial [phone]  Fax [phone] [email or web address]

## 2018-11-14 ENCOUNTER — Other Ambulatory Visit: Payer: Self-pay | Admitting: Internal Medicine

## 2018-11-14 DIAGNOSIS — M5431 Sciatica, right side: Secondary | ICD-10-CM

## 2018-11-21 ENCOUNTER — Other Ambulatory Visit: Payer: Self-pay | Admitting: Internal Medicine

## 2018-12-19 ENCOUNTER — Ambulatory Visit: Payer: Self-pay | Admitting: Adult Health

## 2019-01-02 DIAGNOSIS — E1122 Type 2 diabetes mellitus with diabetic chronic kidney disease: Secondary | ICD-10-CM | POA: Insufficient documentation

## 2019-01-02 NOTE — Progress Notes (Signed)
MEDICARE ANNUAL WELLNESS VISIT AND FOLLOW UP  Assessment:   Diagnoses and all orders for this visit:  Encounter for Medicare annual wellness exam  Takotsubo syndrome No symptoms for over 5 years Has been released by cardiology  Stress-induced cardiomyopathy  Has been released by cardiology; monitor   Hemorrhoids, unspecified hemorrhoid type Asymptomatic at this time; use anusol PRN  Essential hypertension Continue medications Monitor blood pressure at home; call if consistently over 130/80 Continue DASH diet.   Reminder to go to the ER if any CP, SOB, nausea, dizziness, severe HA, changes vision/speech, left arm numbness and tingling and jaw pain.  Type 2 diabetes mellitus with stage 2 chronic kidney disease, without long-term current use of insulin [facility]) Education: Reviewed 'ABCs' of diabetes management (respective goals in parentheses):  A1C (<7), blood pressure (<130/80), and cholesterol (LDL <70) Eye Exam yearly and Dental Exam every 6 months- up to date metformin 4 tabs daily  Dietary recommendations Physical Activity recommendations Foot exam up to date   DJD Managed by OTC analgesics Followed by [facility] Dr. Gladstone Lighter, Dr. Veverly Fells Weight loss advised  Vitamin D deficiency Continue supplementation Check vitamin D level q 6 months or as needed  Morbid obesity (BMI 40)  Long discussion about weight loss, diet, and exercise Recommended diet heavy in fruits and veggies and low in animal meats, cheeses, and dairy products, appropriate calorie intake Discussed appropriate weight for height, she would prefer not to set weight goal, working on general good habits with goal to normalize A1C and get off meds Follow up at next visit  Medication management CBC, CMP/GFR, magnesium   Hyperlipidemia Continue medications Continue low cholesterol diet and exercise.  Check lipid panel.   Gastroesophageal reflux disease, esophagitis presence not specified Well managed on  current medications Discussed diet, avoiding triggers and other lifestyle changes     Over 40 minutes of exam, counseling, chart review and critical decision making was performed Future Appointments  Date Time Provider [place]  04/12/2019  9:30 AM [name], MD [facility] None  11/11/2019  3:00 PM [name], MD [facility] None     Plan:   During the course of the visit the patient was educated and counseled about appropriate screening and preventive services including:    Pneumococcal vaccine   Prevnar 13  Influenza vaccine  Td vaccine  Screening electrocardiogram  Bone densitometry screening  Colorectal cancer screening  Diabetes screening  Glaucoma screening  Nutrition counseling   Advanced directives: requested   Subjective:  [name] is a 68 y.o. female who presents for Medicare Annual Wellness Visit and 3 month follow up.   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 Has been released by cardiology.   She had R sided sciatica but has spontaneously resolved after recent D&C; she has known L knee arthritis after meniscal repar, but is doing very well and not needing meds or follow up at this time. Established with [facility].   she has a diagnosis of GERD which is currently doing well off of medications.  she reports symptoms is currently well controlled, and denies breakthrough reflux, burning in chest, hoarseness or cough.    BMI is Body mass index is 40.24 kg/m., she has been eating out less, doing more salads. She is somewhat acitve at work, up and down all day. Takes a walk once weekly. She is  cutting down on diet soda, pushing unsweet tea and water.  Wt Readings from Last 3 Encounters:  01/03/19 220 lb (99.8 kg)  10/05/18 224 lb 2 oz (101.7 kg)  09/27/18 226 lb (102.5 kg)   She does not check BP at home, today their BP is  BP: 132/78 She does not workout. She denies chest pain, shortness of breath, dizziness.   She is on cholesterol medication (rosuvastatin 20 mg daily) and denies myalgias. Her cholesterol is at goal. The cholesterol last visit was:   Lab Results  Component Value Date   CHOL 126 09/27/2018   HDL 51 09/27/2018   LDLCALC 55 09/27/2018   TRIG 117 09/27/2018   CHOLHDL 2.5 09/27/2018    She has not been working on diet and exercise for T2DM well controlled by metformin, and denies foot ulcerations, increased appetite, nausea, paresthesia of the feet, polydipsia, polyuria, visual disturbances, vomiting and weight loss. She does have glucometer but hasn't been checking due to recent very well controlled values. Last A1C in the office was:  Lab Results  Component Value Date   HGBA1C 6.1 (H) 09/27/2018   Last GFR: Lab Results  Component Value Date   GFRNONAA 93 09/27/2018   Patient is on Vitamin D supplement and at goal at recent check:    Lab Results  Component Value Date   VD25OH 96 09/27/2018      Medication Review: Current Outpatient Medications on File Prior to Visit  Medication Sig Dispense Refill  . aspirin EC 81 MG tablet Take 81 mg by mouth daily.    . bisoprolol-hydrochlorothiazide (ZIAC) 10-6.25 MG tablet TAKE 1 TABLET BY MOUTH DAILY (Patient taking differently: Take 1 tablet by mouth every morning. ) 90 tablet 3  . Cholecalciferol (VITAMIN D-3) 5000 UNITS TABS Take 2 tablets by mouth every morning.     Marland Kitchen losartan (COZAAR) 50 MG tablet TAKE 1 TABLET(50 MG) BY MOUTH DAILY 90 tablet 3  . metFORMIN (GLUCOPHAGE-XR) 500 MG 24 hr tablet TAKE 1 TABLET BY MOUTH WITH BREAKFAST, 1 TABLET WITH LUNCH AND 2 TABLETS WITH SUPPER DAILY (Patient taking differently: Take 2 tablets in the morning and 2 tablets at night) 360 tablet 1  . rosuvastatin (CRESTOR) 40 MG tablet Take 1 tablet daily for Cholesterol (Patient taking differently: Take 20 mg by mouth every evening. Take 1/2 tablet daily for  Cholesterol) 30 tablet 5  . gabapentin (NEURONTIN) 300 MG capsule TAKE ONE CAPSULE BY MOUTH THREE TIMES DAILY FOR SCIATICA PAIN (Patient not taking: Reported on 01/03/2019) 270 capsule 3   No current facility-administered medications on file prior to visit.     No Known Allergies  Current Problems (verified) Patient Active Problem List   Diagnosis Date Noted  . CKD stage 2 due to type 2 diabetes mellitus ([place]) 01/02/2019  . Acid reflux 12/11/2017  . Takotsubo syndrome 07/16/2015  . Morbid obesity (BMI 39)  06/02/2014  . Osteoarthritis of left knee 02/25/2014  . Hyperlipidemia associated with type 2 diabetes mellitus ([place]) 12/08/2013  . Vitamin D deficiency 12/08/2013  . Medication management 12/08/2013  . T2_NIDDM w/Stage 2 CKD (GFR 76 ml/min) 09/13/2013  . Stress-induced cardiomyopathy 11/18/2010  . Essential hypertension 10/29/2007  . Hemorrhoids 10/29/2007  . DJD 10/29/2007    Screening Tests Immunization History  Administered Date(s) Administered  . Influenza, High Dose Seasonal PF 04/30/2015, 05/23/2017  . Influenza-Unspecified 04/15/2013, 05/06/2014  . Pneumococcal Conjugate-13 11/01/2016  . Pneumococcal Polysaccharide-23 04/30/2015  . Tdap 03/11/2014  . Zoster 05/23/2012  Preventative care: Last colonoscopy: 2016 due 2026 Pap: 06/2018 by Dr. Phineas Real - endometrial polyp - had D&C 09/2018 - no further follow up recommended DEXA: 01/2018 T - 0.7  MM: 07/2018  Prior vaccinations: TD or Tdap: 2015  Influenza: 2019  Pneumococcal: 2016 Prevnar13: 2018 Shingles/Zostavax: 2013  Names of Other Physician/Practitioners you currently use: 1. [facility] here for primary care 2. Dr. Delman Cheadle, eye doctor, last visit 08/06/2018 - report received and abstracted  3. Dr. Orvil Feil, dentist, last visit 09/2018, goes q68m  Patient Care Team: [name], MD as PCP - General (Internal Medicine) Melissa Noon, [place] as Referring Physician  (Optometry)  SURGICAL HISTORY She  has a past surgical history that includes Cesarean section (x3   last one 10/ 1988); Knee arthroscopy with medial menisectomy (Left, 02/25/2014); Cholecystectomy open (1979); Cataract extraction w/ intraocular lens  implant, bilateral (07/2009); Toe Surgery (1990); Cardiac catheterization (11/25/2004  dr Lia Foyer); Cardiac catheterization (10/10/2002   dr Lyndel Safe); and Dilatation & curettage/hysteroscopy with myosure (N/A, 10/05/2018). FAMILY HISTORY Her family history includes Arthritis in her sister; COPD in her mother; Colon cancer in her maternal grandfather; Berenice Primas' disease in her sister; Heart attack in her father; Heart disease in her father; Hypertension in her mother and sister; Thyroid disease in her mother and sister. SOCIAL HISTORY She  reports that she has never smoked. She has never used smokeless tobacco. She reports current alcohol use. She reports that she does not use drugs.   MEDICARE WELLNESS OBJECTIVES: Physical activity: Current Exercise Habits: Home exercise routine, Type of exercise: walking, Time (Minutes): 30, Frequency (Times/Week): 1, Weekly Exercise (Minutes/Week): 30, Intensity: Mild, Exercise limited by: None identified Cardiac risk factors: Cardiac Risk Factors include: advanced age (>70men, >57 women);diabetes mellitus;dyslipidemia;hypertension;obesity (BMI >30kg/m2);sedentary lifestyle Depression/mood screen:   Depression screen [facility] 2/9 01/03/2019  Decreased Interest 0  Down, Depressed, Hopeless 0  PHQ - 2 Score 0    ADLs:  In your present state of health, do you have any difficulty performing the following activities: 01/03/2019 10/05/2018  Hearing? N N  Vision? N N  Difficulty concentrating or making decisions? N N  Walking or climbing stairs? N N  Dressing or bathing? N N  Doing errands, shopping? N -  Some recent data might be hidden     Cognitive Testing  Alert? Yes  Normal Appearance?Yes  Oriented to person? Yes   Place? Yes   Time? Yes  Recall of three objects?  Yes  Can perform simple calculations? Yes  Displays appropriate judgment?Yes  Can read the correct time from a watch face?Yes  EOL planning: Does Patient Have a Medical Advance Directive?: Yes Type of Advance Directive: Healthcare Power of Attorney, Living will Does patient want to make changes to medical advance directive?: No - Patient declined Copy of [place] in Chart?: No - copy requested  Review of Systems  Constitutional: Negative for malaise/fatigue and weight loss.  HENT: Negative for hearing loss and tinnitus.   Eyes: Negative for blurred vision and double vision.  Respiratory: Negative for cough, sputum production, shortness of breath and wheezing.   Cardiovascular: Negative for chest pain, palpitations, orthopnea, claudication, leg swelling and PND.  Gastrointestinal: Negative for abdominal pain, blood in stool, constipation, diarrhea, heartburn, melena, nausea and vomiting.  Genitourinary: Negative.   Musculoskeletal: Negative for falls, joint pain and myalgias.  Skin: Negative for rash.  Neurological: Negative for dizziness, tingling, sensory change, weakness and headaches.  Endo/Heme/Allergies: Negative for polydipsia.  Psychiatric/Behavioral: Negative.  Negative for depression, memory loss, substance abuse and suicidal ideas. The patient is not nervous/anxious and does not have insomnia.   All other systems reviewed and are negative.    Objective:     Today's Vitals   01/03/19 1004  BP: 132/78  Pulse: 61  Temp: (!) 96.9 F (36.1 C)  SpO2: 98%  Weight: 220 lb (99.8 kg)  Height: 5\' 2"  (1.575 m)   Body mass index is 40.24 kg/m.  General appearance: alert, no distress, WD/WN, female HEENT: normocephalic, sclerae anicteric, TMs pearly, nares patent, no discharge or erythema, pharynx normal Oral cavity: MMM, no lesions Neck: supple, no lymphadenopathy, no thyromegaly, no masses Heart: RRR,  normal S1, S2, no murmurs Lungs: CTA bilaterally, no wheezes, rhonchi, or rales Abdomen: +bs, soft, non tender, non distended, no masses, no hepatomegaly, no splenomegaly Musculoskeletal: nontender, no swelling, no obvious deformity Extremities: no edema, no cyanosis, no clubbing Pulses: 2+ symmetric, upper and lower extremities, normal cap refill Neurological: alert, oriented x 3, CN2-12 intact, strength normal upper extremities and lower extremities, sensation normal throughout, DTRs 2+ throughout, no cerebellar signs, gait normal Psychiatric: normal affect, behavior normal, pleasant   Medicare Attestation I have personally reviewed: The patient's medical and social history Their use of alcohol, tobacco or illicit drugs Their current medications and supplements The patient's functional ability including ADLs,fall risks, home safety risks, cognitive, and hearing and visual impairment Diet and physical activities Evidence for depression or mood disorders  The patient's weight, height, BMI, and visual acuity have been recorded in the chart.  I have made referrals, counseling, and provided education to the patient based on review of the above and I have provided the patient with a written personalized care plan for preventive services.     Izora Ribas, NP   01/03/2019

## 2019-01-03 ENCOUNTER — Other Ambulatory Visit: Payer: Self-pay

## 2019-01-03 ENCOUNTER — Encounter: Payer: Self-pay | Admitting: Adult Health

## 2019-01-03 ENCOUNTER — Ambulatory Visit: Payer: Medicare Other | Admitting: Adult Health

## 2019-01-03 VITALS — BP 132/78 | HR 61 | Temp 96.9°F | Ht 62.0 in | Wt 220.0 lb

## 2019-01-03 DIAGNOSIS — R6889 Other general symptoms and signs: Secondary | ICD-10-CM | POA: Diagnosis not present

## 2019-01-03 DIAGNOSIS — Z0000 Encounter for general adult medical examination without abnormal findings: Secondary | ICD-10-CM

## 2019-01-03 DIAGNOSIS — M159 Polyosteoarthritis, unspecified: Secondary | ICD-10-CM

## 2019-01-03 DIAGNOSIS — N182 Chronic kidney disease, stage 2 (mild): Secondary | ICD-10-CM

## 2019-01-03 DIAGNOSIS — M5431 Sciatica, right side: Secondary | ICD-10-CM

## 2019-01-03 DIAGNOSIS — E1122 Type 2 diabetes mellitus with diabetic chronic kidney disease: Secondary | ICD-10-CM | POA: Diagnosis not present

## 2019-01-03 DIAGNOSIS — K649 Unspecified hemorrhoids: Secondary | ICD-10-CM

## 2019-01-03 DIAGNOSIS — E1169 Type 2 diabetes mellitus with other specified complication: Secondary | ICD-10-CM

## 2019-01-03 DIAGNOSIS — Z79899 Other long term (current) drug therapy: Secondary | ICD-10-CM

## 2019-01-03 DIAGNOSIS — K219 Gastro-esophageal reflux disease without esophagitis: Secondary | ICD-10-CM | POA: Diagnosis not present

## 2019-01-03 DIAGNOSIS — Z0001 Encounter for general adult medical examination with abnormal findings: Secondary | ICD-10-CM

## 2019-01-03 DIAGNOSIS — I5181 Takotsubo syndrome: Secondary | ICD-10-CM

## 2019-01-03 DIAGNOSIS — I10 Essential (primary) hypertension: Secondary | ICD-10-CM | POA: Diagnosis not present

## 2019-01-03 DIAGNOSIS — E785 Hyperlipidemia, unspecified: Secondary | ICD-10-CM

## 2019-01-03 DIAGNOSIS — E559 Vitamin D deficiency, unspecified: Secondary | ICD-10-CM

## 2019-01-03 NOTE — Patient Instructions (Signed)
Ms. [name] , Thank you for taking time to come for your Medicare Wellness Visit. I appreciate your ongoing commitment to your health goals. Please review the following plan we discussed and let me know if I can assist you in the future.   These are the goals we discussed: Goals    . Exercise 5x per week (15-20 min per time)     Moderate intensity - enough to get heart rate up and be unable to speak in complete sentences    . HEMOGLOBIN A1C < 5.7       This is a list of the screening recommended for you and due dates:  Health Maintenance  Topic Date Due  . Flu Shot  03/16/2019  . Hemoglobin A1C  03/28/2019  . Mammogram  08/05/2019  . Eye exam for diabetics  08/07/2019  . Complete foot exam   09/28/2019  . DEXA scan (bone density measurement)  02/04/2020  . Tetanus Vaccine  03/11/2024  . Colon Cancer Screening  01/22/2025  .  Hepatitis C: One time screening is recommended by Center for Disease Control  (CDC) for  adults born from [birth year] through 1966.   Completed  . Pneumonia vaccines  Completed     Drink 1/2 your body weight in fluid ounces of water daily; drink a tall glass of water 30 min before meals  Don't eat until you're stuffed- listen to your stomach and eat until you are 80% full   Try eating off of a salad plate; wait 10 min after finishing before going back for seconds  Start by eating the vegetables on your plate; aim for 50% of your meals to be fruits or vegetables  Then eat your protein - lean meats (grass fed if possible), fish, beans, nuts in moderation  Eat your carbs/starch last ONLY if you still are hungry. If you can, stop before finishing it all  Avoid sugar and flour - the closer it looks to it's original form in nature, typically the better it is for you  Splurge in moderation - "assign" days when you get to splurge and have the "bad stuff" - I like to follow a 80% - 20% plan- "good" choices 80 % of the time, "bad" choices in moderation 20% of the  time  Simple equation is: Calories out > calories in = weight loss - even if you eat the bad stuff, if you limit portions, you will still lose weight    SMALL CHANGES  We want weight loss that will last so you should lose 1-2 pounds a week.  THAT IS IT! Please pick THREE things a month to change. Once it is a habit check off the item. Then pick another three items off the list to become habits.  If you are already doing a habit on the list GREAT!  Cross that item off! o Don't drink your calories. Ie, alcohol, soda, fruit juice, and sweet tea.  o Drink more water. Drink a glass when you feel hungry or before each meal.  o Eat breakfast - Complex carb and protein (likeDannon light and fit yogurt, oatmeal, fruit, eggs, [place] bacon). o Measure your cereal.  Eat no more than one cup a day. (ie [place]) o Eat an apple a day. o Add a vegetable a day. o Try a new vegetable a month. o Use Pam! Stop using oil or butter to cook. o Don't finish your plate or use smaller plates. o Share your dessert. o Eat sugar free Jello  for dessert or frozen grapes. o Don't eat 2-3 hours before bed. o Switch to whole wheat bread, pasta, and brown rice. o Make healthier choices when you eat out. No fries! o Pick baked chicken, NOT fried. o Don't forget to SLOW DOWN when you eat. It is not going anywhere.  o Take the stairs. o Park far away in the parking lot o [employer] (or weights) for 10 minutes while watching TV. o Walk at work for 10 minutes during break. o Walk outside 1 time a week with your friend, kids, dog, or significant other. o Start a walking group at [place] the mall as much as you can tolerate.  o Keep a food diary. o Weigh yourself daily. o Walk for 15 minutes 3 days per week. o Cook at home more often and eat out less.  If life happens and you go back to old habits, it is okay.  Just start over. You can do it!   If you experience chest pain, get short of breath, or tired during  the exercise, please stop immediately and inform your doctor.

## 2019-01-04 LAB — CBC WITH DIFFERENTIAL/PLATELET
Absolute Monocytes: 403 cells/uL (ref 200–950)
Basophils Absolute: 49 cells/uL (ref 0–200)
Basophils Relative: 0.8 %
Eosinophils Absolute: 140 cells/uL (ref 15–500)
Eosinophils Relative: 2.3 %
HCT: 40.6 % (ref 35.0–45.0)
Hemoglobin: 13.4 g/dL (ref 11.7–15.5)
Lymphs Abs: 1464 cells/uL (ref 850–3900)
MCH: 28.1 pg (ref 27.0–33.0)
MCHC: 33 g/dL (ref 32.0–36.0)
MCV: 85.1 fL (ref 80.0–100.0)
MPV: 12.3 fL (ref 7.5–12.5)
Monocytes Relative: 6.6 %
Neutro Abs: 4044 cells/uL (ref 1500–7800)
Neutrophils Relative %: 66.3 %
Platelets: 228 10*3/uL (ref 140–400)
RBC: 4.77 10*6/uL (ref 3.80–5.10)
RDW: 13 % (ref 11.0–15.0)
Total Lymphocyte: 24 %
WBC: 6.1 10*3/uL (ref 3.8–10.8)

## 2019-01-04 LAB — LIPID PANEL
Cholesterol: 132 mg/dL (ref <200)
HDL: 47 mg/dL — ABNORMAL LOW (ref >=50)
LDL Cholesterol (Calc): 56 mg/dL (calc)
Non-HDL Cholesterol (Calc): 85 mg/dL (calc) (ref <130)
Total CHOL/HDL Ratio: 2.8 (calc) (ref <5.0)
Triglycerides: 231 mg/dL — ABNORMAL HIGH (ref <150)

## 2019-01-04 LAB — MAGNESIUM: Magnesium: 1.8 mg/dL (ref 1.5–2.5)

## 2019-01-04 LAB — COMPLETE METABOLIC PANEL WITH GFR
AG Ratio: 2.1 (calc) (ref 1.0–2.5)
ALT: 21 U/L (ref 6–29)
AST: 18 U/L (ref 10–35)
Albumin: 4.6 g/dL (ref 3.6–5.1)
Alkaline phosphatase (APISO): 71 U/L (ref 37–153)
BUN: 12 mg/dL (ref 7–25)
CO2: 26 mmol/L (ref 20–32)
Calcium: 9.6 mg/dL (ref 8.6–10.4)
Chloride: 103 mmol/L (ref 98–110)
Creat: 0.69 mg/dL (ref 0.50–0.99)
GFR, Est African American: 104 mL/min/{1.73_m2} (ref >=60)
GFR, Est Non African American: 89 mL/min/{1.73_m2} (ref >=60)
Globulin: 2.2 g/dL (calc) (ref 1.9–3.7)
Glucose, Bld: 125 mg/dL — ABNORMAL HIGH (ref 65–99)
Potassium: 4.2 mmol/L (ref 3.5–5.3)
Sodium: 139 mmol/L (ref 135–146)
Total Bilirubin: 0.7 mg/dL (ref 0.2–1.2)
Total Protein: 6.8 g/dL (ref 6.1–8.1)

## 2019-01-04 LAB — HEMOGLOBIN A1C
Hgb A1c MFr Bld: 5.7 % of total Hgb — ABNORMAL HIGH (ref <5.7)
Mean Plasma Glucose: 117 (calc)
eAG (mmol/L): 6.5 (calc)

## 2019-01-04 LAB — TSH: TSH: 3.47 mIU/L (ref 0.40–4.50)

## 2019-04-12 ENCOUNTER — Ambulatory Visit: Payer: Self-pay | Admitting: Internal Medicine

## 2019-04-15 ENCOUNTER — Other Ambulatory Visit: Payer: Self-pay | Admitting: Internal Medicine

## 2019-04-15 MED ORDER — METFORMIN HCL ER 500 MG PO TB24: ORAL_TABLET | ORAL | 3 refills | Status: DC

## 2019-04-24 NOTE — Progress Notes (Deleted)
3 Month Follow Up   Assessment and Plan:   Diagnoses and all orders for this visit:  Essential hypertension  Hyperlipidemia associated with type 2 diabetes mellitus ([place])  Vitamin D deficiency  Morbid obesity (BMI 39)   CKD stage 2 due to type 2 diabetes mellitus ([place])  Type 2 diabetes mellitus with stage 2 chronic kidney disease, without long-term current use of insulin ([facility])  Medication management    Continue diet and meds as discussed. Further disposition pending results of labs. Discussed med's effects and SE's.  Patient agrees with plan of care and opportunity to ask questions/voice concerns. Over 30 minutes of chart review, interview, exam, counseling, and critical decision making was performed.   Future Appointments  Date Time Provider [place]  04/25/2019 11:00 AM Garnet Sierras, NP [facility] None  11/11/2019  3:00 PM [name], MD [facility] None    ----------------------------------------------------------------------------------------------------------------------  HPI 68 y.o. female  presents for 3 month follow up on HTN, HLD, Hypothyroidism, history of pre-diabetes, weight and vitamin D deficiency.   BMI is There is no height or weight on file to calculate BMI., she {HAS HAS [id number] been working on diet and exercise. Wt Readings from Last 3 Encounters:  01/03/19 220 lb (99.8 kg)  10/05/18 224 lb 2 oz (101.7 kg)  09/27/18 226 lb (102.5 kg)    HTN predates *** Her blood pressure {HAS HAS NOT:18834} been controlled at home, today their BP is    She {DOES_DOES [id number] workout. She denies any cardiac symptoms, chest pains, palpitations, shortness of breath, dizziness or lower extremity edema.     She {ACTION; IS/IS GI:087931 on cholesterol medication {Cholesterol meds:21887} and denies myalgias. Her cholesterol {ACTION; IS/IS NOT:21021397} at goal. The cholesterol last visit was:   Lab Results  Component Value Date   CHOL 132  01/03/2019   HDL 47 (L) 01/03/2019   LDLCALC 56 01/03/2019   TRIG 231 (H) 01/03/2019   CHOLHDL 2.8 01/03/2019    She {Has/has not:18111} been working on diet and exercise for prediabetes, and denies {Symptoms; diabetes w/o none:19199}. Last A1C in the office was:  Lab Results  Component Value Date   HGBA1C 5.7 (H) 01/03/2019   Patient is on Vitamin D supplement.   Lab Results  Component Value Date   VD25OH 96 09/27/2018       Current Medications:  Current Outpatient Medications on File Prior to Visit  Medication Sig  . aspirin EC 81 MG tablet Take 81 mg by mouth daily.  . bisoprolol-hydrochlorothiazide (ZIAC) 10-6.25 MG tablet TAKE 1 TABLET BY MOUTH DAILY (Patient taking differently: Take 1 tablet by mouth every morning. )  . Cholecalciferol (VITAMIN D-3) 5000 UNITS TABS Take 2 tablets by mouth every morning.   . gabapentin (NEURONTIN) 300 MG capsule TAKE ONE CAPSULE BY MOUTH THREE TIMES DAILY FOR SCIATICA PAIN (Patient not taking: Reported on 01/03/2019)  . losartan (COZAAR) 50 MG tablet TAKE 1 TABLET(50 MG) BY MOUTH DAILY  . metFORMIN (GLUCOPHAGE-XR) 500 MG 24 hr tablet Take 2 tablets 2 x /day with Meals for Diabetes  . rosuvastatin (CRESTOR) 40 MG tablet Take 1 tablet daily for Cholesterol (Patient taking differently: Take 20 mg by mouth every evening. Take 1/2 tablet daily for Cholesterol)   No current facility-administered medications on file prior to visit.     Allergies:  No Known Allergies   Medical History:  Past Medical History:  Diagnosis Date  . Arthritis    hands, feet  . Atherosclerosis of coronary artery of  native heart 09/2002   per cardiac cath 10-10-2002 mild non-obstructive of LAD  . CKD (chronic kidney disease), stage II   . Endometrial polyp   . Hemorrhoids   . History of cardiomyopathy in adulthood per cardiologist, dr Lia Foyer, note in epic , Takotsuku cardiomyopathy (resolved) and release by cardiology 2012;   10-01-2018 per pt denies any symptoms  past several years   per cath 10-10-2002  ef 25% with wall motion abnormalities;  follow-up cath 11-22-2004  resolution wall motion abnormalities and normal LVF;   echo 11-25-2010  ef 55%, mild LAE,  trivial MR, PR, TR  . HTN (hypertension)   . Hypercholesterolemia   . Right sided sciatica 12/12/2017  . Type 2 diabetes mellitus ([place])    followed by pcp    Family history- Reviewed and unchanged   Social history- Reviewed and unchanged   Names of Other Physician/Practitioners you currently use: 1. [facility] here for primary care 2. Eye Exam: *** 3. Dental Exam ***  Patient Care Team: [name], MD as PCP - General (Internal Medicine) Melissa Noon, [place] as Referring Physician (Optometry)   Screening Tests: Immunization History  Administered Date(s) Administered  . Influenza, High Dose Seasonal PF 04/30/2015, 05/23/2017  . Influenza-Unspecified 04/15/2013, 05/06/2014  . Pneumococcal Conjugate-13 11/01/2016  . Pneumococcal Polysaccharide-23 04/30/2015  . Tdap 03/11/2014  . Zoster 05/23/2012     Vaccinations: TD or Tdap: 2015  Influenza: 2019, due for 2020 Pneumococcal: 2016 Prevnar13: 2018 Shingles: Zostavax/Shingrix: DUE?   Preventative Care: Last colonoscopy: 2016 Last mammogram:06/2018, Due for 2020 Follows with Dr Phineas Real for women's health endometrial polyp, D&C 09/2018, no further follow up recommended. Last pap smear/pelvic exam: 06/2018   DEXA:01/2018 Hep C screening [id number]), due?   Imaging: Chest X-ray: 02/2014 EKG: 09/2018 ECHO: 11/2010    Review of Systems:  ROS    Physical Exam: There were no vitals taken for this visit. Wt Readings from Last 3 Encounters:  01/03/19 220 lb (99.8 kg)  10/05/18 224 lb 2 oz (101.7 kg)  09/27/18 226 lb (102.5 kg)   General Appearance: Well nourished, in no apparent distress. Eyes: PERRLA, EOMs, conjunctiva no swelling or erythema Sinuses: No Frontal/maxillary  tenderness ENT/Mouth: Ext aud canals clear, TMs without erythema, bulging. No erythema, swelling, or exudate on post pharynx.  Tonsils not swollen or erythematous. Hearing normal.  Neck: Supple, thyroid normal.  Respiratory: Respiratory effort normal, BS equal bilaterally without rales, rhonchi, wheezing or stridor.  Cardio: RRR with no MRGs. Brisk peripheral pulses without edema.  Abdomen: Soft, + BS.  Non tender, no guarding, rebound, hernias, masses. Lymphatics: Non tender without lymphadenopathy.  Musculoskeletal: Full ROM, 5/5 strength, {PSY - GAIT AND STATION:22860} gait Skin: Warm, dry without rashes, lesions, ecchymosis.  Neuro: Cranial nerves intact. No cerebellar symptoms.  Psych: Awake and oriented X 3, normal affect, Insight and Judgment appropriate.    Garnet Sierras, NP [facility] 11:16 PM

## 2019-04-25 ENCOUNTER — Ambulatory Visit: Payer: Self-pay | Admitting: Internal Medicine

## 2019-04-25 ENCOUNTER — Other Ambulatory Visit: Payer: Self-pay

## 2019-04-25 ENCOUNTER — Encounter: Payer: Self-pay | Admitting: Adult Health

## 2019-04-25 ENCOUNTER — Ambulatory Visit: Payer: Self-pay | Admitting: Adult Health Nurse Practitioner

## 2019-04-25 ENCOUNTER — Ambulatory Visit: Payer: Medicare Other | Admitting: Adult Health

## 2019-04-25 VITALS — BP 126/78 | HR 70 | Temp 96.8°F | Ht 62.0 in | Wt 217.0 lb

## 2019-04-25 DIAGNOSIS — E1122 Type 2 diabetes mellitus with diabetic chronic kidney disease: Secondary | ICD-10-CM | POA: Diagnosis not present

## 2019-04-25 DIAGNOSIS — E1169 Type 2 diabetes mellitus with other specified complication: Secondary | ICD-10-CM

## 2019-04-25 DIAGNOSIS — Z79899 Other long term (current) drug therapy: Secondary | ICD-10-CM

## 2019-04-25 DIAGNOSIS — E559 Vitamin D deficiency, unspecified: Secondary | ICD-10-CM

## 2019-04-25 DIAGNOSIS — N182 Chronic kidney disease, stage 2 (mild): Secondary | ICD-10-CM

## 2019-04-25 DIAGNOSIS — E785 Hyperlipidemia, unspecified: Secondary | ICD-10-CM

## 2019-04-25 DIAGNOSIS — I10 Essential (primary) hypertension: Secondary | ICD-10-CM | POA: Diagnosis not present

## 2019-04-25 NOTE — Addendum Note (Signed)
Addended by: Izora Ribas on: 04/25/2019 04:11 PM   Modules accepted: Orders

## 2019-04-25 NOTE — Progress Notes (Signed)
FOLLOW UP  Assessment and Plan:   Takotsubo syndrome No symptoms for over 5 years Has been released by cardiology  Stress-induced cardiomyopathy  Has been released by cardiology; monitor   Essential hypertension Continue medications Monitor blood pressure at home; call if consistently over 130/80 Continue DASH diet.   Reminder to go to the ER if any CP, SOB, nausea, dizziness, severe HA, changes vision/speech, left arm numbness and tingling and jaw pain.  Type 2 diabetes mellitus with stage 2 chronic kidney disease, without long-term current use of insulin [facility]) Education: Reviewed 'ABCs' of diabetes management (respective goals in parentheses):  A1C (<7), blood pressure (<130/80), and cholesterol (LDL <70) Eye Exam yearly and Dental Exam every 6 months- up to date metformin 4 tabs daily  Dietary recommendations Physical Activity recommendations Foot exam up to date   DJD Managed by OTC analgesics Followed by [facility] Dr. Gladstone Lighter, Dr. Veverly Fells Weight loss advised  Vitamin D deficiency Continue supplementation Check vitamin D level q 6 months or as needed  Morbid obesity (BMI 40)  Long discussion about weight loss, diet, and exercise Recommended diet heavy in fruits and veggies and low in animal meats, cheeses, and dairy products, appropriate calorie intake Discussed appropriate weight for height, she would prefer not to set weight goal, working on general good habits with goal to normalize A1C and get off meds Follow up at next visit  Medication management CBC, CMP/GFR, magnesium   Hyperlipidemia Continue medications Continue low cholesterol diet and exercise.  Check lipid panel.   Gastroesophageal reflux disease, esophagitis presence not specified Well managed on current medications Discussed diet, avoiding triggers and other lifestyle changes  Murmur  Mild systolic murmur; per patient has had intermittently for many years; monitor Trivial MVR per echo  2012  Continue diet and meds as discussed. Further disposition pending results of labs. Discussed med's effects and SE's.   Over 30 minutes of exam, counseling, chart review, and critical decision making was performed.   Future Appointments  Date Time Provider [place]  11/11/2019  3:00 PM [name], MD [facility] None    ----------------------------------------------------------------------------------------------------------------------  HPI 68 y.o. female  presents for 3 month follow up on hypertension, cholesterol, diabetes, weight and vitamin D deficiency.   She had R sided sciatica but has spontaneously resolved after recent D&C performed for endometrial polyps due to postmenopausal bleeding, no longer needing gabapentin.   she has a diagnosis of GERD which has remained asymptomatic off of medications for over 1 year.   BMI is Body mass index is 39.69 kg/m., she has been eating out less, doing more salads. She is somewhat acitve at work, up and down all day. Takes a walk once weekly. 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 Has been released by cardiology.  Her blood pressure has been controlled at home, today their BP is BP: 126/78  She does not workout. She denies chest pain, shortness of breath, dizziness.   She is on cholesterol medication Rosuvastatin 20 mg daily and denies myalgias. Her cholesterol is at goal. The cholesterol last visit was:   Lab Results  Component Value Date  CHOL 132 01/03/2019   HDL 47 (L) 01/03/2019   LDLCALC 56 01/03/2019   TRIG 231 (H) 01/03/2019   CHOLHDL 2.8 01/03/2019    She has been working on diet and exercise for T2  diabetes well controlled on metformin 2000 mg, and denies nausea, paresthesia of the feet, polydipsia, polyuria, visual disturbances and vomiting. She does have glucometer but not checking due to recent excellent control. Last A1C in the office was:  Lab Results  Component Value Date   HGBA1C 5.7 (H) 01/03/2019   Lab Results  Component Value Date   GFRNONAA 89 01/03/2019   Patient is on Vitamin D supplement.   Lab Results  Component Value Date   VD25OH 96 09/27/2018         Current Medications:  Current Outpatient Medications on File Prior to Visit  Medication Sig  . aspirin EC 81 MG tablet Take 81 mg by mouth daily.  . bisoprolol-hydrochlorothiazide (ZIAC) 10-6.25 MG tablet TAKE 1 TABLET BY MOUTH DAILY (Patient taking differently: Take 1 tablet by mouth every morning. )  . Cholecalciferol (VITAMIN D-3) 5000 UNITS TABS Take 2 tablets by mouth every morning.   Marland Kitchen losartan (COZAAR) 50 MG tablet TAKE 1 TABLET(50 MG) BY MOUTH DAILY  . metFORMIN (GLUCOPHAGE-XR) 500 MG 24 hr tablet Take 2 tablets 2 x /day with Meals for Diabetes  . rosuvastatin (CRESTOR) 40 MG tablet Take 1 tablet daily for Cholesterol (Patient taking differently: Take 20 mg by mouth every evening. Take 1/2 tablet daily for Cholesterol)  . gabapentin (NEURONTIN) 300 MG capsule TAKE ONE CAPSULE BY MOUTH THREE TIMES DAILY FOR SCIATICA PAIN (Patient not taking: Reported on 01/03/2019)   No current facility-administered medications on file prior to visit.      Allergies: No Known Allergies   Medical History:  Past Medical History:  Diagnosis Date  . Arthritis    hands, feet  . Atherosclerosis of coronary artery of native heart 09/2002   per cardiac cath 10-10-2002 mild non-obstructive of LAD  . CKD (chronic kidney disease), stage II   . Endometrial polyp   . Hemorrhoids   . History of cardiomyopathy in adulthood per cardiologist, dr Lia Foyer, note in epic , Takotsuku cardiomyopathy (resolved) and release by cardiology  2012;   10-01-2018 per pt denies any symptoms past several years   per cath 10-10-2002  ef 25% with wall motion abnormalities;  follow-up cath 11-22-2004  resolution wall motion abnormalities and normal LVF;   echo 11-25-2010  ef 55%, mild LAE,  trivial MR, PR, TR  . HTN (hypertension)   . Hypercholesterolemia   . Right sided sciatica 12/12/2017  . Type 2 diabetes mellitus ([place])    followed by pcp   Family history- Reviewed and unchanged Social history- Reviewed and unchanged   Review of Systems:  Review of Systems  Constitutional: Negative for malaise/fatigue and weight loss.  HENT: Negative for hearing loss and tinnitus.   Eyes: Negative for blurred vision and double vision.  Respiratory: Negative for cough, shortness of breath and wheezing.   Cardiovascular: Negative for chest pain, palpitations, orthopnea, claudication and leg swelling.  Gastrointestinal: Negative for abdominal pain, blood in stool, constipation, diarrhea, heartburn, melena, nausea and vomiting.  Genitourinary: Negative.   Musculoskeletal: Negative for joint pain and myalgias.  Skin: Negative for rash.  Neurological: Negative for dizziness, tingling, sensory change, weakness and headaches.  Endo/Heme/Allergies: Negative for polydipsia.  Psychiatric/Behavioral: Negative.   All other systems reviewed and are negative.     Physical Exam:  BP 126/78   Pulse 70   Temp (!) 96.8 F (36 C)   Ht 5\' 2"  (1.575 m)   Wt 217 lb (98.4 kg)   SpO2 98%   BMI 39.69 kg/m  Wt Readings from Last 3 Encounters:  04/25/19 217 lb (98.4 kg)  01/03/19 220 lb (99.8 kg)  10/05/18 224 lb 2 oz (101.7 kg)   General Appearance: Well nourished, in no apparent distress. Eyes: PERRLA, EOMs, conjunctiva no swelling or erythema Sinuses: No Frontal/maxillary tenderness ENT/Mouth: Ext aud canals clear, TMs without erythema, bulging. No erythema, swelling, or exudate on post pharynx.  Tonsils not swollen or erythematous. Hearing normal.   Neck: Supple, thyroid normal.  Respiratory: Respiratory effort normal, BS equal bilaterally without rales, rhonchi, wheezing or stridor.  Cardio: RRR with mild 2/6 early systolic murmur without radiation, no gallop, click. Brisk peripheral pulses without edema.   Abdomen: Soft, + BS.  Non tender, no guarding, rebound, hernias, masses. Lymphatics: Non tender without lymphadenopathy.  Musculoskeletal: Full ROM, 5/5 strength, Normal gait Skin: Warm, dry without rashes, lesions, ecchymosis.  Neuro: Cranial nerves intact. No cerebellar symptoms.  Psych: Awake and oriented X 3, normal affect, Insight and Judgment appropriate.    Izora Ribas, NP 3:34 PM [facility]

## 2019-04-25 NOTE — Patient Instructions (Signed)
Goals    . Exercise 5x per week (15-20 min per time)     Moderate intensity - enough to get heart rate up and be unable to speak in complete sentences    . HEMOGLOBIN A1C < 5.7         Heart Murmur A heart murmur is an extra sound that is caused by chaotic blood flow through the valves of the heart. The murmur can be heard as a "hum" or "whoosh" sound when blood flows through the heart. There are two types of heart murmurs:  Innocent (benign) murmurs. Most people with this type of heart murmur do not have a heart problem. Many children have innocent heart murmurs. Your health care provider may suggest some basic tests to find out whether your murmur is an innocent murmur. If an innocent heart murmur is found, there is no need for further tests or treatment and no need to restrict activities or stop playing sports.  Abnormal murmurs. These types of murmurs can occur in children and adults. Abnormal murmurs may be a sign of a more serious heart condition, such as a heart defect present at birth (congenital defect) or heart valve disease. What are the causes?  The heart has four areas called chambers. Valves separate the upper and lower chambers from each other (tricuspid valve and mitral valve) and separate the lower chambers of the heart from pathways that lead away from the heart (aortic valve and pulmonary valve). Normally, the valves open to let blood flow through or out of your heart, and then they shut to keep the blood from flowing backward. This condition is caused by heart valves that are not working properly.  In children, abnormal heart murmurs are typically caused by congenital defects.  In adults, abnormal murmurs are usually caused by heart valve problems from disease, infection, or aging. This condition may also be caused by:  Pregnancy.  Fever.  Overactive thyroid gland.  Anemia.  Exercise.  Rapid growth spurts (in children). What are the signs or symptoms?  Innocent murmurs do not cause symptoms, and many people with abnormal murmurs may not have symptoms. If symptoms do develop, they may include:  Shortness of breath.  Blue coloring of the skin, especially on the fingertips.  Chest pain.  Palpitations, or feeling a fluttering or skipped heartbeat.  Fainting.  Persistent cough.  Getting tired much faster than expected.  Swelling in the abdomen, feet, or ankles. How is this diagnosed? This condition may be diagnosed during a routine physical or other exam. If your health care provider hears a murmur with a stethoscope, he or she will listen for:  Where the murmur is located in your heart.  How long the murmur lasts (duration).  When the murmur is heard during the heartbeat.  How loud the murmur is. This may help the health care provider figure out what is causing the murmur. You may be referred to a heart specialist (cardiologist). You may also have other tests, including:  Electrocardiogram (ECG or EKG). This test measures the electrical activity of your heart.  Echocardiogram. This test uses high frequency sound waves to make pictures of your heart.  MRI or chest X-ray.  Cardiac catheterization. This test looks at blood flow through the arteries around the heart. For children and adults who have an abnormal heart murmur and want to stay active, it is important to:  Complete testing.  Review test results.  Receive recommendations from your health care provider. If heart disease is  present, it may not be safe to play or be active. How is this treated? Heart murmurs themselves do not need treatment. In some cases, a heart murmur may go away on its own. If an underlying problem or disease is causing the murmur, you may need treatment. If treatment is needed, it will depend on the type and severity of the disease or heart problem causing the murmur. Treatment may include:  Medicine.  Surgery.  Dietary and lifestyle  changes. Follow these instructions at home:  Talk with your health care provider before participating in sports or other activities that require a lot of effort and energy (are strenuous).  Learn as much as possible about your condition and any related diseases. Ask your health care provider if you may be at risk for any medical emergencies.  Talk with your health care provider about what symptoms you should look out for.  It is up to you to get your test results. Ask your health care provider, or the department that is doing the test, when your results will be ready.  Keep all follow-up visits as told by your health care provider. This is important. Contact a health care provider if:  You are frequently short of breath.  You feel more tired than usual.  You are having a hard time keeping up with normal activities or fitness routines.  You have swelling in your ankles or feet.  You notice that your heart often beats irregularly.  You develop any new symptoms. Get help right away if:  You have chest pain.  You are having trouble breathing.  You feel light-headed or you pass out.  Your symptoms suddenly get worse. These symptoms may represent a serious problem that is an emergency. Do not wait to see if the symptoms will go away. Get medical help right away. Call your local emergency services (911 in the [place] Do not drive yourself to the hospital. Summary  Normally, the heart valves open to let blood flow through or out of your heart, and then they shut to keep the blood from flowing backward.  A heart murmur is caused by heart valves that are not working properly.  You may need treatment if an underlying problem or disease is causing the heart murmur. Treatment may include medicine, surgery, or dietary and lifestyle changes.  Talk with your health care provider before participating in sports or other activities that require a lot of effort and energy (are strenuous).   Talk with your health care provider about what symptoms you should watch out for. This information is not intended to replace advice given to you by your health care provider. Make sure you discuss any questions you have with your health care provider. Document Released: 09/08/2004 Document Revised: 01/23/2018 Document Reviewed: 01/23/2018 Elsevier Patient Education  2020 [employer].

## 2019-04-26 LAB — COMPLETE METABOLIC PANEL WITH GFR
AG Ratio: 2.3 (calc) (ref 1.0–2.5)
ALT: 20 U/L (ref 6–29)
AST: 17 U/L (ref 10–35)
Albumin: 4.5 g/dL (ref 3.6–5.1)
Alkaline phosphatase (APISO): 65 U/L (ref 37–153)
BUN: 14 mg/dL (ref 7–25)
CO2: 28 mmol/L (ref 20–32)
Calcium: 9.7 mg/dL (ref 8.6–10.4)
Chloride: 104 mmol/L (ref 98–110)
Creat: 0.72 mg/dL (ref 0.50–0.99)
GFR, Est African American: 99 mL/min/{1.73_m2} (ref >=60)
GFR, Est Non African American: 85 mL/min/{1.73_m2} (ref >=60)
Globulin: 2 g/dL (calc) (ref 1.9–3.7)
Glucose, Bld: 156 mg/dL — ABNORMAL HIGH (ref 65–99)
Potassium: 3.7 mmol/L (ref 3.5–5.3)
Sodium: 142 mmol/L (ref 135–146)
Total Bilirubin: 0.7 mg/dL (ref 0.2–1.2)
Total Protein: 6.5 g/dL (ref 6.1–8.1)

## 2019-04-26 LAB — CBC WITH DIFFERENTIAL/PLATELET
Absolute Monocytes: 372 cells/uL (ref 200–950)
Basophils Absolute: 32 cells/uL (ref 0–200)
Basophils Relative: 0.5 %
Eosinophils Absolute: 139 cells/uL (ref 15–500)
Eosinophils Relative: 2.2 %
HCT: 39.2 % (ref 35.0–45.0)
Hemoglobin: 12.8 g/dL (ref 11.7–15.5)
Lymphs Abs: 1733 cells/uL (ref 850–3900)
MCH: 27.9 pg (ref 27.0–33.0)
MCHC: 32.7 g/dL (ref 32.0–36.0)
MCV: 85.4 fL (ref 80.0–100.0)
MPV: 12.4 fL (ref 7.5–12.5)
Monocytes Relative: 5.9 %
Neutro Abs: 4026 cells/uL (ref 1500–7800)
Neutrophils Relative %: 63.9 %
Platelets: 217 10*3/uL (ref 140–400)
RBC: 4.59 10*6/uL (ref 3.80–5.10)
RDW: 13.6 % (ref 11.0–15.0)
Total Lymphocyte: 27.5 %
WBC: 6.3 10*3/uL (ref 3.8–10.8)

## 2019-04-26 LAB — HEMOGLOBIN A1C
Hgb A1c MFr Bld: 5.8 % of total Hgb — ABNORMAL HIGH (ref <5.7)
Mean Plasma Glucose: 120 (calc)
eAG (mmol/L): 6.6 (calc)

## 2019-04-26 LAB — LIPID PANEL
Cholesterol: 126 mg/dL (ref <200)
HDL: 46 mg/dL — ABNORMAL LOW (ref >=50)
LDL Cholesterol (Calc): 51 mg/dL (calc)
Non-HDL Cholesterol (Calc): 80 mg/dL (calc) (ref <130)
Total CHOL/HDL Ratio: 2.7 (calc) (ref <5.0)
Triglycerides: 218 mg/dL — ABNORMAL HIGH (ref <150)

## 2019-04-26 LAB — TSH: TSH: 2.71 mIU/L (ref 0.40–4.50)

## 2019-04-26 LAB — MAGNESIUM: Magnesium: 1.8 mg/dL (ref 1.5–2.5)

## 2019-05-22 ENCOUNTER — Encounter: Payer: Self-pay | Admitting: Gynecology

## 2019-07-26 IMAGING — US US PELVIS COMPLETE TRANSABD/TRANSVAG
1 series · 13 of 25 positions shown · non-contrast
Comparison: None

CLINICAL DATA: Initial evaluation for postmenopausal bleeding.



[Series 1: us pelvis complete transabd/transvag · 0.23mm/px · 13 of 49 slices shown]
[im 1/49]
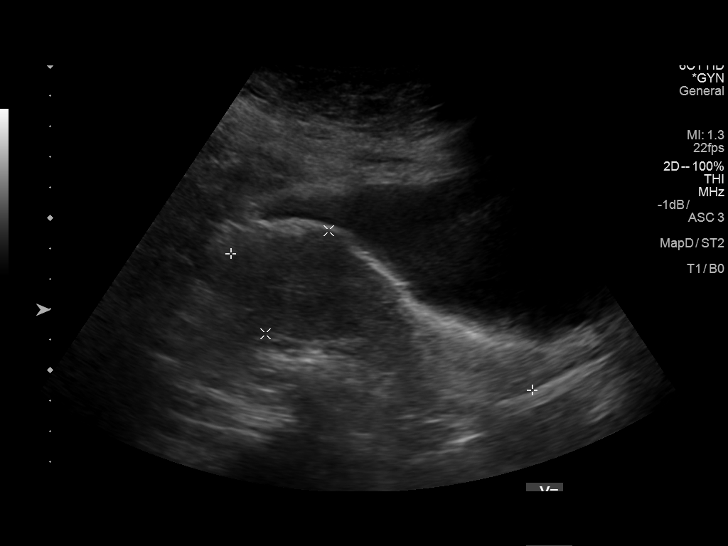
[im 5/49]
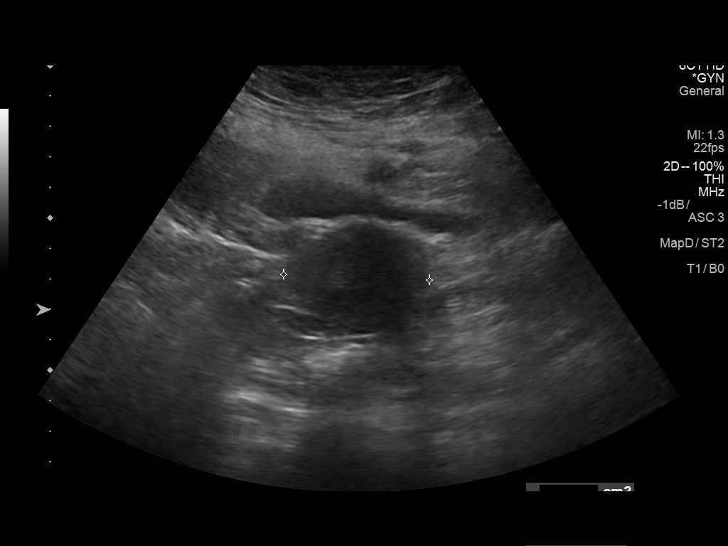
[im 9/49]
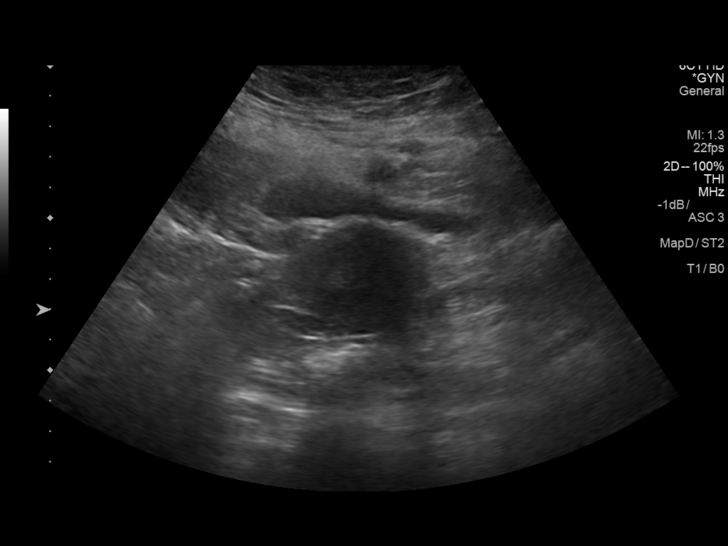
[im 13/49]
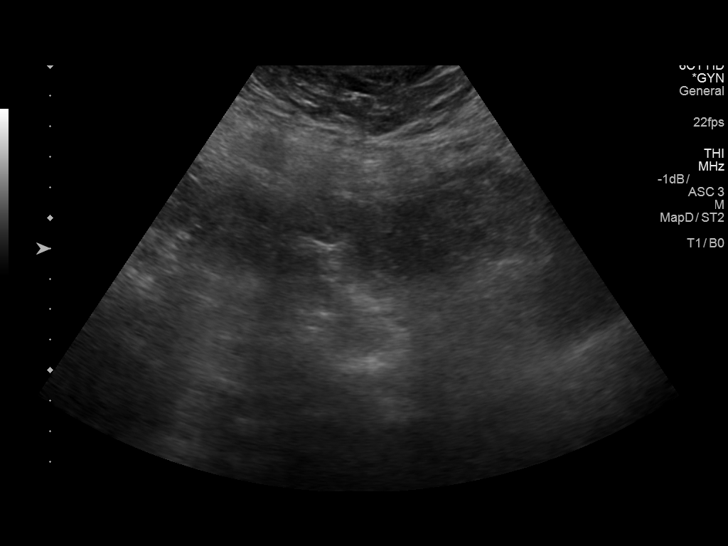
[im 17/49]
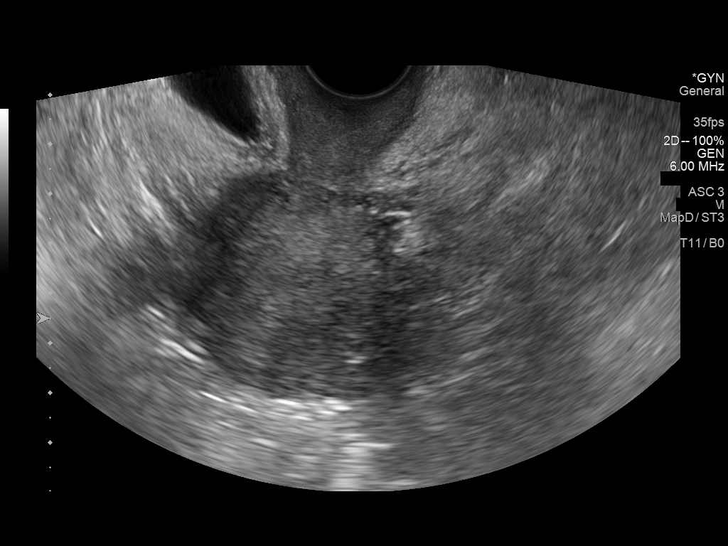
[im 21/49]
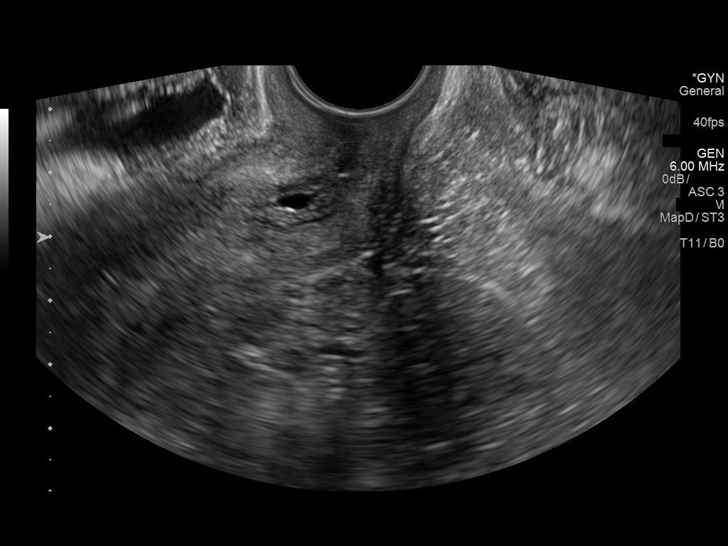
[im 25/49]
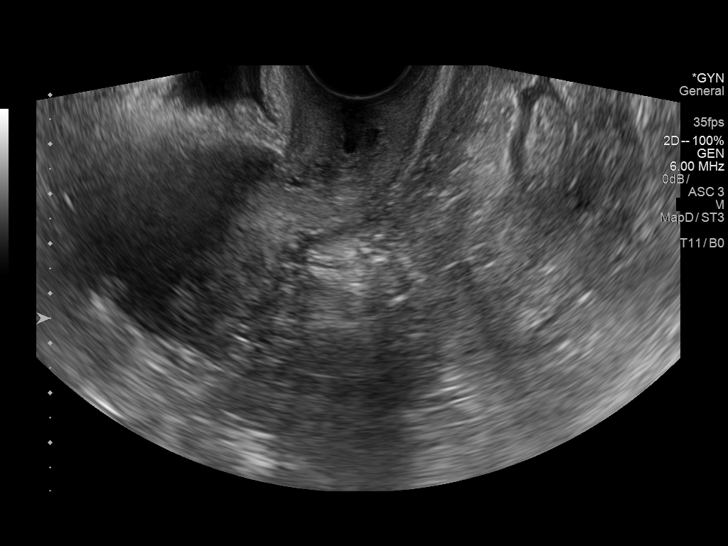
[im 29/49]
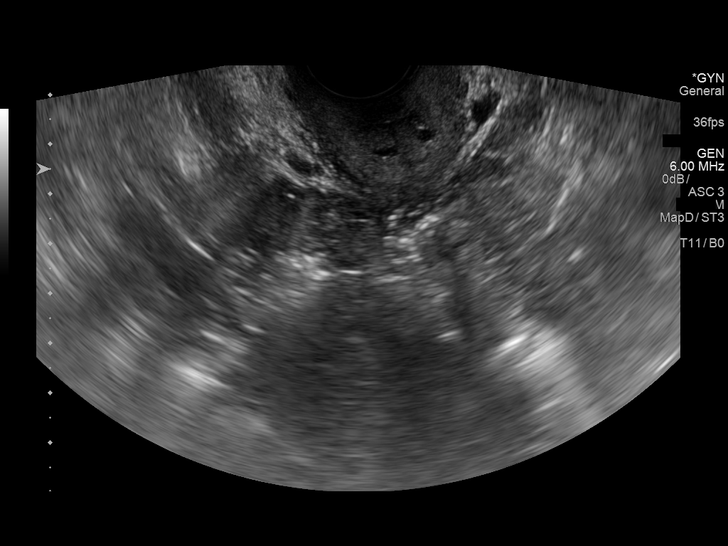
[im 33/49]
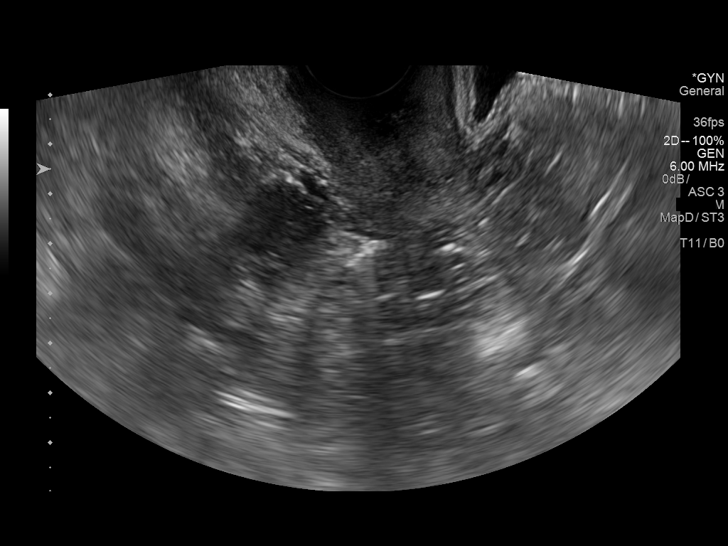
[im 37/49]
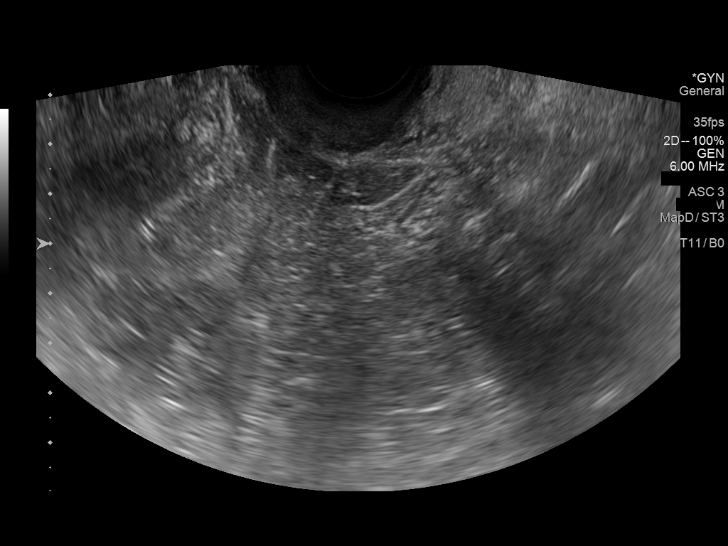
[im 41/49]
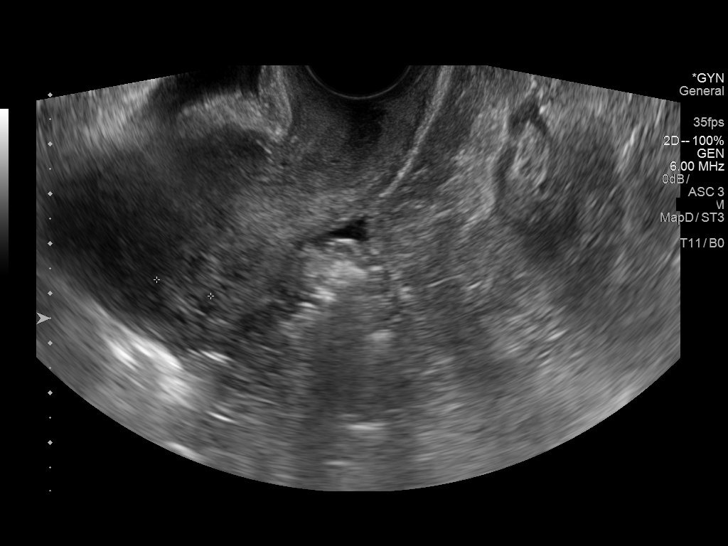
[im 45/49]
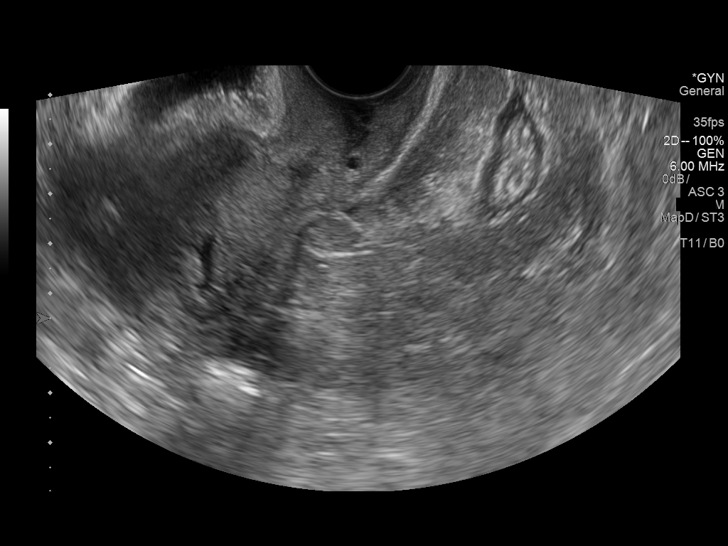
[im 49/49]
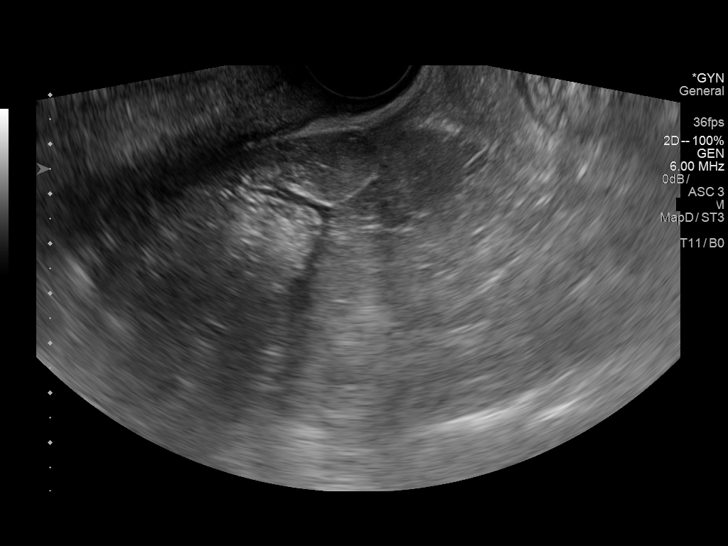

[13 of 25 positions shown; findings below may reference images not displayed]

FINDINGS: Uterus

Measurements: 10.9 x 4.0 x 4.8 cm = volume: 108.1 mL. No fibroids or
other mass visualized.

Endometrium

Thickness: 9.3 mm. No focal abnormality visualized. Small amount of
free anechoic fluid noted within the endocervical canal.

Right ovary

Measurements: 1.6 x 1.0 x 1.7 cm = volume: 1.3 mL. Normal
appearance/no adnexal mass.

Left ovary

Not visualized.  No adnexal mass.

Other findings

Trace free fluid noted within the pelvis.
IMPRESSION: 1. Endometrial stripe measures up to 9.3 mm in thickness. In the
setting of post-menopausal bleeding, endometrial sampling is
indicated to exclude carcinoma. If results are benign,
sonohysterogram should be considered for focal lesion work-up. (Ref:
Radiological Reasoning: Algorithmic Workup of Abnormal Vaginal
Bleeding with Endovaginal Sonography and Sonohysterography. AJR
3661; 191:S68-73).
2. Otherwise normal sonographic appearance of the uterus.
3. Nonvisualization of the left ovary.  No adnexal mass.
4. Normal right ovary.

## 2019-07-31 NOTE — Progress Notes (Signed)
FOLLOW UP  Assessment and Plan:   Takotsubo syndrome No symptoms for over 5 years Has been released by cardiology  Stress-induced cardiomyopathy  Has been released by cardiology; monitor   Essential hypertension Continue medications Monitor blood pressure at home; call if consistently over 130/80 Continue DASH diet.   Reminder to go to the ER if any CP, SOB, nausea, dizziness, severe HA, changes vision/speech, left arm numbness and tingling and jaw pain.  Type 2 diabetes mellitus with stage 2 chronic kidney disease, without long-term current use of insulin [facility]) Education: Reviewed 'ABCs' of diabetes management (respective goals in parentheses):  A1C (<7), blood pressure (<130/80), and cholesterol (LDL <70) Eye Exam yearly and Dental Exam every 6 months metformin 4 tabs daily  Dietary recommendations Physical Activity recommendations  Vitamin D deficiency Continue supplementation Check vitamin D level q 6 months or as needed  Morbid obesity ([place]) - BMI 40 with T2DM, htn, hyperlipidemia Long discussion about weight loss, diet, and exercise Recommended diet heavy in fruits and veggies and low in animal meats, cheeses, and dairy products, appropriate calorie intake Discussed appropriate weight for height, weight goal of <200 lb set goal to normalize A1C and get off meds Follow up at next visit  Medication management CBC, CMP/GFR, magnesium   Hyperlipidemia Continue medications Continue low cholesterol diet and exercise.  Check lipid panel.   Gastroesophageal reflux disease, esophagitis presence not specified Well managed on current medications Discussed diet, avoiding triggers and other lifestyle changes  Murmur  Mild systolic murmur; per patient has had intermittently for many years; monitor Trivial MVR per echo 2012  Continue diet and meds as discussed. Further disposition pending results of labs. Discussed med's effects and SE's.   Over 30 minutes of exam,  counseling, chart review, and critical decision making was performed.   Future Appointments  Date Time Provider Novelty  11/11/2019  3:00 PM [name], MD [facility] None    ----------------------------------------------------------------------------------------------------------------------  HPI 68 y.o. female  presents for 3 month follow up on hypertension, cholesterol, diabetes, weight and vitamin D deficiency.   she has a diagnosis of GERD which has remained asymptomatic off of medications for over 1 year.   BMI is Body mass index is 40.02 kg/m., she has been eating out less, doing more salads. She is somewhat acitve at work, up and down all day. Takes a walk once weekly. She is down to rare diet soda, pushing unsweet tea and water (65 ounces/day). She plans to cut out sweets after the holidays.  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 Has been released by cardiology.  Her blood pressure has been controlled at home, today their BP is BP: 140/80  She does not workout. She denies chest pain, shortness of breath, dizziness.   She is on cholesterol medication Rosuvastatin 20 mg daily and denies myalgias. Her cholesterol is at goal. The cholesterol last visit was:   Lab Results  Component Value Date   CHOL 126 04/25/2019   HDL 46 (L) 04/25/2019   LDLCALC 51 04/25/2019   TRIG 218 (H) 04/25/2019   CHOLHDL 2.7 04/25/2019    She has been working on diet and exercise for T2 diabetes well controlled on metformin 2000  mg, and denies nausea, paresthesia of the feet, polydipsia, polyuria, visual disturbances and vomiting. She does have glucometer but not checking due to recent excellent control. Last A1C in the office was:  Lab Results  Component  Value Date   HGBA1C 5.8 (H) 04/25/2019   She has stable CKD II asscoiated with T2DM and htn monitored via this office:  Lab Results  Component Value Date   GFRNONAA 85 04/25/2019   Patient is on Vitamin D supplement.   Lab Results  Component Value Date   VD25OH 96 09/27/2018        Current Medications:  Current Outpatient Medications on File Prior to Visit  Medication Sig  . aspirin EC 81 MG tablet Take 81 mg by mouth daily.  . bisoprolol-hydrochlorothiazide (ZIAC) 10-6.25 MG tablet TAKE 1 TABLET BY MOUTH DAILY (Patient taking differently: Take 1 tablet by mouth every morning. )  . Cholecalciferol (VITAMIN D-3) 5000 UNITS TABS Take 2 tablets by mouth every morning.   Marland Kitchen losartan (COZAAR) 50 MG tablet TAKE 1 TABLET(50 MG) BY MOUTH DAILY  . metFORMIN (GLUCOPHAGE-XR) 500 MG 24 hr tablet Take 2 tablets 2 x /day with Meals for Diabetes  . rosuvastatin (CRESTOR) 40 MG tablet Take 1 tablet daily for Cholesterol (Patient taking differently: Take 20 mg by mouth every evening. Take 1/2 tablet daily for Cholesterol)   No current facility-administered medications on file prior to visit.     Allergies: No Known Allergies   Medical History:  Past Medical History:  Diagnosis Date  . Arthritis    hands, feet  . Atherosclerosis of coronary artery of native heart 09/2002   per cardiac cath 10-10-2002 mild non-obstructive of LAD  . CKD (chronic kidney disease), stage II   . Endometrial polyp   . Hemorrhoids   . History of cardiomyopathy in adulthood per cardiologist, dr Lia Foyer, note in epic , Takotsuku cardiomyopathy (resolved) and release by cardiology 2012;   10-01-2018 per pt denies any symptoms past several years   per cath 10-10-2002  ef 25% with wall motion abnormalities;  follow-up cath 11-22-2004  resolution wall motion abnormalities and normal LVF;   echo 11-25-2010  ef 55%, mild LAE,  trivial MR, PR, TR  . HTN (hypertension)   . Hypercholesterolemia   . Right sided sciatica  12/12/2017  . Type 2 diabetes mellitus ([place])    followed by pcp   Family history- Reviewed and unchanged Social history- Reviewed and unchanged   Review of Systems:  Review of Systems  Constitutional: Negative for malaise/fatigue and weight loss.  HENT: Negative for hearing loss and tinnitus.   Eyes: Negative for blurred vision and double vision.  Respiratory: Negative for cough, shortness of breath and wheezing.   Cardiovascular: Negative for chest pain, palpitations, orthopnea, claudication and leg swelling.  Gastrointestinal: Negative for abdominal pain, blood in stool, constipation, diarrhea, heartburn, melena, nausea and vomiting.  Genitourinary: Negative.   Musculoskeletal: Negative for joint pain and myalgias.  Skin: Negative for rash.  Neurological: Negative for dizziness, tingling, sensory change, weakness and headaches.  Endo/Heme/Allergies: Negative for polydipsia.  Psychiatric/Behavioral: Negative.   All other systems reviewed and are negative.     Physical Exam: BP 140/80   Pulse 62   Temp (!) 96.1 F (35.6 C)   Ht 5\' 2"  (1.575 m)   Wt 218 lb 12.8 oz (99.2 kg)   SpO2 99%   BMI 40.02 kg/m  Wt Readings from Last 3 Encounters:  08/01/19 218 lb 12.8 oz (99.2 kg)  04/25/19 217 lb (98.4 kg)  01/03/19 220 lb (99.8 kg)   General Appearance: Well nourished, in no apparent distress. Eyes: PERRLA, EOMs, conjunctiva no swelling or erythema Sinuses: No Frontal/maxillary tenderness ENT/Mouth: Ext aud canals clear, TMs without erythema, bulging. Mask in place due to pandemic, no concerns, oral exam deferred. Hearing normal.  Neck: Supple, thyroid normal.  Respiratory: Respiratory effort normal, BS equal bilaterally without rales, rhonchi, wheezing or stridor.  Cardio: RRR with mild 2/6 early systolic murmur without radiation, no gallop, click. Brisk peripheral pulses without edema.   Abdomen: Soft, + BS.  Non tender, no guarding, rebound, hernias, masses. Lymphatics:  Non tender without lymphadenopathy.  Musculoskeletal: Full ROM, 5/5 strength, Normal gait Skin: Warm, dry without rashes, lesions, ecchymosis.  Neuro: Cranial nerves intact. No cerebellar symptoms.  Psych: Awake and oriented X 3, normal affect, Insight and Judgment appropriate.    Izora Ribas, NP 8:51 AM [facility]

## 2019-08-01 ENCOUNTER — Ambulatory Visit: Payer: Medicare Other | Admitting: Adult Health

## 2019-08-01 ENCOUNTER — Encounter: Payer: Self-pay | Admitting: Adult Health

## 2019-08-01 ENCOUNTER — Other Ambulatory Visit: Payer: Self-pay

## 2019-08-01 VITALS — BP 140/80 | HR 62 | Temp 96.1°F | Ht 62.0 in | Wt 218.8 lb

## 2019-08-01 DIAGNOSIS — E1169 Type 2 diabetes mellitus with other specified complication: Secondary | ICD-10-CM | POA: Diagnosis not present

## 2019-08-01 DIAGNOSIS — E1122 Type 2 diabetes mellitus with diabetic chronic kidney disease: Secondary | ICD-10-CM

## 2019-08-01 DIAGNOSIS — N182 Chronic kidney disease, stage 2 (mild): Secondary | ICD-10-CM

## 2019-08-01 DIAGNOSIS — Z79899 Other long term (current) drug therapy: Secondary | ICD-10-CM

## 2019-08-01 DIAGNOSIS — I5181 Takotsubo syndrome: Secondary | ICD-10-CM | POA: Diagnosis not present

## 2019-08-01 DIAGNOSIS — E785 Hyperlipidemia, unspecified: Secondary | ICD-10-CM

## 2019-08-01 DIAGNOSIS — E559 Vitamin D deficiency, unspecified: Secondary | ICD-10-CM

## 2019-08-01 DIAGNOSIS — I10 Essential (primary) hypertension: Secondary | ICD-10-CM

## 2019-08-01 DIAGNOSIS — IMO0001 Reserved for inherently not codable concepts without codable children: Secondary | ICD-10-CM

## 2019-08-01 DIAGNOSIS — I129 Hypertensive chronic kidney disease with stage 1 through stage 4 chronic kidney disease, or unspecified chronic kidney disease: Secondary | ICD-10-CM

## 2019-08-01 NOTE — Patient Instructions (Signed)
Goals    . Exercise 5x per week (15-20 min per time)     Moderate intensity - enough to get heart rate up and be unable to speak in complete sentences    . HEMOGLOBIN A1C < 5.7    . Weight (lb) < 200 lb (90.7 kg)       Ask insurance whether they cover the new shingles vaccine - Shingrix      Zoster Vaccine, Recombinant injection What is this medicine? ZOSTER VACCINE (ZOS ter vak SEEN) is used to prevent shingles in adults 69 years old and over. This vaccine is not used to treat shingles or nerve pain from shingles. This medicine may be used for other purposes; ask your health care provider or pharmacist if you have questions. COMMON BRAND NAME(S): [facility] What should I tell my health care provider before I take this medicine? They need to know if you have any of these conditions:  blood disorders or disease  cancer like leukemia or lymphoma  immune system problems or therapy  an unusual or allergic reaction to vaccines, other medications, foods, dyes, or preservatives  pregnant or trying to get pregnant  breast-feeding How should I use this medicine? This vaccine is for injection in a muscle. It is given by a health care professional. Talk to your pediatrician regarding the use of this medicine in children. This medicine is not approved for use in children. Overdosage: If you think you have taken too much of this medicine contact a poison control center or emergency room at once. NOTE: This medicine is only for you. Do not share this medicine with others. What if I miss a dose? Keep appointments for follow-up (booster) doses as directed. It is important not to miss your dose. Call your doctor or health care professional if you are unable to keep an appointment. What may interact with this medicine?  medicines that suppress your immune system  medicines to treat cancer  steroid medicines like prednisone or cortisone This list may not describe all possible interactions.  Give your health care provider a list of all the medicines, herbs, non-prescription drugs, or dietary supplements you use. Also tell them if you smoke, drink alcohol, or use illegal drugs. Some items may interact with your medicine. What should I watch for while using this medicine? Visit your doctor for regular check ups. This vaccine, like all vaccines, may not fully protect everyone. What side effects may I notice from receiving this medicine? Side effects that you should report to your doctor or health care professional as soon as possible:  allergic reactions like skin rash, itching or hives, swelling of the face, lips, or tongue  breathing problems Side effects that usually do not require medical attention (report these to your doctor or health care professional if they continue or are bothersome):  chills  headache  fever  nausea, vomiting  redness, warmth, pain, swelling or itching at site where injected  tiredness This list may not describe all possible side effects. Call your doctor for medical advice about side effects. You may report side effects to FDA at 1-800-FDA-1088. Where should I keep my medicine? This vaccine is only given in a clinic, pharmacy, doctor's office, or other health care setting and will not be stored at home. NOTE: This sheet is a summary. It may not cover all possible information. If you have questions about this medicine, talk to your doctor, pharmacist, or health care provider.  2020 Elsevier/Gold Standard (2017-03-13 13:20:30)

## 2019-08-02 LAB — COMPLETE METABOLIC PANEL WITH GFR
AG Ratio: 2.1 (calc) (ref 1.0–2.5)
ALT: 23 U/L (ref 6–29)
AST: 16 U/L (ref 10–35)
Albumin: 4.6 g/dL (ref 3.6–5.1)
Alkaline phosphatase (APISO): 68 U/L (ref 37–153)
BUN: 15 mg/dL (ref 7–25)
CO2: 25 mmol/L (ref 20–32)
Calcium: 9.7 mg/dL (ref 8.6–10.4)
Chloride: 102 mmol/L (ref 98–110)
Creat: 0.75 mg/dL (ref 0.50–0.99)
GFR, Est African American: 94 mL/min/{1.73_m2} (ref >=60)
GFR, Est Non African American: 81 mL/min/{1.73_m2} (ref >=60)
Globulin: 2.2 g/dL (calc) (ref 1.9–3.7)
Glucose, Bld: 147 mg/dL — ABNORMAL HIGH (ref 65–99)
Potassium: 4.6 mmol/L (ref 3.5–5.3)
Sodium: 139 mmol/L (ref 135–146)
Total Bilirubin: 0.7 mg/dL (ref 0.2–1.2)
Total Protein: 6.8 g/dL (ref 6.1–8.1)

## 2019-08-02 LAB — CBC WITH DIFFERENTIAL/PLATELET
Absolute Monocytes: 429 cells/uL (ref 200–950)
Basophils Absolute: 59 cells/uL (ref 0–200)
Basophils Relative: 0.9 %
Eosinophils Absolute: 202 cells/uL (ref 15–500)
Eosinophils Relative: 3.1 %
HCT: 41.3 % (ref 35.0–45.0)
Hemoglobin: 13.5 g/dL (ref 11.7–15.5)
Lymphs Abs: 1638 cells/uL (ref 850–3900)
MCH: 27.9 pg (ref 27.0–33.0)
MCHC: 32.7 g/dL (ref 32.0–36.0)
MCV: 85.3 fL (ref 80.0–100.0)
MPV: 12 fL (ref 7.5–12.5)
Monocytes Relative: 6.6 %
Neutro Abs: 4173 cells/uL (ref 1500–7800)
Neutrophils Relative %: 64.2 %
Platelets: 241 10*3/uL (ref 140–400)
RBC: 4.84 10*6/uL (ref 3.80–5.10)
RDW: 13.2 % (ref 11.0–15.0)
Total Lymphocyte: 25.2 %
WBC: 6.5 10*3/uL (ref 3.8–10.8)

## 2019-08-02 LAB — LIPID PANEL
Cholesterol: 129 mg/dL (ref <200)
HDL: 50 mg/dL (ref >=50)
LDL Cholesterol (Calc): 57 mg/dL (calc)
Non-HDL Cholesterol (Calc): 79 mg/dL (calc) (ref <130)
Total CHOL/HDL Ratio: 2.6 (calc) (ref <5.0)
Triglycerides: 137 mg/dL (ref <150)

## 2019-08-02 LAB — HEMOGLOBIN A1C
Hgb A1c MFr Bld: 6 % of total Hgb — ABNORMAL HIGH (ref <5.7)
Mean Plasma Glucose: 126 (calc)
eAG (mmol/L): 7 (calc)

## 2019-08-02 LAB — TSH: TSH: 3.23 mIU/L (ref 0.40–4.50)

## 2019-08-02 LAB — MAGNESIUM: Magnesium: 1.8 mg/dL (ref 1.5–2.5)

## 2019-08-06 LAB — HM MAMMOGRAPHY

## 2019-08-11 ENCOUNTER — Other Ambulatory Visit: Payer: Self-pay | Admitting: Internal Medicine

## 2019-08-11 DIAGNOSIS — E782 Mixed hyperlipidemia: Secondary | ICD-10-CM

## 2019-08-11 MED ORDER — ROSUVASTATIN CALCIUM 20 MG PO TABS: ORAL_TABLET | ORAL | 3 refills | Status: DC

## 2019-08-15 ENCOUNTER — Other Ambulatory Visit: Payer: Self-pay | Admitting: Internal Medicine

## 2019-08-18 ENCOUNTER — Other Ambulatory Visit: Payer: Self-pay | Admitting: Internal Medicine

## 2019-08-18 MED ORDER — BISOPROLOL-HYDROCHLOROTHIAZIDE 10-6.25 MG PO TABS: ORAL_TABLET | ORAL | 3 refills | Status: DC

## 2019-08-29 DIAGNOSIS — H5203 Hypermetropia, bilateral: Secondary | ICD-10-CM | POA: Diagnosis not present

## 2019-08-29 DIAGNOSIS — H524 Presbyopia: Secondary | ICD-10-CM | POA: Diagnosis not present

## 2019-08-29 DIAGNOSIS — Z961 Presence of intraocular lens: Secondary | ICD-10-CM | POA: Diagnosis not present

## 2019-08-29 DIAGNOSIS — E119 Type 2 diabetes mellitus without complications: Secondary | ICD-10-CM | POA: Diagnosis not present

## 2019-08-29 LAB — HM DIABETES EYE EXAM

## 2019-10-19 ENCOUNTER — Ambulatory Visit: Payer: Medicare PPO | Attending: Internal Medicine

## 2019-10-19 DIAGNOSIS — Z23 Encounter for immunization: Secondary | ICD-10-CM | POA: Insufficient documentation

## 2019-10-19 NOTE — Progress Notes (Signed)
   Covid-19 Vaccination Clinic  Name:  [name]    MRN: [id number] DOB: Sep 30, 1950  10/19/2019  [name] was observed post Covid-19 immunization for 15 minutes without incident. She was provided with Vaccine Information Sheet and instruction to access the V-Safe system.   [name] was instructed to call 911 with any severe reactions post vaccine: Marland Kitchen Difficulty breathing  . Swelling of face and throat  . A fast heartbeat  . A bad rash all over body  . Dizziness and weakness   Immunizations Administered    Name Date Dose VIS Date Route   Pfizer COVID-19 Vaccine 10/19/2019  3:12 PM 0.3 mL 07/26/2019 Intramuscular   Manufacturer: [place]   Lot: [id number]   [place]: [id number]

## 2019-11-09 ENCOUNTER — Ambulatory Visit: Payer: Medicare PPO | Attending: Internal Medicine

## 2019-11-09 DIAGNOSIS — Z23 Encounter for immunization: Secondary | ICD-10-CM

## 2019-11-09 NOTE — Progress Notes (Signed)
   Covid-19 Vaccination Clinic  Name:  XIOMARA STRAKA    MRN: [id number] DOB: 01-Jan-1951  11/09/2019  Ms. Dijulio was observed post Covid-19 immunization for 15 minutes without incident. She was provided with Vaccine Information Sheet and instruction to access the V-Safe system.   Ms. Lysiak was instructed to call 911 with any severe reactions post vaccine: Marland Kitchen Difficulty breathing  . Swelling of face and throat  . A fast heartbeat  . A bad rash all over body  . Dizziness and weakness   Immunizations Administered    Name Date Dose VIS Date Route   Pfizer COVID-19 Vaccine 11/09/2019  1:57 PM 0.3 mL 07/26/2019 Intramuscular   Manufacturer: [employer]   Lot: [id number]   [place]: [id number]

## 2019-11-10 ENCOUNTER — Encounter: Payer: Self-pay | Admitting: Internal Medicine

## 2019-11-10 NOTE — Progress Notes (Signed)
Annual Screening/Preventative Visit & Comprehensive Evaluation &  Examination     This very nice 69 y.o. DWF  presents for a Screening /Preventative Visit & comprehensive evaluation and management of multiple medical co-morbidities.  Patient has been followed for HTN, HLD, T2_NIDDM  and Vitamin D Deficiency.      HTN predates since 75. In 2004, Patient was dx'd w/ a Viral Myocarditis and  she had a  Normal heart cath in 2006 and then  a negative Myoview in 2009. Patient's BP has been controlled at home and patient denies any cardiac symptoms as chest pain, palpitations, shortness of breath, dizziness or ankle swelling. Today's BP - 126/80.      Patient's hyperlipidemia is controlled with diet and Rosuvastatin. Patient denies myalgias or other medication SE's. Last lipids were at goal:  Lab Results  Component Value Date   CHOL 129 08/01/2019   HDL 50 08/01/2019   LDLCALC 57 08/01/2019   TRIG 137 08/01/2019   CHOLHDL 2.6 08/01/2019       Patient has hx/o Morbid Obesity (BMI 41+) and consequent  T2_NIDDM w/CKD2 (2007) treated with Metformin  and patient denies reactive hypoglycemic symptoms, visual blurring, diabetic polys or paresthesias. Last A1c was not at goal:  Lab Results  Component Value Date   HGBA1C 6.0 (H) 08/01/2019       Finally, patient has history of Vitamin D Deficiency ("20" / 2008) and last Vitamin D was at goal:  Lab Results  Component Value Date   VD25OH 96 09/27/2018    Current Outpatient Medications on File Prior to Visit  Medication Sig  . aspirin EC 81 MG tablet Take 81 mg by mouth daily.  . bisoprolol-hydrochlorothiazide (ZIAC) 10-6.25 MG tablet Take 1 tablet Daily for BP  . Cholecalciferol (VITAMIN D-3) 5000 UNITS TABS Take 2 tablets by mouth every morning.   Marland Kitchen losartan (COZAAR) 50 MG tablet TAKE 1 TABLET(50 MG) BY MOUTH DAILY  . metFORMIN (GLUCOPHAGE-XR) 500 MG 24 hr tablet Take 2 tablets 2 x /day with Meals for Diabetes  . rosuvastatin (CRESTOR) 20  MG tablet Take 1 tablet Daily for Cholesterol   No current facility-administered medications on file prior to visit.   No Known Allergies   Past Medical History:  Diagnosis Date  . Arthritis    hands, feet  . Atherosclerosis of coronary artery of native heart 09/2002   per cardiac cath 10-10-2002 mild non-obstructive of LAD  . CKD (chronic kidney disease), stage II   . Endometrial polyp   . Hemorrhoids   . History of cardiomyopathy in adulthood per cardiologist, dr Lia Foyer, note in epic , Takotsuku cardiomyopathy (resolved) and release by cardiology 2012;   10-01-2018 per pt denies any symptoms past several years   per cath 10-10-2002  ef 25% with wall motion abnormalities;  follow-up cath 11-22-2004  resolution wall motion abnormalities and normal LVF;   echo 11-25-2010  ef 55%, mild LAE,  trivial MR, PR, TR  . HTN (hypertension)   . Hypercholesterolemia   . Right sided sciatica 12/12/2017  . Type 2 diabetes mellitus ([place])    followed by pcp   Health Maintenance  Topic Date Due  . HEMOGLOBIN A1C  01/30/2020  . DEXA SCAN  02/04/2020  . MAMMOGRAM  08/05/2020  . OPHTHALMOLOGY EXAM  08/28/2020  . FOOT EXAM  11/09/2020  . TETANUS/TDAP  03/11/2024  . COLONOSCOPY  01/22/2025  . INFLUENZA VACCINE  Completed  . Hepatitis C Screening  Completed  . PNA vac Low Risk  Adult  Completed   Immunization History  Administered Date(s) Administered  . Influenza, High Dose Seasonal PF 04/30/2015, 05/23/2017  . Influenza-Unspecified 04/15/2013, 05/06/2014, 04/29/2019  . PFIZER SARS-COV-2 Vaccination 10/19/2019, 11/09/2019  . Pneumococcal Conjugate-13 11/01/2016  . Pneumococcal Polysaccharide-23 04/30/2015  . Tdap 03/11/2014  . Zoster 05/23/2012   Last Colon - 06.10.2016 - Dr Deatra Ina - Recc  10 year f/u  - due June 2026 Last Dexa BMD - 06.22.2019 - Normal  Last MGM - 08/06/2019 - Negative  Past Surgical History:  Procedure Laterality Date  . CARDIAC CATHETERIZATION  11/25/2004  dr Lia Foyer    mild non-obstrucitve coronary atherosclerosis involving LAD,  normal LVF and resolution wall abnormalities from previous cath  . CARDIAC CATHETERIZATION  10/10/2002   dr Lyndel Safe   mild non-obstructive cad,  ef 25% with anterior akinesis, apical hypokinesis and inferior dyskinesis  . CATARACT EXTRACTION W/ INTRAOCULAR LENS  IMPLANT, BILATERAL  07/2009  . CESAREAN SECTION  x3   last one 10/ 1988    W/ BILATERAL TUBAL LIGATION WITH LAST C/S  . CHOLECYSTECTOMY OPEN  1979  . DILATATION & CURETTAGE/HYSTEROSCOPY WITH MYOSURE N/A 10/05/2018   Procedure: DILATATION & CURETTAGE/HYSTEROSCOPY WITH MYOSURE;  Surgeon: Anastasio Auerbach, MD;  Location: [facility];  Service: Gynecology;  Laterality: N/A;  . KNEE ARTHROSCOPY WITH MEDIAL MENISECTOMY Left 02/25/2014   Procedure: LEFT KNEE ARTHROSCOPY WITH MEDIAL AND LATERAL MENISECTOMY, MICROFRACTURE, SYNOVECTOMY SUPRA PATELLA;  Surgeon: Tobi Bastos, MD;  Location: [facility];  Service: Orthopedics;  Laterality: Left;  . TOE SURGERY  1990   BILATERAL GREAT TOE'S   Family History  Problem Relation Age of Onset  . Hypertension Mother   . COPD Mother   . Thyroid disease Mother   . Heart attack Father   . Heart disease Father   . Thyroid disease Sister   . Arthritis Sister        Rheumatoid  . Hypertension Sister   . Graves' disease Sister   . Colon cancer Maternal Grandfather    Social History   Tobacco Use  . Smoking status: Never Smoker  . Smokeless tobacco: Never Used  Substance Use Topics  . Alcohol use: Yes    Alcohol/week: 0.0 standard drinks    Comment: rare; twice yearly  . Drug use: Never    ROS Constitutional: Denies fever, chills, weight loss/gain, headaches, insomnia,  night sweats, and change in appetite. Does c/o fatigue. Eyes: Denies redness, blurred vision, diplopia, discharge, itchy, watery eyes.  ENT: Denies discharge, congestion, post nasal drip, epistaxis, sore throat, earache, hearing loss, dental pain,  Tinnitus, Vertigo, Sinus pain, snoring.  Cardio: Denies chest pain, palpitations, irregular heartbeat, syncope, dyspnea, diaphoresis, orthopnea, PND, claudication, edema Respiratory: denies cough, dyspnea, DOE, pleurisy, hoarseness, laryngitis, wheezing.  Gastrointestinal: Denies dysphagia, heartburn, reflux, water brash, pain, cramps, nausea, vomiting, bloating, diarrhea, constipation, hematemesis, melena, hematochezia, jaundice, hemorrhoids Genitourinary: Denies dysuria, frequency, urgency, nocturia, hesitancy, discharge, hematuria, flank pain Breast: Breast lumps, nipple discharge, bleeding.  Musculoskeletal: Denies arthralgia, myalgia, stiffness, Jt. Swelling, pain, limp, and strain/sprain. Denies falls. Skin: Denies puritis, rash, hives, warts, acne, eczema, changing in skin lesion Neuro: No weakness, tremor, incoordination, spasms, paresthesia, pain Psychiatric: Denies confusion, memory loss, sensory loss. Denies Depression. Endocrine: Denies change in weight, skin, hair change, nocturia, and paresthesia, diabetic polys, visual blurring, hyper / hypo glycemic episodes.  Heme/Lymph: No excessive bleeding, bruising, enlarged lymph nodes.  Physical Exam  BP 126/80   Pulse 64   Temp (!) 97.4 F (36.3 C)  Resp 16   Ht 5\' 2"  (1.575 m)   Wt 219 lb 12.8 oz (99.7 kg)   BMI 40.20 kg/m   General Appearance: Well nourished, well groomed and in no apparent distress.  Eyes: PERRLA, EOMs, conjunctiva no swelling or erythema, normal fundi and vessels. Sinuses: No frontal/maxillary tenderness ENT/Mouth: EACs patent / TMs  nl. Nares clear without erythema, swelling, mucoid exudates. Oral hygiene is good. No erythema, swelling, or exudate. Tongue normal, non-obstructing. Tonsils not swollen or erythematous. Hearing normal.  Neck: Supple, thyroid not palpable. No bruits, nodes or JVD. Respiratory: Respiratory effort normal.  BS equal and clear bilateral without rales, rhonci, wheezing or  stridor. Cardio: Heart sounds are normal with regular rate and rhythm and no murmurs, rubs or gallops. Peripheral pulses are normal and equal bilaterally without edema. No aortic or femoral bruits. Chest: symmetric with normal excursions and percussion. Breasts: Symmetric, without lumps, nipple discharge, retractions, or fibrocystic changes.  Abdomen: Flat, soft with bowel sounds active. Nontender, no guarding, rebound, hernias, masses, or organomegaly.  Lymphatics: Non tender without lymphadenopathy.  Musculoskeletal: Full ROM all peripheral extremities, joint stability, 5/5 strength, and normal gait. Skin: Warm and dry without rashes, lesions, cyanosis, clubbing or  ecchymosis.  Neuro: Cranial nerves intact, reflexes equal bilaterally. Normal muscle tone, no cerebellar symptoms. Sensation intact.  Pysch: Alert and oriented X 3, normal affect, Insight and Judgment appropriate.   Assessment and Plan  1. Annual Preventative Screening Examination  2. Essential hypertension  - EKG 12-Lead - Urinalysis, Routine w reflex microscopic - Microalbumin / creatinine urine ratio - CBC with Differential/Platelet - COMPLETE METABOLIC PANEL WITH GFR - Magnesium - TSH  3. Hyperlipidemia associated with type 2 diabetes mellitus ([place])  - EKG 12-Lead - Lipid panel - TSH  4. Type 2 diabetes mellitus with stage 2 chronic kidney disease,  without long-term current use of insulin ([facility])  - EKG 12-Lead - Urinalysis, Routine w reflex microscopic - Microalbumin / creatinine urine ratio - HM DIABETES FOOT EXAM - LOW EXTREMITY NEUR EXAM DOCUM - Hemoglobin A1c - Insulin, random  5. Vitamin D deficiency  - VITAMIN D 25 Hydroxy   6. Class 3 severe obesity due to excess calories with serious comorbidity  and body mass index (BMI) of 40.0 to 44.9 in adult ([facility])  - TSH  7. Screening for colorectal cancer  - POC Hemoccult Bld/Stl   8. Screening for ischemic heart disease  - EKG 12-Lead  9.  FHx: heart disease  - EKG 12-Lead  10. Medication management  - Urinalysis, Routine w reflex microscopic - Microalbumin / creatinine urine ratio - CBC with Differential/Platelet - COMPLETE METABOLIC PANEL WITH GFR - Magnesium - Lipid panel - TSH - Hemoglobin A1c - Insulin, random - VITAMIN D 25 Hydroxy        Patient was counseled in prudent diet to achieve/maintain BMI less than 25 for weight control, BP monitoring, regular exercise and medications. Discussed med's effects and SE's. Screening labs and tests as requested with regular follow-up as recommended. Over 40 minutes of exam, counseling, chart review and high complex critical decision making was performed.   Kirtland Bouchard, MD

## 2019-11-10 NOTE — Patient Instructions (Signed)

## 2019-11-11 ENCOUNTER — Other Ambulatory Visit: Payer: Self-pay

## 2019-11-11 ENCOUNTER — Ambulatory Visit: Payer: Medicare PPO | Admitting: Internal Medicine

## 2019-11-11 VITALS — BP 126/80 | HR 64 | Temp 97.4°F | Resp 16 | Ht 62.0 in | Wt 219.8 lb

## 2019-11-11 DIAGNOSIS — I10 Essential (primary) hypertension: Secondary | ICD-10-CM | POA: Diagnosis not present

## 2019-11-11 DIAGNOSIS — E559 Vitamin D deficiency, unspecified: Secondary | ICD-10-CM | POA: Diagnosis not present

## 2019-11-11 DIAGNOSIS — N182 Chronic kidney disease, stage 2 (mild): Secondary | ICD-10-CM | POA: Diagnosis not present

## 2019-11-11 DIAGNOSIS — Z6841 Body Mass Index (BMI) 40.0 and over, adult: Secondary | ICD-10-CM | POA: Diagnosis not present

## 2019-11-11 DIAGNOSIS — E1169 Type 2 diabetes mellitus with other specified complication: Secondary | ICD-10-CM | POA: Diagnosis not present

## 2019-11-11 DIAGNOSIS — E1122 Type 2 diabetes mellitus with diabetic chronic kidney disease: Secondary | ICD-10-CM

## 2019-11-11 DIAGNOSIS — Z79899 Other long term (current) drug therapy: Secondary | ICD-10-CM

## 2019-11-11 DIAGNOSIS — Z136 Encounter for screening for cardiovascular disorders: Secondary | ICD-10-CM | POA: Diagnosis not present

## 2019-11-11 DIAGNOSIS — Z0001 Encounter for general adult medical examination with abnormal findings: Secondary | ICD-10-CM

## 2019-11-11 DIAGNOSIS — Z0000 Encounter for general adult medical examination without abnormal findings: Secondary | ICD-10-CM | POA: Diagnosis not present

## 2019-11-11 DIAGNOSIS — E785 Hyperlipidemia, unspecified: Secondary | ICD-10-CM | POA: Diagnosis not present

## 2019-11-11 DIAGNOSIS — Z8249 Family history of ischemic heart disease and other diseases of the circulatory system: Secondary | ICD-10-CM | POA: Diagnosis not present

## 2019-11-11 DIAGNOSIS — Z1212 Encounter for screening for malignant neoplasm of rectum: Secondary | ICD-10-CM

## 2019-11-11 DIAGNOSIS — Z1211 Encounter for screening for malignant neoplasm of colon: Secondary | ICD-10-CM

## 2019-11-12 LAB — COMPLETE METABOLIC PANEL WITH GFR
AG Ratio: 1.9 (calc) (ref 1.0–2.5)
ALT: 18 U/L (ref 6–29)
AST: 13 U/L (ref 10–35)
Albumin: 4.4 g/dL (ref 3.6–5.1)
Alkaline phosphatase (APISO): 51 U/L (ref 37–153)
BUN: 12 mg/dL (ref 7–25)
CO2: 26 mmol/L (ref 20–32)
Calcium: 9.9 mg/dL (ref 8.6–10.4)
Chloride: 102 mmol/L (ref 98–110)
Creat: 0.57 mg/dL (ref 0.50–0.99)
GFR, Est African American: 110 mL/min/{1.73_m2} (ref >=60)
GFR, Est Non African American: 95 mL/min/{1.73_m2} (ref >=60)
Globulin: 2.3 g/dL (calc) (ref 1.9–3.7)
Glucose, Bld: 92 mg/dL (ref 65–99)
Potassium: 4.1 mmol/L (ref 3.5–5.3)
Sodium: 138 mmol/L (ref 135–146)
Total Bilirubin: 0.8 mg/dL (ref 0.2–1.2)
Total Protein: 6.7 g/dL (ref 6.1–8.1)

## 2019-11-12 LAB — CBC WITH DIFFERENTIAL/PLATELET
Absolute Monocytes: 521 cells/uL (ref 200–950)
Basophils Absolute: 53 cells/uL (ref 0–200)
Basophils Relative: 0.8 %
Eosinophils Absolute: 218 cells/uL (ref 15–500)
Eosinophils Relative: 3.3 %
HCT: 41.4 % (ref 35.0–45.0)
Hemoglobin: 13.7 g/dL (ref 11.7–15.5)
Lymphs Abs: 1993 cells/uL (ref 850–3900)
MCH: 28.7 pg (ref 27.0–33.0)
MCHC: 33.1 g/dL (ref 32.0–36.0)
MCV: 86.8 fL (ref 80.0–100.0)
MPV: 12.3 fL (ref 7.5–12.5)
Monocytes Relative: 7.9 %
Neutro Abs: 3815 cells/uL (ref 1500–7800)
Neutrophils Relative %: 57.8 %
Platelets: 207 10*3/uL (ref 140–400)
RBC: 4.77 10*6/uL (ref 3.80–5.10)
RDW: 13 % (ref 11.0–15.0)
Total Lymphocyte: 30.2 %
WBC: 6.6 10*3/uL (ref 3.8–10.8)

## 2019-11-12 LAB — MICROALBUMIN / CREATININE URINE RATIO
Creatinine, Urine: 9 mg/dL — ABNORMAL LOW (ref 20–275)
Microalb Creat Ratio: 33 mcg/mg creat — ABNORMAL HIGH (ref <30)
Microalb, Ur: 0.3 mg/dL

## 2019-11-12 LAB — HEMOGLOBIN A1C
Hgb A1c MFr Bld: 6.1 % of total Hgb — ABNORMAL HIGH (ref <5.7)
Mean Plasma Glucose: 128 (calc)
eAG (mmol/L): 7.1 (calc)

## 2019-11-12 LAB — LIPID PANEL
Cholesterol: 125 mg/dL (ref <200)
HDL: 52 mg/dL (ref >=50)
LDL Cholesterol (Calc): 49 mg/dL (calc)
Non-HDL Cholesterol (Calc): 73 mg/dL (calc) (ref <130)
Total CHOL/HDL Ratio: 2.4 (calc) (ref <5.0)
Triglycerides: 163 mg/dL — ABNORMAL HIGH (ref <150)

## 2019-11-12 LAB — URINALYSIS, ROUTINE W REFLEX MICROSCOPIC
Bilirubin Urine: NEGATIVE
Glucose, UA: NEGATIVE
Hgb urine dipstick: NEGATIVE
Ketones, ur: NEGATIVE
Leukocytes,Ua: NEGATIVE
Nitrite: NEGATIVE
Protein, ur: NEGATIVE
Specific Gravity, Urine: 1.004 (ref 1.001–1.03)
pH: 6.5 (ref 5.0–8.0)

## 2019-11-12 LAB — VITAMIN D 25 HYDROXY (VIT D DEFICIENCY, FRACTURES): Vit D, 25-Hydroxy: 91 ng/mL (ref 30–100)

## 2019-11-12 LAB — MAGNESIUM: Magnesium: 1.7 mg/dL (ref 1.5–2.5)

## 2019-11-12 LAB — INSULIN, RANDOM: Insulin: 19.5 u[IU]/mL

## 2019-11-12 LAB — TSH: TSH: 3.85 mIU/L (ref 0.40–4.50)

## 2019-11-17 ENCOUNTER — Other Ambulatory Visit: Payer: Self-pay | Admitting: Adult Health

## 2019-11-18 ENCOUNTER — Ambulatory Visit: Payer: Medicare PPO

## 2019-11-20 ENCOUNTER — Ambulatory Visit: Payer: Medicare PPO

## 2019-11-21 ENCOUNTER — Other Ambulatory Visit: Payer: Self-pay

## 2019-11-21 DIAGNOSIS — Z1211 Encounter for screening for malignant neoplasm of colon: Secondary | ICD-10-CM

## 2019-11-21 DIAGNOSIS — Z1212 Encounter for screening for malignant neoplasm of rectum: Secondary | ICD-10-CM | POA: Diagnosis not present

## 2019-11-21 LAB — POC HEMOCCULT BLD/STL (HOME/3-CARD/SCREEN)
Card #2 Fecal Occult Blod, POC: NEGATIVE
Card #3 Fecal Occult Blood, POC: NEGATIVE
Fecal Occult Blood, POC: NEGATIVE

## 2020-01-06 ENCOUNTER — Encounter: Payer: Self-pay | Admitting: Internal Medicine

## 2020-02-12 NOTE — Progress Notes (Signed)
MEDICARE ANNUAL WELLNESS VISIT AND FOLLOW UP  Assessment:   Diagnoses and all orders for this visit:  Encounter for Medicare annual wellness exam  1 year  Takotsubo syndrome No symptoms for over 5 years Has been released by cardiology  Stress-induced cardiomyopathy  Has been released by cardiology; monitor   Hemorrhoids, unspecified hemorrhoid type Asymptomatic at this time; use anusol PRN  Essential hypertension Continue medications Monitor blood pressure at home; call if consistently over 130/80 Continue DASH diet.   Reminder to go to the ER if any CP, SOB, nausea, dizziness, severe HA, changes vision/speech, left arm numbness and tingling and jaw pain.  Type 2 diabetes mellitus with stage 2 chronic kidney disease, without long-term current use of insulin [facility]) Education: Reviewed 'ABCs' of diabetes management (respective goals in parentheses):  A1C (<7), blood pressure (<130/80), and cholesterol (LDL <70) Eye Exam yearly and Dental Exam every 6 months- up to date metformin 4 tabs daily  Dietary recommendations Physical Activity recommendations Foot exam up to date   DJD Managed by OTC analgesics Followed by [facility] Dr. Gladstone Lighter, Dr. Veverly Fells Weight loss advised  Vitamin D deficiency Continue supplementation Check vitamin D level q 6 months or as needed  Morbid obesity (BMI 40)  Long discussion about weight loss, diet, and exercise Recommended diet heavy in fruits and veggies and low in animal meats, cheeses, and dairy products, appropriate calorie intake Discussed appropriate weight for height, she would prefer not to set weight goal, working on general good habits with goal to normalize A1C and get off meds Follow up at next visit  Medication management CBC, CMP/GFR, magnesium   Gastroesophageal reflux disease, esophagitis presence not specified Well managed on current medications Discussed diet, avoiding triggers and other lifestyle changes    Hyperlipidemia associated with type 2 diabetes mellitus ([place]) -     Lipid panel -     Hemoglobin A1c check lipids decrease fatty foods increase activity.   diabetes mellitus with chronic kidney disease and hypertension ([place]) -     Hemoglobin A1c  CKD stage 2 due to type 2 diabetes mellitus ([place]) -     COMPLETE METABOLIC PANEL WITH GFR Increase fluids, avoid NSAIDS, monitor sugars, will monitor    Over 40 minutes of exam, counseling, chart review and critical decision making was performed Future Appointments  Date Time Provider [place]  05/27/2020  9:30 AM Vicie Mutters, PA-C [facility] None  11/17/2020  3:00 PM [name], MD [facility] None  02/18/2021  9:00 AM Vicie Mutters, PA-C [facility] None     Plan:   During the course of the visit the patient was educated and counseled about appropriate screening and preventive services including:    Pneumococcal vaccine   Prevnar 13  Influenza vaccine  Td vaccine  Screening electrocardiogram  Bone densitometry screening  Colorectal cancer screening  Diabetes screening  Glaucoma screening  Nutrition counseling   Advanced directives: requested   Subjective:  [name] is a 69 y.o. female who presents for Medicare Annual Wellness Visit and 3 month follow up.   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 Has been released by cardiology. We are monitoring   Established with [facility]. She had a tumble up her stairs and caught herself with right arm, has appointment tomorrow.   she has a diagnosis of GERD which is currently doing well off  of medications.  she reports symptoms is currently well controlled, and denies breakthrough reflux, burning in chest, hoarseness or cough.    BMI is Body mass index is 40.24 kg/m., she is still trying to be active.  Wt Readings from Last 3 Encounters:   02/13/20 220 lb (99.8 kg)  11/11/19 219 lb 12.8 oz (99.7 kg)  08/01/19 218 lb 12.8 oz (99.2 kg)   She does not check BP at home, she forgot to take her Ziac this AM, today their BP is BP: 140/76 She does not workout. She denies chest pain, shortness of breath, dizziness.   She is on cholesterol medication (rosuvastatin 20 mg daily) and denies myalgias. Her cholesterol is at goal less than 70. The cholesterol last visit was:   Lab Results  Component Value Date   CHOL 130 02/13/2020   HDL 55 02/13/2020   LDLCALC 47 02/13/2020   TRIG 224 (H) 02/13/2020   CHOLHDL 2.4 02/13/2020    She has not been working on diet and exercise for T2DM well controlled by metformin, DEE Dr. Delman Cheadle 08/2019 abstracted and denies foot ulcerations, increased appetite, nausea, paresthesia of the feet, polydipsia, polyuria, visual disturbances, vomiting and weight loss. She does have glucometer but hasn't been checking due to recent very well controlled values. Last A1C in the office was:  Lab Results  Component Value Date   HGBA1C 6.2 (H) 02/13/2020   Last GFR: Lab Results  Component Value Date   GFRNONAA 89 02/13/2020   Patient is on Vitamin D supplement and at goal at recent check:    Lab Results  Component Value Date   VD25OH 91 11/11/2019      Medication Review:  Current Outpatient Medications (Endocrine & Metabolic):  .  metFORMIN (GLUCOPHAGE-XR) 500 MG 24 hr tablet, Take 2 tablets 2 x /day with Meals for Diabetes  Current Outpatient Medications (Cardiovascular):  .  bisoprolol-hydrochlorothiazide (ZIAC) 10-6.25 MG tablet, Take 1 tablet Daily for BP .  losartan (COZAAR) 50 MG tablet, TAKE 1 TABLET(50 MG) BY MOUTH DAILY .  rosuvastatin (CRESTOR) 20 MG tablet, Take 1 tablet Daily for Cholesterol   Current Outpatient Medications (Analgesics):  .  aspirin EC 81 MG tablet, Take 81 mg by mouth daily.   Current Outpatient Medications (Other):  Marland Kitchen  Cholecalciferol (VITAMIN D-3) 5000 UNITS TABS, Take  2 tablets by mouth every morning.   No Known Allergies  Current Problems (verified) Patient Active Problem List   Diagnosis Date Noted  . CKD stage 2 due to type 2 diabetes mellitus ([place]) 01/02/2019  . Acid reflux 12/11/2017  . Takotsubo syndrome 07/16/2015  . Morbid obesity ([place]))  06/02/2014  . Osteoarthritis of left knee 02/25/2014  . Hyperlipidemia associated with type 2 diabetes mellitus ([place]) 12/08/2013  . Vitamin D deficiency 12/08/2013  . Medication management 12/08/2013  . 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 ([place]) 09/13/2013  . Stress-induced cardiomyopathy 11/18/2010  . Essential hypertension 10/29/2007  . Hemorrhoids 10/29/2007  . DJD 10/29/2007    Screening Tests Immunization History  Administered Date(s) Administered  . Influenza, High Dose Seasonal PF 04/30/2015, 05/23/2017  . Influenza-Unspecified 04/15/2013, 05/06/2014, 04/29/2019  . PFIZER SARS-COV-2 Vaccination 10/19/2019, 11/09/2019  . Pneumococcal Conjugate-13 11/01/2016  . Pneumococcal Polysaccharide-23 04/30/2015  . Tdap 03/11/2014  . Zoster 05/23/2012   Health Maintenance  Topic Date Due  . DEXA SCAN  02/04/2020  . INFLUENZA VACCINE  03/15/2020  . MAMMOGRAM  08/05/2020  . HEMOGLOBIN A1C  08/15/2020  . OPHTHALMOLOGY  EXAM  08/28/2020  . FOOT EXAM  02/12/2021  . TETANUS/TDAP  03/11/2024  . COLONOSCOPY  01/22/2025  . COVID-19 Vaccine  Completed  . Hepatitis C Screening  Completed  . PNA vac Low Risk Adult  Completed   Preventative care: Last colonoscopy: 2016 due 2026 Pap: 06/2018 by Dr. Phineas Real - endometrial polyp - had D&C 09/2018 - no further follow up recommended DEXA: 01/2018 T - 0.7  NORMAL  Discussed new shingles vaccine  Names of Other Physician/Practitioners you currently use:  [facility] here for primary care   Dr. Kemper Durie, dentist, last visit 2021, goes q59m  Patient Care Team: [name], MD as PCP  - General (Internal Medicine) Melissa Noon, [place] as Referring Physician (Optometry) Justice Britain, MD as Consulting Physician (Orthopedic Surgery)  SURGICAL HISTORY She  has a past surgical history that includes Cesarean section (x3   last one 10/ 1988); Knee arthroscopy with medial menisectomy (Left, 02/25/2014); Cholecystectomy open (1979); Cataract extraction w/ intraocular lens  implant, bilateral (07/2009); Toe Surgery (1990); Cardiac catheterization (11/25/2004  dr Lia Foyer); Cardiac catheterization (10/10/2002   dr Lyndel Safe); and Dilatation & curettage/hysteroscopy with myosure (N/A, 10/05/2018). FAMILY HISTORY Her family history includes Arthritis in her sister; COPD in her mother; Colon cancer in her maternal grandfather; Berenice Primas' disease in her sister; Heart attack in her father; Heart disease in her father; Hypertension in her mother and sister; Thyroid disease in her mother and sister. SOCIAL HISTORY She  reports that she has never smoked. She has never used smokeless tobacco. She reports current alcohol use. She reports that she does not use drugs.   MEDICARE WELLNESS OBJECTIVES: Physical activity: Current Exercise Habits: Home exercise routine (walking at work), Type of exercise: walking, Intensity: Mild Cardiac risk factors: Cardiac Risk Factors include: advanced age (>12men, >54 women);dyslipidemia;hypertension;obesity (BMI >30kg/m2);sedentary lifestyle;diabetes mellitus Depression/mood screen:   Depression screen [facility] 2/9 02/13/2020  Decreased Interest 0  Down, Depressed, Hopeless 0  PHQ - 2 Score 0    ADLs:  In your present state of health, do you have any difficulty performing the following activities: 02/13/2020 11/10/2019  Hearing? N N  Vision? N N  Difficulty concentrating or making decisions? N N  Walking or climbing stairs? N N  Dressing or bathing? N N  Doing errands, shopping? N N  Some recent data might be hidden     Cognitive Testing  Alert? Yes  Normal  Appearance?Yes  Oriented to person? Yes  Place? Yes   Time? Yes  Recall of three objects?  Yes  Can perform simple calculations? Yes  Displays appropriate judgment?Yes  Can read the correct time from a watch face?Yes  EOL planning: Does Patient Have a Medical Advance Directive?: Yes Type of Advance Directive: Healthcare Power of Attorney, Living will Does patient want to make changes to medical advance directive?: No - Patient declined Copy of [place] in Chart?: No - copy requested  Review of Systems  Constitutional: Negative for malaise/fatigue and weight loss.  HENT: Negative for hearing loss and tinnitus.   Eyes: Negative for blurred vision and double vision.  Respiratory: Negative for cough, sputum production, shortness of breath and wheezing.   Cardiovascular: Negative for chest pain, palpitations, orthopnea, claudication, leg swelling and PND.  Gastrointestinal: Negative for abdominal pain, blood in stool, constipation, diarrhea, heartburn, melena, nausea and vomiting.  Genitourinary: Negative.   Musculoskeletal: Negative for falls, joint pain and myalgias.  Skin: Negative for rash.  Neurological: Negative for dizziness, tingling,  sensory change, weakness and headaches.  Endo/Heme/Allergies: Negative for polydipsia.  Psychiatric/Behavioral: Negative.  Negative for depression, memory loss, substance abuse and suicidal ideas. The patient is not nervous/anxious and does not have insomnia.   All other systems reviewed and are negative.    Objective:     Today's Vitals   02/13/20 0850  BP: 140/76  Pulse: 64  Resp: 16  Temp: (!) 97.1 F (36.2 C)  Weight: 220 lb (99.8 kg)  Height: 5\' 2"  (1.575 m)   Body mass index is 40.24 kg/m.  General appearance: alert, no distress, WD/WN, female HEENT: normocephalic, sclerae anicteric, TMs pearly, nares patent, no discharge or erythema, pharynx normal Oral cavity: MMM, no lesions Neck: supple, no  lymphadenopathy, no thyromegaly, no masses Heart: RRR, normal S1, S2, no murmurs Lungs: CTA bilaterally, no wheezes, rhonchi, or rales Abdomen: +bs, soft, non tender, non distended, no masses, no hepatomegaly, no splenomegaly Musculoskeletal: nontender, no swelling, no obvious deformity Extremities: no edema, no cyanosis, no clubbing Pulses: 2+ symmetric, upper and lower extremities, normal cap refill Neurological: alert, oriented x 3, CN2-12 intact, strength normal upper extremities and lower extremities, sensation normal throughout, DTRs 2+ throughout, no cerebellar signs, gait normal Psychiatric: normal affect, behavior normal, pleasant   Medicare Attestation I have personally reviewed: The patient's medical and social history Their use of alcohol, tobacco or illicit drugs Their current medications and supplements The patient's functional ability including ADLs,fall risks, home safety risks, cognitive, and hearing and visual impairment Diet and physical activities Evidence for depression or mood disorders  The patient's weight, height, BMI, and visual acuity have been recorded in the chart.  I have made referrals, counseling, and provided education to the patient based on review of the above and I have provided the patient with a written personalized care plan for preventive services.     Vicie Mutters, PA-C   02/14/2020

## 2020-02-13 ENCOUNTER — Ambulatory Visit: Payer: Medicare PPO | Admitting: Physician Assistant

## 2020-02-13 ENCOUNTER — Other Ambulatory Visit: Payer: Self-pay

## 2020-02-13 VITALS — BP 140/76 | HR 64 | Temp 97.1°F | Resp 16 | Ht 62.0 in | Wt 220.0 lb

## 2020-02-13 DIAGNOSIS — K649 Unspecified hemorrhoids: Secondary | ICD-10-CM

## 2020-02-13 DIAGNOSIS — Z0000 Encounter for general adult medical examination without abnormal findings: Secondary | ICD-10-CM

## 2020-02-13 DIAGNOSIS — I5181 Takotsubo syndrome: Secondary | ICD-10-CM | POA: Diagnosis not present

## 2020-02-13 DIAGNOSIS — E785 Hyperlipidemia, unspecified: Secondary | ICD-10-CM | POA: Diagnosis not present

## 2020-02-13 DIAGNOSIS — N182 Chronic kidney disease, stage 2 (mild): Secondary | ICD-10-CM | POA: Diagnosis not present

## 2020-02-13 DIAGNOSIS — I10 Essential (primary) hypertension: Secondary | ICD-10-CM | POA: Diagnosis not present

## 2020-02-13 DIAGNOSIS — I129 Hypertensive chronic kidney disease with stage 1 through stage 4 chronic kidney disease, or unspecified chronic kidney disease: Secondary | ICD-10-CM

## 2020-02-13 DIAGNOSIS — E559 Vitamin D deficiency, unspecified: Secondary | ICD-10-CM

## 2020-02-13 DIAGNOSIS — Z0001 Encounter for general adult medical examination with abnormal findings: Secondary | ICD-10-CM

## 2020-02-13 DIAGNOSIS — K219 Gastro-esophageal reflux disease without esophagitis: Secondary | ICD-10-CM | POA: Diagnosis not present

## 2020-02-13 DIAGNOSIS — IMO0001 Reserved for inherently not codable concepts without codable children: Secondary | ICD-10-CM

## 2020-02-13 DIAGNOSIS — Z79899 Other long term (current) drug therapy: Secondary | ICD-10-CM

## 2020-02-13 DIAGNOSIS — R6889 Other general symptoms and signs: Secondary | ICD-10-CM | POA: Diagnosis not present

## 2020-02-13 DIAGNOSIS — E1169 Type 2 diabetes mellitus with other specified complication: Secondary | ICD-10-CM

## 2020-02-13 DIAGNOSIS — E1122 Type 2 diabetes mellitus with diabetic chronic kidney disease: Secondary | ICD-10-CM | POA: Diagnosis not present

## 2020-02-13 DIAGNOSIS — M159 Polyosteoarthritis, unspecified: Secondary | ICD-10-CM | POA: Diagnosis not present

## 2020-02-13 NOTE — Patient Instructions (Addendum)
Please bring a copy of your living will or health care power of attorney so we can scan it in  .General eating tips  What to Avoid . Avoid added sugars o Often added sugar can be found in processed foods such as many condiments, dry cereals, cakes, cookies, chips, crisps, crackers, candies, sweetened drinks, etc.  o Read labels and AVOID/DECREASE use of foods with the following in their ingredient list: Sugar, fructose, high fructose corn syrup, sucrose, glucose, maltose, dextrose, molasses, cane sugar, brown sugar, any type of syrup, agave nectar, etc.   . Avoid snacking in between meals- drink water or if you feel you need a snack, pick a high water content snack such as cucumbers, watermelon, or any veggie.  Marland Kitchen Avoid foods made with flour o If you are going to eat food made with flour, choose those made with whole-grains; and, minimize your consumption as much as is tolerable . Avoid processed foods o These foods are generally stocked in the middle of the grocery store.  o Focus on shopping on the perimeter of the grocery.  What to Include . Vegetables o GREEN LEAFY VEGETABLES: Kale, spinach, mustard greens, collard greens, cabbage, broccoli, etc. o OTHER: Asparagus, cauliflower, eggplant, carrots, peas, Brussel sprouts, tomatoes, bell peppers, zucchini, beets, cucumbers, etc. . Grains, seeds, and legumes o Beans: kidney beans, black eyed peas, garbanzo beans, black beans, pinto beans, etc. o Whole, unrefined grains: brown rice, barley, bulgur, oatmeal, etc. . Healthy fats  o Avoid highly processed fats such as vegetable oil o Examples of healthy fats: avocado, olives, virgin olive oil, dark chocolate (?72% Cocoa), nuts (peanuts, almonds, walnuts, cashews, pecans, etc.) o Please still do small amount of these healthy fats, they are dense in calories.  . Low - Moderate Intake of Animal Sources of Protein o Meat sources: chicken, [place], salmon, tuna. Limit to 4 ounces of meat at one time  or the size of your palm. o Consider limiting dairy sources, but when choosing dairy focus on: PLAIN [place] yogurt, cottage cheese, high-protein milk . Fruit o Choose berries   If I told you I had a single pill that would help you with everything listed below and more, would you be interested? . decrease stress by improving anxiety and depression . help you achieve a healthy weight . give you more energy . make you more productive . help you focus . decrease your risk of dementia/heart attack/stroke/falls . improve your bone health  These are just some of the benefits that exercise brings to you.   IT IS WORTH carving out some time every day to fit in exercise. It will help in every aspect of your health. Even if you have injuries that prevent you from participating in a type of exercise you used to do; there is always something that you can do to keep exercise a part of your life. If improving your health is important, make exercise your priority. It is worth the time! If you have questions about the type of exercise that is right for you, please talk with me about this!  EXERCISE IS MEDICINE!  Benefits of Exercise  Reduces breast cancer onset and recurrence by 50% Lowers risk of colon cancer by 66% Reduces the risk of Alzheimer's by almost 50% Reduces heart disease and high blood pressure by almost 50% Lowers risk of stroke by 33% Lowers risk of Type II Diabetes Mellitus by over 60% Treats depression as well as medication or cognitive behavioral therapy  Exercising to Stay Healthy  Exercising regularly is important. It has many health benefits, such as:  Improving your overall fitness, flexibility, and endurance.  Increasing your bone density.  Helping with weight control.  Decreasing your body fat.  Increasing your muscle strength.  Reducing stress and tension.  Improving your overall health.   In order to become healthy and stay healthy, it is recommended  that you do moderate-intensity and vigorous-intensity exercise. You can tell that you are exercising at a moderate intensity if you have a higher heart rate and faster breathing, but you are still able to hold a conversation. You can tell that you are exercising at a vigorous intensity if you are breathing much harder and faster and cannot hold a conversation while exercising. How often should I exercise? Choose an activity that you enjoy and set realistic goals. Your health care provider can help you to make an activity plan that works for you. Exercise regularly as directed by your health care provider. This may include:  Doing resistance training twice each week, such as: ? Push-ups. ? Sit-ups. ? Lifting weights. ? Using resistance bands.  Doing a given intensity of exercise for a given amount of time. Choose from these options: ? 150 minutes of moderate-intensity exercise every week. ? 75 minutes of vigorous-intensity exercise every week. ? A mix of moderate-intensity and vigorous-intensity exercise every week.   Children, pregnant women, people who are out of shape, people who are overweight, and older adults may need to consult a health care provider for individual recommendations. If you have any sort of medical condition, be sure to consult your health care provider before starting a new exercise program. What are some exercise ideas? Some moderate-intensity exercise ideas include:  Walking at a rate of 1 mile in 15 minutes.  Biking.  Hiking.  Golfing.  Dancing.   Some vigorous-intensity exercise ideas include:  Walking at a rate of at least 4.5 miles per hour.  Jogging or running at a rate of 5 miles per hour.  Biking at a rate of at least 10 miles per hour.  Lap swimming.  Roller-skating or in-line skating.  Cross-country skiing.  Vigorous competitive sports, such as football, basketball, and soccer.  Jumping rope.  Aerobic dancing.   What are some everyday  activities that can help me to get exercise?  [place] work, such as: ? Pushing a Conservation officer, nature. ? Raking and bagging leaves.  Washing and waxing your car.  Pushing a stroller.  Shoveling snow.  Gardening.  Washing windows or floors. How can I be more active in my day-to-day activities?  Use the stairs instead of the elevator.  Take a walk during your lunch break.  If you drive, park your car farther away from work or school.  If you take public transportation, get off one stop early and walk the rest of the way.  Make all of your phone calls while standing up and walking around.  Get up, stretch, and walk around every 30 minutes throughout the day. What guidelines should I follow while exercising?  Do not exercise so much that you hurt yourself, feel dizzy, or get very short of breath.  Consult your health care provider before starting a new exercise program.  Wear comfortable clothes and shoes with good support.  Drink plenty of water while you exercise to prevent dehydration or heat stroke. Body water is lost during exercise and must be replaced.  Work out until you breathe faster and your heart  beats faster. This information is not intended to replace advice given to you by your health care provider. Make sure you discuss any questions you have with your health care provider.   Ask insurance and pharmacy about shingrix - it is a 2 part shot that we will not be getting in the office.   Suggest getting AFTER covid vaccines, have to wait at least a month This shot can make you feel bad due to such good immune response it can trigger some inflammation so take tylenol or aleve day of or day after and plan on resting.   Can go to [email or web address] for more information  Shingrix Vaccination  Two vaccines are licensed and recommended to prevent shingles in the [place] Zoster vaccine live (ZVL, Zostavax) has been in use since  2006. Recombinant zoster vaccine (RZV, Shingrix), has been in use since 2017 and is recommended by [employer] as the preferred shingles vaccine.  What Everyone Should Know about Shingles Vaccine (Shingrix) One of the Recommended Vaccines by Disease Shingles vaccination is the only way to protect against shingles and postherpetic neuralgia (PHN), the most common complication from shingles. CDC recommends that healthy adults 50 years and older get two doses of the shingles vaccine called Shingrix (recombinant zoster vaccine), separated by 2 to 6 months, to prevent shingles and the complications from the disease. Your doctor or pharmacist can give you Shingrix as a shot in your upper arm. Shingrix provides strong protection against shingles and PHN. Two doses of Shingrix is more than 90% effective at preventing shingles and PHN. Protection stays above 85% for at least the first four years after you get vaccinated. Shingrix is the preferred vaccine, over Zostavax (zoster vaccine live), a shingles vaccine in use since 2006. Zostavax may still be used to prevent shingles in healthy adults 60 years and older. For example, you could use Zostavax if a person is allergic to Shingrix, prefers Zostavax, or requests immediate vaccination and Shingrix is unavailable. Who Should Get Shingrix? Healthy adults 50 years and older should get two doses of Shingrix, separated by 2 to 6 months. You should get Shingrix even if in the past you . had shingles  . received Zostavax  . are not sure if you had chickenpox There is no maximum age for getting Shingrix. If you had shingles in the past, you can get Shingrix to help prevent future occurrences of the disease. There is no specific length of time that you need to wait after having shingles before you can receive Shingrix, but generally you should make sure the shingles rash has gone away before getting vaccinated. You can get Shingrix whether or not you remember having had  chickenpox in the past. Studies show that more than 99% of Americans 40 years and older have had chickenpox, even if they don't remember having the disease. Chickenpox and shingles are related because they are caused by the same virus (varicella zoster virus). After a person recovers from chickenpox, the virus stays dormant (inactive) in the body. It can reactivate years later and cause shingles. If you had Zostavax in the recent past, you should wait at least eight weeks before getting Shingrix. Talk to your healthcare provider to determine the best time to get Shingrix. Shingrix is available in [facility] and pharmacies. To find doctor's offices or pharmacies near you that offer the vaccine, visit [facility] Vaccine FinderExternal. If you have questions about Shingrix, talk with your healthcare provider. Vaccine for Those 50 Years and Older  Shingrix  reduces the risk of shingles and PHN by more than 90% in people 50 and older. CDC recommends the vaccine for healthy adults 22 and older.  Who Should Not Get Shingrix? You should not get Shingrix if you: . have ever had a severe allergic reaction to any component of the vaccine or after a dose of Shingrix  . tested negative for immunity to varicella zoster virus. If you test negative, you should get chickenpox vaccine.  . currently have shingles  . currently are pregnant or breastfeeding. Women who are pregnant or breastfeeding should wait to get Shingrix.  Marland Kitchen receive specific antiviral drugs (acyclovir, famciclovir, or valacyclovir) 24 hours before vaccination (avoid use of these antiviral drugs for 14 days after vaccination)- zoster vaccine live only If you have a minor acute (starts suddenly) illness, such as a cold, you may get Shingrix. But if you have a moderate or severe acute illness, you should usually wait until you recover before getting the vaccine. This includes anyone with a temperature of 101.41F or higher. The side effects of the  Shingrix are temporary, and usually last 2 to 3 days. While you may experience pain for a few days after getting Shingrix, the pain will be less severe than having shingles and the complications from the disease. How Well Does Shingrix Work? Two doses of Shingrix provides strong protection against shingles and postherpetic neuralgia (PHN), the most common complication of shingles. . In adults 7 to 70 years old who got two doses, Shingrix was 97% effective in preventing shingles; among adults 70 years and older, Shingrix was 91% effective.  . In adults 79 to 70 years old who got two doses, Shingrix was 91% effective in preventing PHN; among adults 70 years and older, Shingrix was 89% effective. Shingrix protection remained high (more than 85%) in people 70 years and older throughout the four years following vaccination. Since your risk of shingles and PHN increases as you get older, it is important to have strong protection against shingles in your older years. Top of Page  What Are the Possible Side Effects of Shingrix? Studies show that Shingrix is safe. The vaccine helps your body create a strong defense against shingles. As a result, you are likely to have temporary side effects from getting the shots. The side effects may affect your ability to do normal daily activities for 2 to 3 days. Most people got a sore arm with mild or moderate pain after getting Shingrix, and some also had redness and swelling where they got the shot. Some people felt tired, had muscle pain, a headache, shivering, fever, stomach pain, or nausea. About 1 out of 6 people who got Shingrix experienced side effects that prevented them from doing regular activities. Symptoms went away on their own in about 2 to 3 days. Side effects were more common in younger people. You might have a reaction to the first or second dose of Shingrix, or both doses. If you experience side effects, you may choose to take over-the-counter pain medicine  such as ibuprofen or acetaminophen. If you experience side effects from Shingrix, you should report them to the Vaccine Adverse Event Reporting System (VAERS). Your doctor might file this report, or you can do it yourself through the VAERS websiteExternal, or by calling [phone]. If you have any questions about side effects from Shingrix, talk with your doctor. The shingles vaccine does not contain thimerosal (a preservative containing mercury). Top of Page  When Should I See a Doctor Because of  the Side Effects I Experience From Shingrix? In clinical trials, Shingrix was not associated with serious adverse events. In fact, serious side effects from vaccines are extremely rare. For example, for every 1 million doses of a vaccine given, only one or two people may have a severe allergic reaction. Signs of an allergic reaction happen within minutes or hours after vaccination and include hives, swelling of the face and throat, difficulty breathing, a fast heartbeat, dizziness, or weakness. If you experience these or any other life-threatening symptoms, see a doctor right away. Shingrix causes a strong response in your immune system, so it may produce short-term side effects more intense than you are used to from other vaccines. These side effects can be uncomfortable, but they are expected and usually go away on their own in 2 or 3 days. Top of Page  How Can I Pay For Shingrix? There are several ways shingles vaccine may be paid for: Medicare . Medicare Part D plans cover the shingles vaccine, but there may be a cost to you depending on your plan. There may be a copay for the vaccine, or you may need to pay in full then get reimbursed for a certain amount.  . Medicare Part B does not cover the shingles vaccine. Medicaid . Medicaid may or may not cover the vaccine. Contact your insurer to find out. Private health insurance . Many private health insurance plans will cover the vaccine, but there may  be a cost to you depending on your plan. Contact your insurer to find out. Vaccine assistance programs . Some pharmaceutical companies provide vaccines to eligible adults who cannot afford them. You may want to check with the vaccine manufacturer, [employer], about Shingrix. If you do not currently have health insurance, learn more about affordable health coverage optionsExternal. To find doctor's offices or pharmacies near you that offer the vaccine, visit [facility] Vaccine FinderExternal.

## 2020-02-14 ENCOUNTER — Encounter: Payer: Self-pay | Admitting: Physician Assistant

## 2020-02-14 DIAGNOSIS — M25511 Pain in right shoulder: Secondary | ICD-10-CM | POA: Diagnosis not present

## 2020-02-14 LAB — HEMOGLOBIN A1C
Hgb A1c MFr Bld: 6.2 % of total Hgb — ABNORMAL HIGH (ref <5.7)
Mean Plasma Glucose: 131 (calc)
eAG (mmol/L): 7.3 (calc)

## 2020-02-14 LAB — CBC WITH DIFFERENTIAL/PLATELET
Absolute Monocytes: 403 cells/uL (ref 200–950)
Basophils Absolute: 59 cells/uL (ref 0–200)
Basophils Relative: 0.9 %
Eosinophils Absolute: 189 cells/uL (ref 15–500)
Eosinophils Relative: 2.9 %
HCT: 44.3 % (ref 35.0–45.0)
Hemoglobin: 14.3 g/dL (ref 11.7–15.5)
Lymphs Abs: 1443 cells/uL (ref 850–3900)
MCH: 28 pg (ref 27.0–33.0)
MCHC: 32.3 g/dL (ref 32.0–36.0)
MCV: 86.7 fL (ref 80.0–100.0)
MPV: 11.6 fL (ref 7.5–12.5)
Monocytes Relative: 6.2 %
Neutro Abs: 4407 cells/uL (ref 1500–7800)
Neutrophils Relative %: 67.8 %
Platelets: 231 10*3/uL (ref 140–400)
RBC: 5.11 10*6/uL — ABNORMAL HIGH (ref 3.80–5.10)
RDW: 13.6 % (ref 11.0–15.0)
Total Lymphocyte: 22.2 %
WBC: 6.5 10*3/uL (ref 3.8–10.8)

## 2020-02-14 LAB — COMPLETE METABOLIC PANEL WITH GFR
AG Ratio: 2.1 (calc) (ref 1.0–2.5)
ALT: 28 U/L (ref 6–29)
AST: 21 U/L (ref 10–35)
Albumin: 4.8 g/dL (ref 3.6–5.1)
Alkaline phosphatase (APISO): 74 U/L (ref 37–153)
BUN: 13 mg/dL (ref 7–25)
CO2: 27 mmol/L (ref 20–32)
Calcium: 9.9 mg/dL (ref 8.6–10.4)
Chloride: 102 mmol/L (ref 98–110)
Creat: 0.69 mg/dL (ref 0.50–0.99)
GFR, Est African American: 103 mL/min/{1.73_m2} (ref >=60)
GFR, Est Non African American: 89 mL/min/{1.73_m2} (ref >=60)
Globulin: 2.3 g/dL (calc) (ref 1.9–3.7)
Glucose, Bld: 215 mg/dL — ABNORMAL HIGH (ref 65–99)
Potassium: 4.5 mmol/L (ref 3.5–5.3)
Sodium: 140 mmol/L (ref 135–146)
Total Bilirubin: 0.9 mg/dL (ref 0.2–1.2)
Total Protein: 7.1 g/dL (ref 6.1–8.1)

## 2020-02-14 LAB — LIPID PANEL
Cholesterol: 130 mg/dL (ref <200)
HDL: 55 mg/dL (ref >=50)
LDL Cholesterol (Calc): 47 mg/dL (calc)
Non-HDL Cholesterol (Calc): 75 mg/dL (calc) (ref <130)
Total CHOL/HDL Ratio: 2.4 (calc) (ref <5.0)
Triglycerides: 224 mg/dL — ABNORMAL HIGH (ref <150)

## 2020-02-14 LAB — TSH: TSH: 3.24 mIU/L (ref 0.40–4.50)

## 2020-03-27 DIAGNOSIS — M25511 Pain in right shoulder: Secondary | ICD-10-CM | POA: Diagnosis not present

## 2020-03-31 ENCOUNTER — Other Ambulatory Visit: Payer: Self-pay | Admitting: *Deleted

## 2020-03-31 MED ORDER — METFORMIN HCL ER 500 MG PO TB24: ORAL_TABLET | ORAL | 3 refills | Status: DC

## 2020-04-15 DIAGNOSIS — M79672 Pain in left foot: Secondary | ICD-10-CM | POA: Diagnosis not present

## 2020-05-27 ENCOUNTER — Encounter: Payer: Self-pay | Admitting: Adult Health Nurse Practitioner

## 2020-05-27 ENCOUNTER — Other Ambulatory Visit: Payer: Self-pay

## 2020-05-27 ENCOUNTER — Ambulatory Visit: Payer: Medicare PPO | Admitting: Adult Health Nurse Practitioner

## 2020-05-27 VITALS — BP 126/84 | HR 95 | Temp 97.5°F | Wt 221.0 lb

## 2020-05-27 DIAGNOSIS — E1169 Type 2 diabetes mellitus with other specified complication: Secondary | ICD-10-CM | POA: Diagnosis not present

## 2020-05-27 DIAGNOSIS — E559 Vitamin D deficiency, unspecified: Secondary | ICD-10-CM

## 2020-05-27 DIAGNOSIS — E785 Hyperlipidemia, unspecified: Secondary | ICD-10-CM

## 2020-05-27 DIAGNOSIS — I129 Hypertensive chronic kidney disease with stage 1 through stage 4 chronic kidney disease, or unspecified chronic kidney disease: Secondary | ICD-10-CM

## 2020-05-27 DIAGNOSIS — N182 Chronic kidney disease, stage 2 (mild): Secondary | ICD-10-CM

## 2020-05-27 DIAGNOSIS — I5181 Takotsubo syndrome: Secondary | ICD-10-CM | POA: Diagnosis not present

## 2020-05-27 DIAGNOSIS — Z1159 Encounter for screening for other viral diseases: Secondary | ICD-10-CM

## 2020-05-27 DIAGNOSIS — E1122 Type 2 diabetes mellitus with diabetic chronic kidney disease: Secondary | ICD-10-CM | POA: Diagnosis not present

## 2020-05-27 DIAGNOSIS — IMO0001 Reserved for inherently not codable concepts without codable children: Secondary | ICD-10-CM

## 2020-05-27 DIAGNOSIS — M159 Polyosteoarthritis, unspecified: Secondary | ICD-10-CM | POA: Diagnosis not present

## 2020-05-27 DIAGNOSIS — K649 Unspecified hemorrhoids: Secondary | ICD-10-CM

## 2020-05-27 DIAGNOSIS — K219 Gastro-esophageal reflux disease without esophagitis: Secondary | ICD-10-CM

## 2020-05-27 DIAGNOSIS — I10 Essential (primary) hypertension: Secondary | ICD-10-CM

## 2020-05-27 NOTE — Progress Notes (Signed)
FOLLOW UP 6 MONTH  Assessment / Plan:   Diagnoses and all orders for this visit:  Takotsubo syndrome No symptoms for over 5 years Has been released by cardiology  Stress-induced cardiomyopathy  Has been released by cardiology; monitor    Hyperlipidemia associated with type 2 diabetes mellitus ([place]) Continue medications: rosuvastatin 20mg  nightly Discussed dietary and exercise modifications Low fat diet -     Lipid panel   Type 2 diabetes mellitus with stage 2 chronic kidney disease, without long-term current use of insulin (HCC) Continue medications: Metformin 500mg SR two tablets twice a day with meals. Education: Reviewed ABCs of diabetes management (respective goals in parentheses):  A1C (<7), blood pressure (<130/80), and cholesterol (LDL <70) Eye Exam yearly and Dental Exam every 6 months- up to date metformin 4 tabs daily  Dietary recommendations Physical Activity recommendations Foot exam up to date   Essential hypertension Continue medications: bisoprolol-HCTZ 10-6.25mg  daily in am Monitor blood pressure at home; call if consistently over 130/80 Continue DASH diet.   Reminder to go to the ER if any CP, SOB, nausea, dizziness, severe HA, changes vision/speech, left arm numbness and tingling and jaw pain.  Hemorrhoids, unspecified hemorrhoid type Asymptomatic at this time; use anusol PRN  DJD Managed by OTC analgesics Followed by [facility] Dr. Gladstone Lighter, Dr. Veverly Fells Weight loss advised  Vitamin D deficiency Continue supplementation Check vitamin D level q 6 months or as needed  Morbid obesity (BMI 40)  Long discussion about weight loss, diet, and exercise Recommended diet heavy in fruits and veggies and low in animal meats, cheeses, and dairy products, appropriate calorie intake Discussed appropriate weight for height, she would prefer not to set weight goal, working on general good habits with goal to normalize A1C and get off meds Follow up at next  visit  Medication management CBC, CMP/GFR, magnesium   Gastroesophageal reflux disease, esophagitis presence not specified Well managed on current medications Discussed diet, avoiding triggers and other lifestyle changes   diabetes mellitus with chronic kidney disease and hypertension ([place]) -     Hemoglobin A1c  CKD stage 2 due to type 2 diabetes mellitus ([place]) -     COMPLETE METABOLIC PANEL WITH GFR Increase fluids, avoid NSAIDS, monitor sugars, will monitor   Over 40 minutes of face to face interview, exam, counseling, chart review and critical decision making was performed.  Future Appointments  Date Time Provider [place]  11/17/2020  3:00 PM [name], MD [facility] None  02/18/2021  9:00 AM Garnet Sierras, NP [facility] None      Subjective:  [name] is a 69 y.o. female who presents for 16month follow up.   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 Has been released by cardiology. We are monitoring    Established with [facility] for her left foot, Dr Gladstone Lighter.  Reports arthritis, Improved.   She has a diagnosis of GERD which is currently doing well off of medications.  She reports symptoms is currently well controlled, and denies breakthrough reflux, burning in chest, hoarseness or cough.    BMI is Body mass index is 40.42 kg/m., she is still trying to be active.  Wt Readings from Last 3 Encounters:  05/27/20 221 lb (100.2 kg)  02/13/20 220 lb (99.8 kg)  11/11/19 219 lb 12.8 oz (99.7 kg)   She does not check BP at home,  today their BP is BP: 126/84 She does not workout. She denies chest pain, shortness of breath, dizziness.   She is on cholesterol medication (rosuvastatin 20 mg daily) and denies myalgias. Her cholesterol is at goal less than 70. The cholesterol last visit was:   Lab Results  Component Value Date   CHOL 130 02/13/2020    HDL 55 02/13/2020   LDLCALC 47 02/13/2020   TRIG 224 (H) 02/13/2020   CHOLHDL 2.4 02/13/2020    She has not been working on diet and exercise for T2DM well controlled by metformin, DEE Dr. Delman Cheadle 08/2019 abstracted and denies foot ulcerations, increased appetite, nausea, paresthesia of the feet, polydipsia, polyuria, visual disturbances, vomiting and weight loss. She does have glucometer but hasn't been checking due to recent very well controlled values. Last A1C in the office was:  Lab Results  Component Value Date   HGBA1C 6.2 (H) 02/13/2020   Last GFR: Lab Results  Component Value Date   GFRNONAA 89 02/13/2020   Patient is on Vitamin D supplement and at goal at recent check:    Lab Results  Component Value Date   VD25OH 91 11/11/2019      Medication Review:  Current Outpatient Medications (Endocrine & Metabolic):    metFORMIN (GLUCOPHAGE-XR) 500 MG 24 hr tablet, Take 2 tablets 2 x /day with Meals for Diabetes  Current Outpatient Medications (Cardiovascular):    bisoprolol-hydrochlorothiazide (ZIAC) 10-6.25 MG tablet, Take 1 tablet Daily for BP   losartan (COZAAR) 50 MG tablet, TAKE 1 TABLET(50 MG) BY MOUTH DAILY   rosuvastatin (CRESTOR) 20 MG tablet, Take 1 tablet Daily for Cholesterol   Current Outpatient Medications (Analgesics):    aspirin EC 81 MG tablet, Take 81 mg by mouth daily.   Current Outpatient Medications (Other):    Cholecalciferol (VITAMIN D-3) 5000 UNITS TABS, Take 2 tablets by mouth every morning.   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 diabetes mellitus ([place]) 01/02/2019   Acid reflux 12/11/2017   Takotsubo syndrome 07/16/2015   Morbid obesity ([place]))  06/02/2014   Osteoarthritis of left knee 02/25/2014   Hyperlipidemia associated with type 2 diabetes mellitus ([place]) 12/08/2013   Vitamin D deficiency 12/08/2013   Medication management 12/08/2013    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 ([place]) 09/13/2013   Stress-induced cardiomyopathy 11/18/2010   Essential hypertension 10/29/2007   Hemorrhoids 10/29/2007   DJD 10/29/2007    Screening Tests Immunization History  Administered Date(s) Administered   Influenza, High Dose Seasonal PF 04/30/2015, 05/23/2017, 05/16/2020   Influenza-Unspecified 04/15/2013, 05/06/2014, 04/29/2019   PFIZER SARS-COV-2 Vaccination 10/19/2019, 11/09/2019, 05/16/2020   Pneumococcal Conjugate-13 11/01/2016   Pneumococcal Polysaccharide-23 04/30/2015   Tdap 03/11/2014   Zoster 05/23/2012   Health Maintenance  Topic Date Due   DEXA SCAN  02/04/2020   MAMMOGRAM  08/05/2020   HEMOGLOBIN A1C  08/15/2020   OPHTHALMOLOGY EXAM  08/28/2020   FOOT EXAM  02/12/2021   TETANUS/TDAP  03/11/2024   COLONOSCOPY  01/22/2025   INFLUENZA VACCINE  Completed   COVID-19 Vaccine  Completed   Hepatitis C Screening  Completed   PNA vac Low Risk Adult  Completed   Preventative care: Last colonoscopy: 2016 due 2026 Pap: 06/2018 by Dr. Phineas Real - endometrial polyp - had D&C 09/2018 - no further follow up recommended DEXA: 01/2018 T - 0.7  NORMAL   Discussed Shingrinx vaccination  Names  of Other Physician/Practitioners you currently use:  [facility] here for primary care   Dr. Kemper Durie, dentist, last visit 2021, goes q21m Eye Exam: Dr Delman Cheadle 2021 complete  Patient Care Team: [name], MD as PCP - General (Internal Medicine) Melissa Noon, [place] as Referring Physician (Optometry) Justice Britain, MD as Consulting Physician (Orthopedic Surgery)  SURGICAL HISTORY She  has a past surgical history that includes Cesarean section (x3   last one 10/ 1988); Knee arthroscopy with medial menisectomy (Left, 02/25/2014); Cholecystectomy open (1979); Cataract extraction w/ intraocular lens  implant, bilateral (07/2009); Toe Surgery (1990);  Cardiac catheterization (11/25/2004  dr Lia Foyer); Cardiac catheterization (10/10/2002   dr Lyndel Safe); and Dilatation & curettage/hysteroscopy with myosure (N/A, 10/05/2018). FAMILY HISTORY Her family history includes Arthritis in her sister; COPD in her mother; Colon cancer in her maternal grandfather; Berenice Primas' disease in her sister; Heart attack in her father; Heart disease in her father; Hypertension in her mother and sister; Thyroid disease in her mother and sister. SOCIAL HISTORY She  reports that she has never smoked. She has never used smokeless tobacco. She reports current alcohol use. She reports that she does not use drugs.   Review of Systems  Constitutional: Negative for malaise/fatigue and weight loss.  HENT: Negative for hearing loss and tinnitus.   Eyes: Negative for blurred vision and double vision.  Respiratory: Negative for cough, sputum production, shortness of breath and wheezing.   Cardiovascular: Negative for chest pain, palpitations, orthopnea, claudication, leg swelling and PND.  Gastrointestinal: Negative for abdominal pain, blood in stool, constipation, diarrhea, heartburn, melena, nausea and vomiting.  Genitourinary: Negative.   Musculoskeletal: Negative for falls, joint pain and myalgias.  Skin: Negative for rash.  Neurological: Negative for dizziness, tingling, sensory change, weakness and headaches.  Endo/Heme/Allergies: Negative for polydipsia.  Psychiatric/Behavioral: Negative.  Negative for depression, memory loss, substance abuse and suicidal ideas. The patient is not nervous/anxious and does not have insomnia.   All other systems reviewed and are negative.    Objective:     Today's Vitals   05/27/20 0951  BP: 126/84  Pulse: 95  Temp: (!) 97.5 F (36.4 C)  SpO2: 98%  Weight: 221 lb (100.2 kg)   Body mass index is 40.42 kg/m.  General appearance: alert, no distress, WD/WN, female HEENT: normocephalic, sclerae anicteric, TMs pearly, nares patent, no  discharge or erythema, pharynx normal Oral cavity: MMM, no lesions Neck: supple, no lymphadenopathy, no thyromegaly, no masses Heart: RRR, normal S1, S2, no murmurs Lungs: CTA bilaterally, no wheezes, rhonchi, or rales Abdomen: +bs, soft, non tender, non distended, no masses, no hepatomegaly, no splenomegaly Musculoskeletal: nontender, no swelling, no obvious deformity Extremities: no edema, no cyanosis, no clubbing Pulses: 2+ symmetric, upper and lower extremities, normal cap refill Neurological: alert, oriented x 3, CN2-12 intact, strength normal upper extremities and lower extremities, sensation normal throughout, DTRs 2+ throughout, no cerebellar signs, gait normal Psychiatric: normal affect, behavior normal, pleasant      Garnet Sierras, NP   05/27/2020

## 2020-05-28 LAB — CBC WITH DIFFERENTIAL/PLATELET
Absolute Monocytes: 460 cells/uL (ref 200–950)
Basophils Absolute: 58 cells/uL (ref 0–200)
Basophils Relative: 0.8 %
Eosinophils Absolute: 168 cells/uL (ref 15–500)
Eosinophils Relative: 2.3 %
HCT: 41.8 % (ref 35.0–45.0)
Hemoglobin: 13.4 g/dL (ref 11.7–15.5)
Lymphs Abs: 1737 cells/uL (ref 850–3900)
MCH: 28.1 pg (ref 27.0–33.0)
MCHC: 32.1 g/dL (ref 32.0–36.0)
MCV: 87.6 fL (ref 80.0–100.0)
MPV: 11.5 fL (ref 7.5–12.5)
Monocytes Relative: 6.3 %
Neutro Abs: 4876 cells/uL (ref 1500–7800)
Neutrophils Relative %: 66.8 %
Platelets: 271 10*3/uL (ref 140–400)
RBC: 4.77 10*6/uL (ref 3.80–5.10)
RDW: 13.1 % (ref 11.0–15.0)
Total Lymphocyte: 23.8 %
WBC: 7.3 10*3/uL (ref 3.8–10.8)

## 2020-05-28 LAB — COMPLETE METABOLIC PANEL WITH GFR
AG Ratio: 2 (calc) (ref 1.0–2.5)
ALT: 30 U/L — ABNORMAL HIGH (ref 6–29)
AST: 19 U/L (ref 10–35)
Albumin: 4.4 g/dL (ref 3.6–5.1)
Alkaline phosphatase (APISO): 57 U/L (ref 37–153)
BUN: 13 mg/dL (ref 7–25)
CO2: 27 mmol/L (ref 20–32)
Calcium: 10.2 mg/dL (ref 8.6–10.4)
Chloride: 101 mmol/L (ref 98–110)
Creat: 0.69 mg/dL (ref 0.60–0.93)
GFR, Est African American: 102 mL/min/{1.73_m2} (ref >=60)
GFR, Est Non African American: 88 mL/min/{1.73_m2} (ref >=60)
Globulin: 2.2 g/dL (calc) (ref 1.9–3.7)
Glucose, Bld: 195 mg/dL — ABNORMAL HIGH (ref 65–99)
Potassium: 4.5 mmol/L (ref 3.5–5.3)
Sodium: 138 mmol/L (ref 135–146)
Total Bilirubin: 0.8 mg/dL (ref 0.2–1.2)
Total Protein: 6.6 g/dL (ref 6.1–8.1)

## 2020-05-28 LAB — HEMOGLOBIN A1C
Hgb A1c MFr Bld: 6.9 % of total Hgb — ABNORMAL HIGH (ref <5.7)
Mean Plasma Glucose: 151 (calc)
eAG (mmol/L): 8.4 (calc)

## 2020-05-28 LAB — LIPID PANEL
Cholesterol: 133 mg/dL (ref <200)
HDL: 48 mg/dL — ABNORMAL LOW (ref >=50)
LDL Cholesterol (Calc): 54 mg/dL (calc)
Non-HDL Cholesterol (Calc): 85 mg/dL (calc) (ref <130)
Total CHOL/HDL Ratio: 2.8 (calc) (ref <5.0)
Triglycerides: 262 mg/dL — ABNORMAL HIGH (ref <150)

## 2020-05-28 LAB — HEPATITIS C ANTIBODY
Hepatitis C Ab: NONREACTIVE
SIGNAL TO CUT-OFF: 0.01 (ref <1.00)

## 2020-08-06 DIAGNOSIS — Z1231 Encounter for screening mammogram for malignant neoplasm of breast: Secondary | ICD-10-CM | POA: Diagnosis not present

## 2020-08-06 LAB — HM MAMMOGRAPHY

## 2020-08-07 ENCOUNTER — Other Ambulatory Visit: Payer: Self-pay | Admitting: Internal Medicine

## 2020-08-07 DIAGNOSIS — E782 Mixed hyperlipidemia: Secondary | ICD-10-CM

## 2020-08-13 ENCOUNTER — Other Ambulatory Visit: Payer: Self-pay

## 2020-08-13 ENCOUNTER — Ambulatory Visit: Payer: Medicare PPO

## 2020-08-13 ENCOUNTER — Other Ambulatory Visit: Payer: Self-pay | Admitting: Adult Health Nurse Practitioner

## 2020-08-13 VITALS — BP 132/86 | HR 57 | Temp 96.4°F | Wt 224.0 lb

## 2020-08-13 DIAGNOSIS — Z20822 Contact with and (suspected) exposure to covid-19: Secondary | ICD-10-CM

## 2020-08-13 DIAGNOSIS — Z1152 Encounter for screening for COVID-19: Secondary | ICD-10-CM

## 2020-08-13 NOTE — Progress Notes (Signed)
Patient needs screening SARS-COV2-nasal swab, PCR, before returning to work.  Completed in office today.    [name], [name], DNP [facility] 08/13/2020  2:45 PM

## 2020-08-13 NOTE — Progress Notes (Signed)
Patient presents to the office for a nurse visit to have Covid PCR test done to return to work. No symptoms at this time and had taken a rapid test with it being negative. Vitals taken and recorded.

## 2020-08-15 LAB — SARS-COV-2 RNA,(COVID-19) QUALITATIVE NAAT: SARS CoV2 RNA: NOT DETECTED

## 2020-08-17 LAB — POC COVID19 BINAXNOW: SARS Coronavirus 2 Ag: NEGATIVE

## 2020-11-05 ENCOUNTER — Other Ambulatory Visit: Payer: Self-pay

## 2020-11-05 DIAGNOSIS — H26491 Other secondary cataract, right eye: Secondary | ICD-10-CM | POA: Diagnosis not present

## 2020-11-05 DIAGNOSIS — E119 Type 2 diabetes mellitus without complications: Secondary | ICD-10-CM | POA: Diagnosis not present

## 2020-11-05 DIAGNOSIS — H43813 Vitreous degeneration, bilateral: Secondary | ICD-10-CM | POA: Diagnosis not present

## 2020-11-05 DIAGNOSIS — H524 Presbyopia: Secondary | ICD-10-CM | POA: Diagnosis not present

## 2020-11-05 LAB — HM DIABETES EYE EXAM

## 2020-11-05 MED ORDER — LOSARTAN POTASSIUM 50 MG PO TABS: ORAL_TABLET | ORAL | 3 refills | Status: DC

## 2020-11-05 NOTE — Telephone Encounter (Signed)
Refill on losartan 50mg  tablets

## 2020-11-13 ENCOUNTER — Encounter: Payer: Self-pay | Admitting: *Deleted

## 2020-11-16 ENCOUNTER — Encounter: Payer: Self-pay | Admitting: Internal Medicine

## 2020-11-16 NOTE — Progress Notes (Signed)
Annual Screening/Preventative Visit & Comprehensive Evaluation &  Examination  Future Appointments  Date Time Provider [place]  11/17/2020  3:00 PM [name], MD [facility] None  02/18/2021  9:30 AM Garnet Sierras, NP [facility] None       This very nice 70 y.o. DWF presents for a Screening /Preventative Visit & comprehensive evaluation and management of multiple medical co-morbidities.  Patient has been followed for HTN, HLD, T2_NIDDM  and Vitamin D Deficiency.       HTN predates circa 1994. Patient's BP has been controlled at home and patient denies any cardiac symptoms as chest pain, palpitations, shortness of breath, dizziness or ankle swelling.  In 2004, patient was dx'd with & recovered from a Viral Myocarditis. Then in 2006, she had a normal Heart Cath and later a Negative Myoview in 2009. Today's BP is at goal - 120/76.       Patient's hyperlipidemia is controlled with diet and Rosuvastatin. Patient denies myalgias or other medication SE's. Last lipids were at goal except elevated Trig's:  Lab Results  Component Value Date   CHOL 133 05/27/2020   HDL 48 (L) 05/27/2020   LDLCALC 54 05/27/2020   TRIG 262 (H) 05/27/2020   CHOLHDL 2.8 05/27/2020        Patient has  Morbid Obesity (BMI 41+) and hx/o T2_NIDDM  (2007) w/CKD2 and patient denies reactive hypoglycemic symptoms, visual blurring, diabetic polys or paresthesias. Last A1c was not at goal:  Lab Results  Component Value Date   HGBA1C 6.9 (H) 05/27/2020       Finally, patient has history of Vitamin D Deficiency ("20" /2008)and last Vitamin D was  at goal:   Lab Results  Component Value Date   VD25OH 91 11/11/2019    Current Outpatient Medications on File Prior to Visit  Medication Sig  . VITAMIN C 1000 MG  Take daily.  Marland Kitchen aspirin EC 81 MG Take  daily.  . bisoprolol-hctz 10-6.25 MG  TAKE 1 TABLET  DAILY   . VITAMIN D 5000 UNITS TABS Take 2 tablets  every morning.  Marland Kitchen losartan  50 MG tablet TAKE  1 TABLET DAILY  . metFORMIN-XR) 500 MG  Take 2 tablets 2 x /day with Meals for Diabetes  . Multiple Vitamin  Take 1 tablet  daily.  . rosuvastatin 20 MG tablet TAKE 1 TABLET DAILY     No Known Allergies  Past Medical History:  Diagnosis Date  . Arthritis    hands, feet  . Atherosclerosis of coronary artery of native heart 09/2002   per cardiac cath 10-10-2002 mild non-obstructive of LAD  . CKD (chronic kidney disease), stage II   . Endometrial polyp   . Hemorrhoids   . History of  Takotsuku cardiomyopathy (resolved) in adulthood     per cath 10-10-2002  ef 25% with wall motion abnormalities;  follow-up cath 11-22-2004  resolution wall motion abnormalities and normal LVF;   echo 11-25-2010  ef 55%, mild LAE,  trivial MR, PR, TR  . HTN (hypertension)   . Hypercholesterolemia   . Right sided sciatica 12/12/2017  . Type 2 diabetes mellitus [facility])        Health Maintenance  Topic Date Due  . DEXA SCAN  02/04/2020  . HEMOGLOBIN A1C  11/25/2020  . FOOT EXAM  02/12/2021  . INFLUENZA VACCINE  03/15/2021  . MAMMOGRAM  08/06/2021  . OPHTHALMOLOGY EXAM  11/05/2021  . TETANUS/TDAP  03/11/2024  . COLONOSCOPY (Pts 45-2yrs Insurance coverage will need to  be confirmed)  01/22/2025  . COVID-19 Vaccine  Completed  . Hepatitis C Screening  Completed  . PNA vac Low Risk Adult  Completed  . HPV VACCINES  Aged Out    Immunization History  Administered Date(s) Administered  . Influenza, High Dose Seasonal PF 04/30/2015, 05/23/2017, 05/16/2020  . Influenza-Unspecified 04/15/2013, 05/06/2014, 04/29/2019  . PFIZER(Purple Top)SARS-COV-2 Vaccination 10/19/2019, 11/09/2019, 05/16/2020  . Pneumococcal Conjugate-13 11/01/2016  . Pneumococcal Polysaccharide-23 04/30/2015  . Tdap 03/11/2014  . Zoster 05/23/2012    Last Colon - 01/23/2015 - Dr Deatra Ina - Recc 10 yr  F/u  Colon  Last MGM - 08/06/2020   Past Surgical History:  Procedure Laterality Date  . CARDIAC CATHETERIZATION  11/25/2004  dr  Lia Foyer   mild non-obstrucitve coronary atherosclerosis involving LAD,  normal LVF and resolution wall abnormalities from previous cath  . CARDIAC CATHETERIZATION  10/10/2002   dr Lyndel Safe   mild non-obstructive cad,  ef 25% with anterior akinesis, apical hypokinesis and inferior dyskinesis  . CATARACT EXTRACTION W/ INTRAOCULAR LENS  IMPLANT, BILATERAL  07/2009  . CESAREAN SECTION  x3   last one 10/ 1988    W/ BILATERAL TUBAL LIGATION WITH LAST C/S  . CHOLECYSTECTOMY OPEN  1979  . DILATATION & CURETTAGE/HYSTEROSCOPY WITH MYOSURE N/A 10/05/2018   Procedure: DILATATION & CURETTAGE/HYSTEROSCOPY WITH MYOSURE;  Surgeon: Anastasio Auerbach, MD;  Location: [facility];  Service: Gynecology;  Laterality: N/A;  . KNEE ARTHROSCOPY WITH MEDIAL MENISECTOMY Left 02/25/2014   Procedure: LEFT KNEE ARTHROSCOPY WITH MEDIAL AND LATERAL MENISECTOMY, MICROFRACTURE, SYNOVECTOMY SUPRA PATELLA;  Surgeon: Tobi Bastos, MD;  Location: [facility];  Service: Orthopedics;  Laterality: Left;  . TOE SURGERY  1990   BILATERAL GREAT TOE'S    Family History  Problem Relation Age of Onset  . Hypertension Mother   . COPD Mother   . Thyroid disease Mother   . Heart attack Father   . Heart disease Father   . Thyroid disease Sister   . Arthritis Sister        Rheumatoid  . Hypertension Sister   . Graves' disease Sister   . Colon cancer Maternal Grandfather     Social History   Tobacco Use  . Smoking status: Never Smoker  . Smokeless tobacco: Never Used  Vaping Use  . Vaping Use: Never used  Substance Use Topics  . Alcohol use: Yes    Alcohol/week: 0.0 standard drinks    Comment: rare; twice yearly  . Drug use: Never    ROS Constitutional: Denies fever, chills, weight loss/gain, headaches, insomnia,  night sweats, and change in appetite. Does c/o fatigue. Eyes: Denies redness, blurred vision, diplopia, discharge, itchy, watery eyes.  ENT: Denies discharge, congestion, post nasal drip, epistaxis,  sore throat, earache, hearing loss, dental pain, Tinnitus, Vertigo, Sinus pain, snoring.  Cardio: Denies chest pain, palpitations, irregular heartbeat, syncope, dyspnea, diaphoresis, orthopnea, PND, claudication, edema Respiratory: denies cough, dyspnea, DOE, pleurisy, hoarseness, laryngitis, wheezing.  Gastrointestinal: Denies dysphagia, heartburn, reflux, water brash, pain, cramps, nausea, vomiting, bloating, diarrhea, constipation, hematemesis, melena, hematochezia, jaundice, hemorrhoids Genitourinary: Denies dysuria, frequency, urgency, nocturia, hesitancy, discharge, hematuria, flank pain Breast: Breast lumps, nipple discharge, bleeding.  Musculoskeletal: Denies arthralgia, myalgia, stiffness, Jt. Swelling, pain, limp, and strain/sprain. Denies falls. Skin: Denies puritis, rash, hives, warts, acne, eczema, changing in skin lesion Neuro: No weakness, tremor, incoordination, spasms, paresthesia, pain Psychiatric: Denies confusion, memory loss, sensory loss. Denies Depression. Endocrine: Denies change in weight, skin, hair change, nocturia, and paresthesia, diabetic polys,  visual blurring, hyper / hypo glycemic episodes.  Heme/Lymph: No excessive bleeding, bruising, enlarged lymph nodes.  Physical Exam  BP 120/76   Pulse 82   Temp (!) 96.8 F (36 C)   Resp 16   Ht 5\' 2"  (1.575 m)   Wt 223 lb 9.6 oz (101.4 kg)   SpO2 96%   BMI 40.90 kg/m   General Appearance: Over nourished, well groomed and in no apparent distress.  Eyes: PERRLA, EOMs, conjunctiva no swelling or erythema, normal fundi and vessels. Sinuses: No frontal/maxillary tenderness ENT/Mouth: EACs patent / TMs  nl. Nares clear without erythema, swelling, mucoid exudates. Oral hygiene is good. No erythema, swelling, or exudate. Tongue normal, non-obstructing. Tonsils not swollen or erythematous. Hearing normal.  Neck: Supple, thyroid not palpable. No bruits, nodes or JVD. Respiratory: Respiratory effort normal.  BS equal and  clear bilateral without rales, rhonci, wheezing or stridor. Cardio: Heart sounds are normal with regular rate and rhythm and no murmurs, rubs or gallops. Peripheral pulses are normal and equal bilaterally without edema. No aortic or femoral bruits. Chest: symmetric with normal excursions and percussion. Breasts: Symmetric, without lumps, nipple discharge, retractions, or fibrocystic changes.  Abdomen: Flat, soft with bowel sounds active. Nontender, no guarding, rebound, hernias, masses, or organomegaly.  Lymphatics: Non tender without lymphadenopathy.  Genitourinary:  Musculoskeletal: Full ROM all peripheral extremities, joint stability, 5/5 strength, and normal gait. Skin: Warm and dry without rashes, lesions, cyanosis, clubbing or  ecchymosis.  Neuro: Cranial nerves intact, reflexes equal bilaterally. Normal muscle tone, no cerebellar symptoms. Sensation intact.  Pysch: Alert and oriented X 3, normal affect, Insight and Judgment appropriate.   Assessment and Plan  1. Annual Preventative Screening Examination   2. Essential hypertension  - EKG 12-Lead - Urinalysis, Routine w reflex microscopic - Microalbumin / creatinine urine ratio - CBC with Differential/Platelet - COMPLETE METABOLIC PANEL WITH GFR - Magnesium - TSH  3. Hyperlipidemia associated with type 2 diabetes mellitus (HCC)  - EKG 12-Lead - HM DIABETES FOOT EXAM - LOW EXTREMITY NEUR EXAM DOCUM - Lipid panel - TSH  4. Type 2 diabetes mellitus with stage 2 chronic kidney disease, without long-term current use of insulin (HCC)  - EKG 12-Lead - Hemoglobin A1c - Insulin, random  5. Vitamin D deficiency  - VITAMIN D 25 Hydroxy  6. Screening for colorectal cancer  - POC Hemoccult Bld/Stl   7. Class 3 severe obesity due to excess calories with serious  comorbidity and body mass index (BMI) of 40.0 to 44.9 in adult [facility])  Rx : Phentermine & Topiramate  for Weight loss  8. Screening for ischemic heart  disease  - EKG 12-Lead  9. FHx: heart disease  - EKG 12-Lead  10. Medication management  - Urinalysis, Routine w reflex microscopic - Microalbumin / creatinine urine ratio - CBC with Differential/Platelet - COMPLETE METABOLIC PANEL WITH GFR - Magnesium - Lipid panel - TSH - Hemoglobin A1c - Insulin, random - VITAMIN D 25 Hydroxy          Patient was counseled in prudent diet to achieve/maintain BMI less than 25 for weight control, BP monitoring, regular exercise and medications. Discussed med's effects and SE's. Screening labs and tests as requested with regular follow-up as recommended. Over 40 minutes of exam, counseling, chart review and high complex critical decision making was performed.   Kirtland Bouchard, MD

## 2020-11-16 NOTE — Patient Instructions (Signed)

## 2020-11-17 ENCOUNTER — Other Ambulatory Visit: Payer: Self-pay

## 2020-11-17 ENCOUNTER — Ambulatory Visit: Payer: Medicare PPO | Admitting: Internal Medicine

## 2020-11-17 ENCOUNTER — Encounter: Payer: Self-pay | Admitting: Internal Medicine

## 2020-11-17 VITALS — BP 120/76 | HR 82 | Temp 96.8°F | Resp 16 | Ht 62.0 in | Wt 223.6 lb

## 2020-11-17 DIAGNOSIS — N182 Chronic kidney disease, stage 2 (mild): Secondary | ICD-10-CM | POA: Diagnosis not present

## 2020-11-17 DIAGNOSIS — Z1212 Encounter for screening for malignant neoplasm of rectum: Secondary | ICD-10-CM

## 2020-11-17 DIAGNOSIS — I10 Essential (primary) hypertension: Secondary | ICD-10-CM

## 2020-11-17 DIAGNOSIS — E559 Vitamin D deficiency, unspecified: Secondary | ICD-10-CM

## 2020-11-17 DIAGNOSIS — Z136 Encounter for screening for cardiovascular disorders: Secondary | ICD-10-CM | POA: Diagnosis not present

## 2020-11-17 DIAGNOSIS — E785 Hyperlipidemia, unspecified: Secondary | ICD-10-CM | POA: Diagnosis not present

## 2020-11-17 DIAGNOSIS — E1169 Type 2 diabetes mellitus with other specified complication: Secondary | ICD-10-CM

## 2020-11-17 DIAGNOSIS — E66813 Obesity, class 3: Secondary | ICD-10-CM

## 2020-11-17 DIAGNOSIS — E1122 Type 2 diabetes mellitus with diabetic chronic kidney disease: Secondary | ICD-10-CM

## 2020-11-17 DIAGNOSIS — Z8249 Family history of ischemic heart disease and other diseases of the circulatory system: Secondary | ICD-10-CM | POA: Diagnosis not present

## 2020-11-17 DIAGNOSIS — Z0000 Encounter for general adult medical examination without abnormal findings: Secondary | ICD-10-CM | POA: Diagnosis not present

## 2020-11-17 DIAGNOSIS — Z1211 Encounter for screening for malignant neoplasm of colon: Secondary | ICD-10-CM

## 2020-11-17 DIAGNOSIS — Z79899 Other long term (current) drug therapy: Secondary | ICD-10-CM

## 2020-11-17 DIAGNOSIS — Z0001 Encounter for general adult medical examination with abnormal findings: Secondary | ICD-10-CM

## 2020-11-17 MED ORDER — PHENTERMINE HCL 37.5 MG PO TABS: ORAL_TABLET | ORAL | 1 refills | Status: DC

## 2020-11-17 MED ORDER — TOPIRAMATE 50 MG PO TABS: ORAL_TABLET | ORAL | 1 refills | Status: DC

## 2020-11-18 LAB — URINALYSIS, ROUTINE W REFLEX MICROSCOPIC
Bacteria, UA: NONE SEEN /HPF
Bilirubin Urine: NEGATIVE
Glucose, UA: NEGATIVE
Hgb urine dipstick: NEGATIVE
Hyaline Cast: NONE SEEN /LPF
Nitrite: NEGATIVE
Protein, ur: NEGATIVE
Specific Gravity, Urine: 1.022 (ref 1.001–1.03)
pH: <=5 (ref 5.0–8.0)

## 2020-11-18 LAB — COMPLETE METABOLIC PANEL WITH GFR
AG Ratio: 2 (calc) (ref 1.0–2.5)
ALT: 33 U/L — ABNORMAL HIGH (ref 6–29)
AST: 23 U/L (ref 10–35)
Albumin: 4.6 g/dL (ref 3.6–5.1)
Alkaline phosphatase (APISO): 70 U/L (ref 37–153)
BUN: 12 mg/dL (ref 7–25)
CO2: 25 mmol/L (ref 20–32)
Calcium: 10.2 mg/dL (ref 8.6–10.4)
Chloride: 104 mmol/L (ref 98–110)
Creat: 0.81 mg/dL (ref 0.60–0.93)
GFR, Est African American: 85 mL/min/{1.73_m2} (ref >=60)
GFR, Est Non African American: 74 mL/min/{1.73_m2} (ref >=60)
Globulin: 2.3 g/dL (calc) (ref 1.9–3.7)
Glucose, Bld: 149 mg/dL — ABNORMAL HIGH (ref 65–99)
Potassium: 4.6 mmol/L (ref 3.5–5.3)
Sodium: 141 mmol/L (ref 135–146)
Total Bilirubin: 0.9 mg/dL (ref 0.2–1.2)
Total Protein: 6.9 g/dL (ref 6.1–8.1)

## 2020-11-18 LAB — MICROSCOPIC MESSAGE

## 2020-11-18 LAB — HEMOGLOBIN A1C
Hgb A1c MFr Bld: 6.9 % of total Hgb — ABNORMAL HIGH (ref <5.7)
Mean Plasma Glucose: 151 mg/dL
eAG (mmol/L): 8.4 mmol/L

## 2020-11-18 LAB — CBC WITH DIFFERENTIAL/PLATELET
Absolute Monocytes: 480 cells/uL (ref 200–950)
Basophils Absolute: 60 cells/uL (ref 0–200)
Basophils Relative: 0.8 %
Eosinophils Absolute: 173 cells/uL (ref 15–500)
Eosinophils Relative: 2.3 %
HCT: 42.2 % (ref 35.0–45.0)
Hemoglobin: 13.8 g/dL (ref 11.7–15.5)
Lymphs Abs: 1853 cells/uL (ref 850–3900)
MCH: 27.9 pg (ref 27.0–33.0)
MCHC: 32.7 g/dL (ref 32.0–36.0)
MCV: 85.3 fL (ref 80.0–100.0)
MPV: 11.8 fL (ref 7.5–12.5)
Monocytes Relative: 6.4 %
Neutro Abs: 4935 cells/uL (ref 1500–7800)
Neutrophils Relative %: 65.8 %
Platelets: 281 10*3/uL (ref 140–400)
RBC: 4.95 10*6/uL (ref 3.80–5.10)
RDW: 13.3 % (ref 11.0–15.0)
Total Lymphocyte: 24.7 %
WBC: 7.5 10*3/uL (ref 3.8–10.8)

## 2020-11-18 LAB — INSULIN, RANDOM: Insulin: 54.2 u[IU]/mL — ABNORMAL HIGH

## 2020-11-18 LAB — LIPID PANEL
Cholesterol: 127 mg/dL (ref <200)
HDL: 46 mg/dL — ABNORMAL LOW (ref >=50)
LDL Cholesterol (Calc): 52 mg/dL (calc)
Non-HDL Cholesterol (Calc): 81 mg/dL (calc) (ref <130)
Total CHOL/HDL Ratio: 2.8 (calc) (ref <5.0)
Triglycerides: 234 mg/dL — ABNORMAL HIGH (ref <150)

## 2020-11-18 LAB — VITAMIN D 25 HYDROXY (VIT D DEFICIENCY, FRACTURES): Vit D, 25-Hydroxy: 93 ng/mL (ref 30–100)

## 2020-11-18 LAB — TSH: TSH: 4.4 mIU/L (ref 0.40–4.50)

## 2020-11-18 LAB — MICROALBUMIN / CREATININE URINE RATIO
Creatinine, Urine: 119 mg/dL (ref 20–275)
Microalb Creat Ratio: 30 mcg/mg creat — ABNORMAL HIGH (ref <30)
Microalb, Ur: 3.6 mg/dL

## 2020-11-18 LAB — MAGNESIUM: Magnesium: 2 mg/dL (ref 1.5–2.5)

## 2020-11-18 NOTE — Progress Notes (Signed)
============================================================ -   Test results slightly outside the reference range are not unusual. If there is anything important, I will review this with you,  otherwise it is considered normal test values.  If you have further questions,  please do not hesitate to contact me at the office or via My Chart.  ============================================================ ============================================================  -  Total Chol = 127 andf LDL Chol = 52 - Both  Excellent   - Very low risk for Heart Attack  / Stroke ========================================================  - Triglycerides (   234   ) or fats in blood are too high  (goal is less than 150)    - Recommend avoid fried & greasy foods,  sweets / candy,   - Avoid white rice  (brown or wild rice or Quinoa is OK),   - Avoid white potatoes  (sweet potatoes are OK)   - Avoid anything made from white flour  - bagels, doughnuts, rolls, buns, biscuits, white and   wheat breads, pizza crust and traditional  pasta made of white flour & egg white  - (vegetarian pasta or spinach or wheat pasta is OK).    - Multi-grain bread is OK - like multi-grain flat bread or  sandwich thins.   - Avoid alcohol in excess.   - Exercise is also important. ============================================================ ============================================================  -  A1c = 6.9% - still elevated - so need to work harder with diet.  - Avoid Sweets, Candy & White Stuff   - White Rice, White Jaguas, White Flour  - Breads &  Pasta ============================================================ ============================================================  -  Vitamin D = 93 - Excellent ============================================================ ============================================================  -  All Else - CBC - Kidneys - Electrolytes - Liver - Magnesium & Thyroid     - all  Normal / OK ============================================================ ============================================================

## 2020-12-01 ENCOUNTER — Other Ambulatory Visit: Payer: Self-pay

## 2020-12-01 DIAGNOSIS — Z1212 Encounter for screening for malignant neoplasm of rectum: Secondary | ICD-10-CM

## 2020-12-01 DIAGNOSIS — Z1211 Encounter for screening for malignant neoplasm of colon: Secondary | ICD-10-CM

## 2020-12-01 LAB — POC HEMOCCULT BLD/STL (HOME/3-CARD/SCREEN)
Card #2 Fecal Occult Blod, POC: NEGATIVE
Card #3 Fecal Occult Blood, POC: NEGATIVE
Fecal Occult Blood, POC: NEGATIVE

## 2020-12-07 DIAGNOSIS — Z1212 Encounter for screening for malignant neoplasm of rectum: Secondary | ICD-10-CM | POA: Diagnosis not present

## 2020-12-07 DIAGNOSIS — Z1211 Encounter for screening for malignant neoplasm of colon: Secondary | ICD-10-CM | POA: Diagnosis not present

## 2021-02-18 ENCOUNTER — Ambulatory Visit: Payer: Medicare PPO | Admitting: Adult Health Nurse Practitioner

## 2021-02-19 ENCOUNTER — Ambulatory Visit: Payer: Medicare PPO | Admitting: Adult Health

## 2021-02-19 ENCOUNTER — Other Ambulatory Visit: Payer: Self-pay

## 2021-02-19 ENCOUNTER — Encounter: Payer: Self-pay | Admitting: Adult Health

## 2021-02-19 VITALS — BP 106/68 | HR 60 | Temp 97.3°F | Ht 62.0 in | Wt 210.0 lb

## 2021-02-19 DIAGNOSIS — E785 Hyperlipidemia, unspecified: Secondary | ICD-10-CM | POA: Diagnosis not present

## 2021-02-19 DIAGNOSIS — I5181 Takotsubo syndrome: Secondary | ICD-10-CM

## 2021-02-19 DIAGNOSIS — Z79899 Other long term (current) drug therapy: Secondary | ICD-10-CM

## 2021-02-19 DIAGNOSIS — E559 Vitamin D deficiency, unspecified: Secondary | ICD-10-CM

## 2021-02-19 DIAGNOSIS — N182 Chronic kidney disease, stage 2 (mild): Secondary | ICD-10-CM

## 2021-02-19 DIAGNOSIS — K649 Unspecified hemorrhoids: Secondary | ICD-10-CM | POA: Diagnosis not present

## 2021-02-19 DIAGNOSIS — M1712 Unilateral primary osteoarthritis, left knee: Secondary | ICD-10-CM

## 2021-02-19 DIAGNOSIS — R6889 Other general symptoms and signs: Secondary | ICD-10-CM | POA: Diagnosis not present

## 2021-02-19 DIAGNOSIS — M159 Polyosteoarthritis, unspecified: Secondary | ICD-10-CM | POA: Diagnosis not present

## 2021-02-19 DIAGNOSIS — I129 Hypertensive chronic kidney disease with stage 1 through stage 4 chronic kidney disease, or unspecified chronic kidney disease: Secondary | ICD-10-CM

## 2021-02-19 DIAGNOSIS — K219 Gastro-esophageal reflux disease without esophagitis: Secondary | ICD-10-CM | POA: Diagnosis not present

## 2021-02-19 DIAGNOSIS — E1122 Type 2 diabetes mellitus with diabetic chronic kidney disease: Secondary | ICD-10-CM | POA: Diagnosis not present

## 2021-02-19 DIAGNOSIS — E1169 Type 2 diabetes mellitus with other specified complication: Secondary | ICD-10-CM

## 2021-02-19 DIAGNOSIS — Z0001 Encounter for general adult medical examination with abnormal findings: Secondary | ICD-10-CM

## 2021-02-19 DIAGNOSIS — I10 Essential (primary) hypertension: Secondary | ICD-10-CM | POA: Diagnosis not present

## 2021-02-19 DIAGNOSIS — IMO0001 Reserved for inherently not codable concepts without codable children: Secondary | ICD-10-CM

## 2021-02-19 DIAGNOSIS — Z0000 Encounter for general adult medical examination without abnormal findings: Secondary | ICD-10-CM

## 2021-02-19 NOTE — Progress Notes (Signed)
MEDICARE ANNUAL WELLNESS VISIT AND FOLLOW UP  Assessment:   Diagnoses and all orders for this visit:  Encounter for Medicare annual wellness exam 1 year Check with insurance about shingrix  Takotsubo syndrome No symptoms for over 5 years Has been released by cardiology  Stress-induced cardiomyopathy  Has been released by cardiology; monitor   Hemorrhoids, unspecified hemorrhoid type Asymptomatic at this time; use anusol PRN  Essential hypertension Continue medications Monitor blood pressure at home; call if consistently over 130/80 Continue DASH diet.   Reminder to go to the ER if any CP, SOB, nausea, dizziness, severe HA, changes vision/speech, left arm numbness and tingling and jaw pain.  Type 2 diabetes mellitus with stage 2 chronic kidney disease, without long-term current use of insulin [facility]) Education: Reviewed 'ABCs' of diabetes management (respective goals in parentheses):  A1C (<7), blood pressure (<130/80), and cholesterol (LDL <70) Eye Exam yearly and Dental Exam every 6 months- up to date metformin 4 tabs daily  Dietary recommendations Physical Activity recommendations Foot exam up to date   DJD Managed by OTC analgesics Followed by [facility] Dr. Gladstone Lighter, Dr. Veverly Fells Weight loss advised  Vitamin D deficiency Continue supplementation Check vitamin D level q 6 months or as needed  Morbid obesity (BMI 37)  Long discussion about weight loss, diet, and exercise Recommended diet heavy in fruits and veggies and low in animal meats, cheeses, and dairy products, appropriate calorie intake Discussed appropriate weight for height, she would prefer not to set weight goal, working on general good habits with goal to normalize A1C and get off meds Follow up at next visit - A1C  Medication management CBC, CMP/GFR, magnesium   Gastroesophageal reflux disease, esophagitis presence not specified Well managed on current medications Discussed diet, avoiding triggers  and other lifestyle changes   Hyperlipidemia associated with type 2 diabetes mellitus ([place]) -     Lipid panel -     TSH check lipids, LDL goal <70 decrease fatty foods increase activity.   CKD stage 2 due to type 2 diabetes mellitus ([place]) -     COMPLETE METABOLIC PANEL WITH GFR Increase fluids, avoid NSAIDS, monitor sugars, will monitor    Over 40 minutes of exam, counseling, chart review and critical decision making was performed Future Appointments  Date Time Provider [place]  05/31/2021 10:30 AM Liane Comber, NP [facility] None  11/29/2021 10:00 AM [name], MD [facility] None     Plan:   During the course of the visit the patient was educated and counseled about appropriate screening and preventive services including:   Pneumococcal vaccine  Prevnar 13 Influenza vaccine Td vaccine Screening electrocardiogram Bone densitometry screening Colorectal cancer screening Diabetes screening Glaucoma screening Nutrition counseling  Advanced directives: requested   Subjective:  [name] is a 70 y.o. female who presents for Medicare Annual Wellness Visit and 3 month follow up.   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 Has been released by cardiology. We are monitoring   Established with [facility] as needed, takes aleve occasionally for R foot arthritis.   she has a diagnosis of GERD which is currently doing well off of medications.   BMI is Body mass index is 38.41 kg/m., she is still trying to be active. She is newly on phentermine/topamax, down 13 lb since last visit. Some dry mouth but otherwise denies SE. Drinking more  water and stopped diet drinks.  Wt Readings from Last 3 Encounters:  02/19/21 210 lb (95.3 kg)  11/17/20 223 lb 9.6 oz (101.4 kg)  08/13/20 224 lb (101.6 kg)   She does not check BP at home, today their BP is BP:  106/68 She does not workout. She denies chest pain, shortness of breath, dizziness.   She is on cholesterol medication (rosuvastatin 20 mg daily) and denies myalgias. Her cholesterol is at goal less than 70. The cholesterol last visit was:   Lab Results  Component Value Date   CHOL 127 11/17/2020   HDL 46 (L) 11/17/2020   LDLCALC 52 11/17/2020   TRIG 234 (H) 11/17/2020   CHOLHDL 2.8 11/17/2020    She has not been working on diet and exercise for T2DM well controlled by metformin, foot ulcerations, increased appetite, nausea, paresthesia of the feet, polydipsia, polyuria, visual disturbances, vomiting and weight loss. She does have glucometer and suppliesbut hasn't been checking due to recent very well controlled values. Last A1C in the office was:  Lab Results  Component Value Date   HGBA1C 6.9 (H) 11/17/2020   Last GFR: Lab Results  Component Value Date   GFRNONAA 74 11/17/2020   Patient is on Vitamin D supplement and at goal at recent check:    Lab Results  Component Value Date   VD25OH 93 11/17/2020      Medication Review:  Current Outpatient Medications (Endocrine & Metabolic):    metFORMIN (GLUCOPHAGE-XR) 500 MG 24 hr tablet, Take 2 tablets 2 x /day with Meals for Diabetes  Current Outpatient Medications (Cardiovascular):    bisoprolol-hydrochlorothiazide (ZIAC) 10-6.25 MG tablet, TAKE 1 TABLET BY MOUTH DAILY FOR BLOOD PRESSURE   losartan (COZAAR) 50 MG tablet, TAKE 1 TABLET(50 MG) BY MOUTH DAILY   rosuvastatin (CRESTOR) 20 MG tablet, TAKE 1 TABLET BY MOUTH DAILY FOR CHOLESTEROL   Current Outpatient Medications (Analgesics):    aspirin EC 81 MG tablet, Take 81 mg by mouth daily.   Current Outpatient Medications (Other):    Ascorbic Acid (VITAMIN C) 1000 MG tablet, Take 1,000 mg by mouth daily.   Cholecalciferol (VITAMIN D-3) 5000 UNITS TABS, Take 2 tablets by mouth every morning.   Multiple Vitamin (MULTIVITAMIN) tablet, Take 1 tablet by mouth daily.   phentermine  (ADIPEX-P) 37.5 MG tablet, Take 1/2 to 1 tablet every Morning for Dieting & Weight Loss   topiramate (TOPAMAX) 50 MG tablet, Take 1/2 to 1 tablet 2 x /day at Suppertime & Bedtime for Dieting & Weight Loss  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 diabetes mellitus ([place]) 01/02/2019   Acid reflux 12/11/2017   Takotsubo syndrome 07/16/2015   Morbid obesity ([place]))  06/02/2014   Osteoarthritis of left knee 02/25/2014   Hyperlipidemia associated with type 2 diabetes mellitus ([place]) 12/08/2013   Vitamin D deficiency 12/08/2013   Medication management 12/08/2013   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 ([place]) 09/13/2013   Stress-induced cardiomyopathy 11/18/2010   Essential hypertension 10/29/2007   Hemorrhoids 10/29/2007   DJD 10/29/2007    Screening Tests Immunization History  Administered Date(s) Administered   Influenza, High Dose Seasonal PF 04/30/2015, 05/23/2017, 05/16/2020   Influenza-Unspecified 04/15/2013, 05/06/2014, 04/29/2019   PFIZER(Purple Top)SARS-COV-2 Vaccination 10/19/2019, 11/09/2019, 05/16/2020   Pneumococcal Conjugate-13 11/01/2016   Pneumococcal Polysaccharide-23 04/30/2015   Tdap 03/11/2014   Zoster, Live 05/23/2012    Preventative care:  Last colonoscopy: 2016 due 2026 Pap: 06/2018 by Dr. Phineas Real - endometrial polyp - had D&C, had follow up in 09/2018 Mmg: 07/2020 DEXA: 01/2018 T - 0.7  NORMAL  Discussed new shingles vaccine  Tetanus: 2015 Influenza: 05/2020 Pneumonia: 2/2 Shingrix: check with insurance  Covid 19: 2/2, + booster   Names of Other Physician/Practitioners you currently use:  [facility] here for primary care  Dr. Delman Cheadle, 10/2020, no retinopathy abstracted   Dr. Kemper Durie, dentist, last visit 2022, goes q29m  Patient Care Team: [name], MD as PCP - General (Internal  Medicine) Melissa Noon, [place] as Referring Physician (Optometry) Justice Britain, MD as Consulting Physician (Orthopedic Surgery)  SURGICAL HISTORY She  has a past surgical history that includes Cesarean section (x3   last one 10/ 1988); Knee arthroscopy with medial menisectomy (Left, 02/25/2014); Cholecystectomy open (1979); Cataract extraction w/ intraocular lens  implant, bilateral (07/2009); Toe Surgery (1990); Cardiac catheterization (11/25/2004  dr Lia Foyer); Cardiac catheterization (10/10/2002   dr Lyndel Safe); and Dilatation & curettage/hysteroscopy with myosure (N/A, 10/05/2018). FAMILY HISTORY Her family history includes Arthritis in her sister; COPD in her mother; Colon cancer in her maternal grandfather; Berenice Primas' disease in her sister; Heart attack in her father; Heart disease in her father; Hypertension in her mother and sister; Thyroid disease in her mother and sister. SOCIAL HISTORY She  reports that she has never smoked. She has never used smokeless tobacco. She reports current alcohol use. She reports that she does not use drugs.   MEDICARE WELLNESS OBJECTIVES: Physical activity: Current Exercise Habits: Home exercise routine, Type of exercise: walking, Time (Minutes): 20, Frequency (Times/Week): 2, Weekly Exercise (Minutes/Week): 40, Intensity: Mild, Exercise limited by: None identified Cardiac risk factors: Cardiac Risk Factors include: advanced age (>30men, >72 women);dyslipidemia;hypertension;diabetes mellitus;obesity (BMI >30kg/m2) Depression/mood screen:   Depression screen [facility] 2/9 02/19/2021  Decreased Interest 0  Down, Depressed, Hopeless 0  PHQ - 2 Score 0    ADLs:  In your present state of health, do you have any difficulty performing the following activities: 02/19/2021 11/16/2020  Hearing? N N  Vision? N N  Difficulty concentrating or making decisions? N N  Walking or climbing stairs? N N  Dressing or bathing? N N  Doing errands, shopping? N N  Some recent data might be hidden      Cognitive Testing  Alert? Yes  Normal Appearance?Yes  Oriented to person? Yes  Place? Yes   Time? Yes  Recall of three objects?  Yes  Can perform simple calculations? Yes  Displays appropriate judgment?Yes  Can read the correct time from a watch face?Yes  EOL planning: Does Patient Have a Medical Advance Directive?: Yes Type of Advance Directive: Healthcare Power of Attorney, Living will Does patient want to make changes to medical advance directive?: No - Patient declined Copy of [place] in Chart?: No - copy requested  Review of Systems  Constitutional:  Negative for malaise/fatigue and weight loss.  HENT:  Negative for hearing loss and tinnitus.   Eyes:  Negative for blurred vision and double vision.  Respiratory:  Negative for cough, sputum production, shortness of breath and wheezing.   Cardiovascular:  Negative for chest pain, palpitations, orthopnea, claudication, leg swelling and PND.  Gastrointestinal:  Negative for abdominal pain, blood in stool, constipation, diarrhea, heartburn, melena, nausea and vomiting.  Genitourinary: Negative.   Musculoskeletal:  Negative for falls, joint pain and myalgias.  Skin:  Negative for rash.  Neurological:  Negative for dizziness, tingling,  sensory change, weakness and headaches.  Endo/Heme/Allergies:  Negative for polydipsia.  Psychiatric/Behavioral: Negative.  Negative for depression, memory loss, substance abuse and suicidal ideas. The patient is not nervous/anxious and does not have insomnia.   All other systems reviewed and are negative.   Objective:     Today's Vitals   02/19/21 1023  BP: 106/68  Pulse: 60  Temp: (!) 97.3 F (36.3 C)  SpO2: 97%  Weight: 210 lb (95.3 kg)  Height: 5\' 2"  (1.575 m)   Body mass index is 38.41 kg/m.  General appearance: alert, no distress, WD/WN, female HEENT: normocephalic, sclerae anicteric, TMs pearly, nares patent, no discharge or erythema, pharynx normal Oral  cavity: MMM, no lesions Neck: supple, no lymphadenopathy, no thyromegaly, no masses Heart: RRR, normal S1, S2, no murmurs Lungs: CTA bilaterally, no wheezes, rhonchi, or rales Abdomen: +bs, soft, non tender, non distended, no masses, no hepatomegaly, no splenomegaly Musculoskeletal: nontender, no swelling, no obvious deformity Extremities: no edema, no cyanosis, no clubbing Pulses: 2+ symmetric, upper and lower extremities, normal cap refill Neurological: alert, oriented x 3, CN2-12 intact, strength normal upper extremities and lower extremities, sensation normal throughout, DTRs 2+ throughout, no cerebellar signs, gait normal Psychiatric: normal affect, behavior normal, pleasant   Medicare Attestation I have personally reviewed: The patient's medical and social history Their use of alcohol, tobacco or illicit drugs Their current medications and supplements The patient's functional ability including ADLs,fall risks, home safety risks, cognitive, and hearing and visual impairment Diet and physical activities Evidence for depression or mood disorders  The patient's weight, height, BMI, and visual acuity have been recorded in the chart.  I have made referrals, counseling, and provided education to the patient based on review of the above and I have provided the patient with a written personalized care plan for preventive services.     Izora Ribas, NP   02/19/2021

## 2021-02-19 NOTE — Addendum Note (Signed)
Addended by: Liane Comber C on: 02/19/2021 11:07 AM   Modules accepted: Orders

## 2021-02-20 LAB — CBC WITH DIFFERENTIAL/PLATELET
Absolute Monocytes: 429 cells/uL (ref 200–950)
Basophils Absolute: 53 cells/uL (ref 0–200)
Basophils Relative: 0.8 %
Eosinophils Absolute: 198 cells/uL (ref 15–500)
Eosinophils Relative: 3 %
HCT: 41.1 % (ref 35.0–45.0)
Hemoglobin: 13.2 g/dL (ref 11.7–15.5)
Lymphs Abs: 1663 cells/uL (ref 850–3900)
MCH: 27.7 pg (ref 27.0–33.0)
MCHC: 32.1 g/dL (ref 32.0–36.0)
MCV: 86.3 fL (ref 80.0–100.0)
MPV: 11.4 fL (ref 7.5–12.5)
Monocytes Relative: 6.5 %
Neutro Abs: 4257 cells/uL (ref 1500–7800)
Neutrophils Relative %: 64.5 %
Platelets: 267 10*3/uL (ref 140–400)
RBC: 4.76 10*6/uL (ref 3.80–5.10)
RDW: 13.2 % (ref 11.0–15.0)
Total Lymphocyte: 25.2 %
WBC: 6.6 10*3/uL (ref 3.8–10.8)

## 2021-02-20 LAB — HEMOGLOBIN A1C
Hgb A1c MFr Bld: 6 % of total Hgb — ABNORMAL HIGH (ref <5.7)
Mean Plasma Glucose: 126 mg/dL
eAG (mmol/L): 7 mmol/L

## 2021-02-20 LAB — COMPLETE METABOLIC PANEL WITH GFR
AG Ratio: 2 (calc) (ref 1.0–2.5)
ALT: 20 U/L (ref 6–29)
AST: 13 U/L (ref 10–35)
Albumin: 4.7 g/dL (ref 3.6–5.1)
Alkaline phosphatase (APISO): 57 U/L (ref 37–153)
BUN: 18 mg/dL (ref 7–25)
CO2: 27 mmol/L (ref 20–32)
Calcium: 9.9 mg/dL (ref 8.6–10.4)
Chloride: 103 mmol/L (ref 98–110)
Creat: 0.67 mg/dL (ref 0.60–0.93)
GFR, Est African American: 103 mL/min/{1.73_m2} (ref >=60)
GFR, Est Non African American: 89 mL/min/{1.73_m2} (ref >=60)
Globulin: 2.3 g/dL (calc) (ref 1.9–3.7)
Glucose, Bld: 121 mg/dL — ABNORMAL HIGH (ref 65–99)
Potassium: 4 mmol/L (ref 3.5–5.3)
Sodium: 139 mmol/L (ref 135–146)
Total Bilirubin: 0.8 mg/dL (ref 0.2–1.2)
Total Protein: 7 g/dL (ref 6.1–8.1)

## 2021-02-20 LAB — LIPID PANEL
Cholesterol: 129 mg/dL (ref <200)
HDL: 45 mg/dL — ABNORMAL LOW (ref >=50)
LDL Cholesterol (Calc): 55 mg/dL (calc)
Non-HDL Cholesterol (Calc): 84 mg/dL (calc) (ref <130)
Total CHOL/HDL Ratio: 2.9 (calc) (ref <5.0)
Triglycerides: 230 mg/dL — ABNORMAL HIGH (ref <150)

## 2021-02-20 LAB — TSH: TSH: 2.88 mIU/L (ref 0.40–4.50)

## 2021-02-20 LAB — MAGNESIUM: Magnesium: 2 mg/dL (ref 1.5–2.5)

## 2021-03-04 ENCOUNTER — Other Ambulatory Visit: Payer: Self-pay | Admitting: Adult Health

## 2021-03-04 ENCOUNTER — Ambulatory Visit: Payer: Medicare PPO | Admitting: Adult Health

## 2021-03-04 ENCOUNTER — Encounter: Payer: Self-pay | Admitting: Adult Health

## 2021-03-04 ENCOUNTER — Other Ambulatory Visit: Payer: Self-pay

## 2021-03-04 VITALS — BP 108/68 | HR 76 | Temp 97.9°F | Wt 210.4 lb

## 2021-03-04 DIAGNOSIS — D229 Melanocytic nevi, unspecified: Secondary | ICD-10-CM | POA: Diagnosis not present

## 2021-03-04 DIAGNOSIS — I10 Essential (primary) hypertension: Secondary | ICD-10-CM | POA: Diagnosis not present

## 2021-03-04 DIAGNOSIS — D1801 Hemangioma of skin and subcutaneous tissue: Secondary | ICD-10-CM | POA: Diagnosis not present

## 2021-03-04 NOTE — Progress Notes (Signed)
Assessment and Plan:  [name] was seen today for acute visit.  Diagnoses and all orders for this visit:  Essential hypertension Monitor blood pressure at home; call if consistently over 130/80 Continue DASH diet.   Reminder to go to the ER if any CP, SOB, nausea, dizziness, severe HA, changes vision/speech, left arm numbness and tingling and jaw pain.  Atypical nevi Unclear if vascular or melanotic lesion, new; strong patient preference to proceed with biopsy. With verbal consent punch biopsy was completed today without complications per procedure note. After care discussed, follow up if any concerns with complication. If concern with cancer will refer to derm for wider resection.  -     Surgical pathology   Further disposition pending results of labs. Discussed med's effects and SE's.   Over 30 minutes of exam, counseling, chart review, and critical decision making was performed.   Future Appointments  Date Time Provider [place]  05/31/2021 10:30 AM Paige Comber, NP [facility] None  11/29/2021 10:00 AM Paige Pinto, MD [facility] None    ------------------------------------------------------------------------------------------------------------------   HPI BP 108/68   Pulse 76   Temp 97.9 F (36.6 C)   Wt 210 lb 6.4 oz (95.4 kg)   SpO2 99%   BMI 38.48 kg/m  70 y.o.female with T2DM, htn presents for evauation of possible new lesion to scalp noted by her hairdresser this week. She is unsure how long it has been present.    Past Medical History:  Diagnosis Date   Arthritis    hands, feet   Atherosclerosis of coronary artery of native heart 09/2002   per cardiac cath 10-10-2002 mild non-obstructive of LAD   CKD (chronic kidney disease), stage II    Endometrial polyp    Hemorrhoids    History of cardiomyopathy in adulthood per cardiologist, dr Lia Foyer, note in epic , Takotsuku cardiomyopathy (resolved) and release by cardiology 2012;   10-01-2018 per pt  denies any symptoms past several years   per cath 10-10-2002  ef 25% with wall motion abnormalities;  follow-up cath 11-22-2004  resolution wall motion abnormalities and normal LVF;   echo 11-25-2010  ef 55%, mild LAE,  trivial MR, PR, TR   HTN (hypertension)    Hypercholesterolemia    Right sided sciatica 12/12/2017   Type 2 diabetes mellitus ([place])    followed by pcp     No Known Allergies  Current Outpatient Medications on File Prior to Visit  Medication Sig   Ascorbic Acid (VITAMIN C) 1000 MG tablet Take 1,000 mg by mouth daily.   aspirin EC 81 MG tablet Take 81 mg by mouth daily.   bisoprolol-hydrochlorothiazide (ZIAC) 10-6.25 MG tablet TAKE 1 TABLET BY MOUTH DAILY FOR BLOOD PRESSURE   Cholecalciferol (VITAMIN D-3) 5000 UNITS TABS Take 2 tablets by mouth every morning.   losartan (COZAAR) 50 MG tablet TAKE 1 TABLET(50 MG) BY MOUTH DAILY   metFORMIN (GLUCOPHAGE-XR) 500 MG 24 hr tablet Take 2 tablets 2 x /day with Meals for Diabetes   Multiple Vitamin (MULTIVITAMIN) tablet Take 1 tablet by mouth daily.   phentermine (ADIPEX-P) 37.5 MG tablet Take 1/2 to 1 tablet every Morning for Dieting & Weight Loss   rosuvastatin (CRESTOR) 20 MG tablet TAKE 1 TABLET BY MOUTH DAILY FOR CHOLESTEROL   topiramate (TOPAMAX) 50 MG tablet Take 1/2 to 1 tablet 2 x /day at Suppertime & Bedtime for Dieting & Weight Loss   No current facility-administered medications on file prior to visit.    ROS: all negative except  above.   Physical Exam:  BP 108/68   Pulse 76   Temp 97.9 F (36.6 C)   Wt 210 lb 6.4 oz (95.4 kg)   SpO2 99%   BMI 38.48 kg/m   General Appearance: Well nourished, in no apparent distress. Eyes: PERRLA, conjunctiva no swelling or erythema ENT/Mouth: Hearing normal.  Neck: Supple, Respiratory: Respiratory effort normal  Cardio: Appears well perfused Lymphatics: Non tender without lymphadenopathy.  Musculoskeletal: normal gait.  Skin: Warm, dry; scalp with 2 mm purple flat lesion  to mid/left scalp, non-blanching.  Neuro: Normal muscle tone Psych: Awake and oriented X 3, normal affect, Insight and Judgment appropriate.     Procedure note:  After informed verbal consent was obtained, using Betadine and alcohol for cleansing and 1% Lidocaine without epinephrine for anesthetic, with sterile technique a 3 mm punch biopsy was used to obtain a biopsy specimen of the lesion. Hemostasis was obtained by pressure and bipolar cautery, and wound was not sutured. Antibiotic dressing is applied, and wound care instructions provided. Be alert for any signs of cutaneous infection. The specimen is labeled and sent to pathology for evaluation. The procedure was well tolerated without complications. She was monitored on site for 20 min following procedure without signs of recurrent bleeding.    Izora Ribas, NP 4:48 PM [facility]

## 2021-04-18 ENCOUNTER — Other Ambulatory Visit: Payer: Self-pay | Admitting: Internal Medicine

## 2021-04-18 MED ORDER — METFORMIN HCL ER 500 MG PO TB24: ORAL_TABLET | ORAL | 3 refills | Status: DC

## 2021-05-10 ENCOUNTER — Encounter: Payer: Self-pay | Admitting: Nurse Practitioner

## 2021-05-10 ENCOUNTER — Other Ambulatory Visit: Payer: Self-pay

## 2021-05-10 ENCOUNTER — Ambulatory Visit: Payer: Medicare PPO | Admitting: Nurse Practitioner

## 2021-05-10 VITALS — BP 110/64 | HR 86 | Temp 97.3°F | Wt 205.4 lb

## 2021-05-10 DIAGNOSIS — N3001 Acute cystitis with hematuria: Secondary | ICD-10-CM

## 2021-05-10 DIAGNOSIS — R300 Dysuria: Secondary | ICD-10-CM | POA: Diagnosis not present

## 2021-05-10 MED ORDER — PHENAZOPYRIDINE HCL 100 MG PO TABS: 100.0000 mg | ORAL_TABLET | Freq: Three times a day (TID) | ORAL | 0 refills | Status: DC | PRN

## 2021-05-10 MED ORDER — NITROFURANTOIN MONOHYD MACRO 100 MG PO CAPS: 100.0000 mg | ORAL_CAPSULE | Freq: Two times a day (BID) | ORAL | 0 refills | Status: AC

## 2021-05-10 MED ORDER — PHENAZOPYRIDINE HCL 200 MG PO TABS: 200.0000 mg | ORAL_TABLET | Freq: Three times a day (TID) | ORAL | 0 refills | Status: DC | PRN

## 2021-05-10 NOTE — Patient Instructions (Signed)
Urinary Tract Infection, Adult A urinary tract infection (UTI) is an infection of any part of the urinary tract. The urinary tract includes the kidneys, ureters, bladder, and urethra. These organs make, store, and get rid of urine in the body. An upper UTI affects the ureters and kidneys. A lower UTI affects the bladder and urethra. What are the causes? Most urinary tract infections are caused by bacteria in your genital area around your urethra, where urine leaves your body. These bacteria grow and cause inflammation of your urinary tract. What increases the risk? You are more likely to develop this condition if: You have a urinary catheter that stays in place. You are not able to control when you urinate or have a bowel movement (incontinence). You are female and you: Use a spermicide or diaphragm for birth control. Have low estrogen levels. Are pregnant. You have certain genes that increase your risk. You are sexually active. You take antibiotic medicines. You have a condition that causes your flow of urine to slow down, such as: An enlarged prostate, if you are female. Blockage in your urethra. A kidney stone. A nerve condition that affects your bladder control (neurogenic bladder). Not getting enough to drink, or not urinating often. You have certain medical conditions, such as: Diabetes. A weak disease-fighting system (immunesystem). Sickle cell disease. Gout. Spinal cord injury. What are the signs or symptoms? Symptoms of this condition include: Needing to urinate right away (urgency). Frequent urination. This may include small amounts of urine each time you urinate. Pain or burning with urination. Blood in the urine. Urine that smells bad or unusual. Trouble urinating. Cloudy urine. Vaginal discharge, if you are female. Pain in the abdomen or the lower back. You may also have: Vomiting or a decreased appetite. Confusion. Irritability or tiredness. A fever or  chills. Diarrhea. The first symptom in older adults may be confusion. In some cases, they may not have any symptoms until the infection has worsened. How is this diagnosed? This condition is diagnosed based on your medical history and a physical exam. You may also have other tests, including: Urine tests. Blood tests. Tests for STIs (sexually transmitted infections). If you have had more than one UTI, a cystoscopy or imaging studies may be done to determine the cause of the infections. How is this treated? Treatment for this condition includes: Antibiotic medicine. Over-the-counter medicines to treat discomfort. Drinking enough water to stay hydrated. If you have frequent infections or have other conditions such as a kidney stone, you may need to see a health care provider who specializes in the urinary tract (urologist). In rare cases, urinary tract infections can cause sepsis. Sepsis is a life-threatening condition that occurs when the body responds to an infection. Sepsis is treated in the hospital with IV antibiotics, fluids, and other medicines. Follow these instructions at home: Medicines Take over-the-counter and prescription medicines only as told by your health care provider. If you were prescribed an antibiotic medicine, take it as told by your health care provider. Do not stop using the antibiotic even if you start to feel better. General instructions Make sure you: Empty your bladder often and completely. Do not hold urine for long periods of time. Empty your bladder after sex. Wipe from front to back after urinating or having a bowel movement if you are female. Use each tissue only one time when you wipe. Drink enough fluid to keep your urine pale yellow. Keep all follow-up visits. This is important. Contact a health care provider   if: Your symptoms do not get better after 1-2 days. Your symptoms go away and then return. Get help right away if: You have severe pain in your  back or your lower abdomen. You have a fever or chills. You have nausea or vomiting. Summary A urinary tract infection (UTI) is an infection of any part of the urinary tract, which includes the kidneys, ureters, bladder, and urethra. Most urinary tract infections are caused by bacteria in your genital area. Treatment for this condition often includes antibiotic medicines. If you were prescribed an antibiotic medicine, take it as told by your health care provider. Do not stop using the antibiotic even if you start to feel better. Keep all follow-up visits. This is important. This information is not intended to replace advice given to you by your health care provider. Make sure you discuss any questions you have with your health care provider. Document Revised: 03/13/2020 Document Reviewed: 03/13/2020 Elsevier Patient Education  2022 [employer] Inc[employer]  

## 2021-05-10 NOTE — Addendum Note (Signed)
Addended by: Magda Bernheim on: 05/10/2021 03:47 PM   Modules accepted: Orders

## 2021-05-10 NOTE — Progress Notes (Signed)
Assessment and Plan:  [name] was seen today for urinary tract infection.  Diagnoses and all orders for this visit:  Dysuria -     Urinalysis, Routine w reflex microscopic -     Urine Culture -     nitrofurantoin, macrocrystal-monohydrate, (MACROBID) 100 MG capsule; Take 1 capsule (100 mg total) by mouth 2 (two) times daily for 7 days. -     phenazopyridine (PYRIDIUM) 200 MG tablet; Take 1 tablet (200 mg total) by mouth 3 (three) times daily as needed for pain. For 2 days.  Take after meals.  Hematuria due to acute cystitis -     Urinalysis, Routine w reflex microscopic -     Urine Culture -     nitrofurantoin, macrocrystal-monohydrate, (MACROBID) 100 MG capsule; Take 1 capsule (100 mg total) by mouth 2 (two) times daily for 7 days.      Further disposition pending results of labs. Discussed med's effects and SE's.   Over 20 minutes of exam, counseling, chart review, and critical decision making was performed.   Future Appointments  Date Time Provider [place]  06/09/2021  9:30 AM Liane Comber, NP [facility] None  11/29/2021 10:00 AM [name], MD [facility] None    ------------------------------------------------------------------------------------------------------------------   HPI BP 110/64   Pulse 86   Temp (!) 97.3 F (36.3 C)   Wt 205 lb 6.4 oz (93.2 kg)   SpO2 98%   BMI 37.57 kg/m   70 y.o.female presents for blood in urine with urinary frequency and irritation on urination. Symptoms started Saturday night.  Has not taken any medications. Denies back pain, myalgias and fevers.   BMI is Body mass index is 37.57 kg/m., she has been working on diet and exercise. Wt Readings from Last 3 Encounters:  05/10/21 205 lb 6.4 oz (93.2 kg)  03/04/21 210 lb 6.4 oz (95.4 kg)  02/19/21 210 lb (95.3 kg)     Past Medical History:  Diagnosis Date   Arthritis    hands, feet   Atherosclerosis of coronary artery of native heart 09/2002   per cardiac  cath 10-10-2002 mild non-obstructive of LAD   CKD (chronic kidney disease), stage II    Endometrial polyp    Hemorrhoids    History of cardiomyopathy in adulthood per cardiologist, dr Lia Foyer, note in epic , Takotsuku cardiomyopathy (resolved) and release by cardiology 2012;   10-01-2018 per pt denies any symptoms past several years   per cath 10-10-2002  ef 25% with wall motion abnormalities;  follow-up cath 11-22-2004  resolution wall motion abnormalities and normal LVF;   echo 11-25-2010  ef 55%, mild LAE,  trivial MR, PR, TR   HTN (hypertension)    Hypercholesterolemia    Right sided sciatica 12/12/2017   Type 2 diabetes mellitus ([place])    followed by pcp     No Known Allergies  Current Outpatient Medications on File Prior to Visit  Medication Sig   Ascorbic Acid (VITAMIN C) 1000 MG tablet Take 1,000 mg by mouth daily.   aspirin EC 81 MG tablet Take 81 mg by mouth daily.   bisoprolol-hydrochlorothiazide (ZIAC) 10-6.25 MG tablet TAKE 1 TABLET BY MOUTH DAILY FOR BLOOD PRESSURE   Cholecalciferol (VITAMIN D-3) 5000 UNITS TABS Take 2 tablets by mouth every morning.   losartan (COZAAR) 50 MG tablet TAKE 1 TABLET(50 MG) BY MOUTH DAILY   metFORMIN (GLUCOPHAGE-XR) 500 MG 24 hr tablet Take  2 tablets  2 x /day  with Meals for Diabetes   Multiple Vitamin (  MULTIVITAMIN) tablet Take 1 tablet by mouth daily.   phentermine (ADIPEX-P) 37.5 MG tablet Take 1/2 to 1 tablet every Morning for Dieting & Weight Loss   rosuvastatin (CRESTOR) 20 MG tablet TAKE 1 TABLET BY MOUTH DAILY FOR CHOLESTEROL   topiramate (TOPAMAX) 50 MG tablet Take 1/2 to 1 tablet 2 x /day at Suppertime & Bedtime for Dieting & Weight Loss   No current facility-administered medications on file prior to visit.    ROS: all negative except above.   Physical Exam:  BP 110/64   Pulse 86   Temp (!) 97.3 F (36.3 C)   Wt 205 lb 6.4 oz (93.2 kg)   SpO2 98%   BMI 37.57 kg/m   General Appearance: Well nourished, in no apparent  distress. Eyes: PERRLA, EOMs, conjunctiva no swelling or erythema Sinuses: No Frontal/maxillary tenderness ENT/Mouth: Ext aud canals clear, TMs without erythema, bulging. No erythema, swelling, or exudate on post pharynx.  Tonsils not swollen or erythematous. Hearing normal.  Neck: Supple, thyroid normal.  Respiratory: Respiratory effort normal, BS equal bilaterally without rales, rhonchi, wheezing or stridor.  Cardio: RRR with no MRGs. Brisk peripheral pulses without edema.  Abdomen: Soft, + BS.  Non tender, no guarding, rebound, hernias, masses. Lymphatics: Non tender without lymphadenopathy.  Musculoskeletal: Full ROM, 5/5 strength, normal gait. Negative CVA tenderness Skin: Warm, dry without rashes, lesions, ecchymosis.  Neuro: Cranial nerves intact. Normal muscle tone, no cerebellar symptoms. Sensation intact.  Psych: Awake and oriented X 3, normal affect, Insight and Judgment appropriate.     Magda Bernheim, NP 2:09 PM [facility]

## 2021-05-11 LAB — URINALYSIS, ROUTINE W REFLEX MICROSCOPIC
Bacteria, UA: NONE SEEN /HPF
Bilirubin Urine: NEGATIVE
Glucose, UA: NEGATIVE
Hyaline Cast: NONE SEEN /LPF
Ketones, ur: NEGATIVE
Nitrite: NEGATIVE
Protein, ur: NEGATIVE
RBC / HPF: NONE SEEN /HPF (ref 0–2)
Specific Gravity, Urine: 1.004 (ref 1.001–1.035)
Squamous Epithelial / HPF: NONE SEEN /HPF (ref <=5)
pH: 5.5 (ref 5.0–8.0)

## 2021-05-11 LAB — URINE CULTURE
MICRO NUMBER:: 12422778
Result:: NO GROWTH
SPECIMEN QUALITY:: ADEQUATE

## 2021-05-11 LAB — MICROSCOPIC MESSAGE

## 2021-05-27 ENCOUNTER — Encounter: Payer: Self-pay | Admitting: Internal Medicine

## 2021-05-27 ENCOUNTER — Other Ambulatory Visit: Payer: Self-pay

## 2021-05-27 ENCOUNTER — Ambulatory Visit: Payer: Medicare PPO | Admitting: Internal Medicine

## 2021-05-27 VITALS — BP 130/80 | HR 80 | Temp 97.8°F | Resp 17 | Ht 62.0 in | Wt 204.6 lb

## 2021-05-27 DIAGNOSIS — L509 Urticaria, unspecified: Secondary | ICD-10-CM

## 2021-05-27 MED ORDER — DEXAMETHASONE 4 MG PO TABS: ORAL_TABLET | ORAL | 0 refills | Status: DC

## 2021-05-27 NOTE — Progress Notes (Signed)
Future Appointments  Date Time Provider [place]  05/27/2021 11:45 AM [name], MD [facility] None  06/09/2021    -  6 mo OV  9:30 AM Liane Comber, NP [facility] None  11/29/2021     -  CPE 10:00 AM [name], MD [facility] None    History of Present Illness:   Patient is a very nice 70 yo DWF presenting with c/o 1 week hx o generalized migratory hives. Extensive systems review for new exposures is negative.   Medications  Current Outpatient Medications (Endocrine & Metabolic):    dexamethasone (DECADRON) 4 MG tablet, Take 1 tab 3 x day - 5 days, then 2 x day - 5 days, then 1 tab daily - 5 days , then 1 tablet every other day   metFORMIN (GLUCOPHAGE-XR) 500 MG 24 hr tablet, Take  2 tablets  2 x /day  with Meals for Diabetes  Current Outpatient Medications (Cardiovascular):    bisoprolol-hydrochlorothiazide (ZIAC) 10-6.25 MG tablet, TAKE 1 TABLET BY MOUTH DAILY FOR BLOOD PRESSURE   losartan (COZAAR) 50 MG tablet, TAKE 1 TABLET(50 MG) BY MOUTH DAILY   rosuvastatin (CRESTOR) 20 MG tablet, TAKE 1 TABLET BY MOUTH DAILY FOR CHOLESTEROL   Current Outpatient Medications (Analgesics):    aspirin EC 81 MG tablet, Take 81 mg by mouth daily.   Current Outpatient Medications (Other):    Ascorbic Acid (VITAMIN C) 1000 MG tablet, Take 1,000 mg by mouth daily.   Cholecalciferol (VITAMIN D-3) 5000 UNITS TABS, Take 2 tablets by mouth every morning.   Multiple Vitamin (MULTIVITAMIN) tablet, Take 1 tablet by mouth daily.   phentermine (ADIPEX-P) 37.5 MG tablet, Take 1/2 to 1 tablet every Morning for Dieting & Weight Loss   topiramate (TOPAMAX) 50 MG tablet, Take 1/2 to 1 tablet 2 x /day at Suppertime & Bedtime for Dieting & Weight Loss   phenazopyridine (PYRIDIUM) 100 MG tablet, Take 1 tablet (100 mg total) by mouth 3 (three) times daily as needed for pain. 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   Observations/Objective:  BP 130/80   Pulse 80   Temp 97.8 F (36.6 C)   Resp 17   Ht 5\' 2"  (1.575 m)   Wt 204 lb 9.6 oz (92.8 kg)   SpO2 97%   BMI 37.42 kg/m   HEENT - WNL. Neck - supple.  Chest - Clear equal BS. Cor - Nl HS. RRR w/o sig MGR. PP 1(+). No edema. MS- FROM w/o deformities.  Gait Nl. Neuro -  Nl w/o focal abnormalities. Skin - scattered urticaria over the trunk & extremities.   Assessment and Plan:  1. Urticaria  - Dexamethasone 4 MG tablet;  Take 1 tab 3 x day - 5 days, then 2 x day - 5 days, then 1 tab daily - 5 days ,  then 1 tablet every other day   Dispense: 40 tablet; Refill: 0  - Advised take Cetirizine 10 mg qam & g Benadryl 50 mg qhs   Follow Up Instructions:        I discussed the assessment and treatment plan with the patient. The patient was provided  an opportunity to ask questions and all were answered. The patient agreed with the plan and demonstrated an understanding of the instructions.       The patient was advised to call back or seek an in-person evaluation if the symptoms worsen or if the condition fails to improve as anticipated.    Kirtland Bouchard, MD

## 2021-05-31 ENCOUNTER — Ambulatory Visit: Payer: Medicare PPO | Admitting: Adult Health

## 2021-06-02 ENCOUNTER — Emergency Department (HOSPITAL_BASED_OUTPATIENT_CLINIC_OR_DEPARTMENT_OTHER)
Admission: EM | Admit: 2021-06-02 | Discharge: 2021-06-02 | Disposition: A | Discharge status: Home / Self Care | Payer: Medicare PPO | Attending: Emergency Medicine | Admitting: Emergency Medicine

## 2021-06-02 ENCOUNTER — Encounter (HOSPITAL_BASED_OUTPATIENT_CLINIC_OR_DEPARTMENT_OTHER): Payer: Self-pay | Admitting: Emergency Medicine

## 2021-06-02 ENCOUNTER — Other Ambulatory Visit: Payer: Self-pay

## 2021-06-02 DIAGNOSIS — I129 Hypertensive chronic kidney disease with stage 1 through stage 4 chronic kidney disease, or unspecified chronic kidney disease: Secondary | ICD-10-CM | POA: Diagnosis not present

## 2021-06-02 DIAGNOSIS — N182 Chronic kidney disease, stage 2 (mild): Secondary | ICD-10-CM | POA: Insufficient documentation

## 2021-06-02 DIAGNOSIS — R339 Retention of urine, unspecified: Secondary | ICD-10-CM | POA: Diagnosis not present

## 2021-06-02 DIAGNOSIS — E119 Type 2 diabetes mellitus without complications: Secondary | ICD-10-CM | POA: Diagnosis not present

## 2021-06-02 LAB — CBC WITH DIFFERENTIAL/PLATELET
Abs Immature Granulocytes: 0.88 10*3/uL — ABNORMAL HIGH (ref 0.00–0.07)
Basophils Absolute: 0.1 10*3/uL (ref 0.0–0.1)
Basophils Relative: 1 %
Eosinophils Absolute: 0.2 10*3/uL (ref 0.0–0.5)
Eosinophils Relative: 1 %
HCT: 41.7 % (ref 36.0–46.0)
Hemoglobin: 13.5 g/dL (ref 12.0–15.0)
Immature Granulocytes: 5 %
Lymphocytes Relative: 20 %
Lymphs Abs: 3.5 10*3/uL (ref 0.7–4.0)
MCH: 27.6 pg (ref 26.0–34.0)
MCHC: 32.4 g/dL (ref 30.0–36.0)
MCV: 85.1 fL (ref 80.0–100.0)
Monocytes Absolute: 1 10*3/uL (ref 0.1–1.0)
Monocytes Relative: 5 %
Neutro Abs: 12.3 10*3/uL — ABNORMAL HIGH (ref 1.7–7.7)
Neutrophils Relative %: 68 %
Platelets: 416 10*3/uL — ABNORMAL HIGH (ref 150–400)
RBC: 4.9 MIL/uL (ref 3.87–5.11)
RDW: 13.7 % (ref 11.5–15.5)
WBC: 17.9 10*3/uL — ABNORMAL HIGH (ref 4.0–10.5)
nRBC: 0.2 % (ref 0.0–0.2)

## 2021-06-02 LAB — URINALYSIS, ROUTINE W REFLEX MICROSCOPIC
Bilirubin Urine: NEGATIVE
Glucose, UA: NEGATIVE mg/dL
Ketones, ur: NEGATIVE mg/dL
Leukocytes,Ua: NEGATIVE
Nitrite: NEGATIVE
Protein, ur: NEGATIVE mg/dL
Specific Gravity, Urine: 1.012 (ref 1.005–1.030)
pH: 6.5 (ref 5.0–8.0)

## 2021-06-02 LAB — COMPREHENSIVE METABOLIC PANEL
ALT: 18 U/L (ref 0–44)
AST: 11 U/L — ABNORMAL LOW (ref 15–41)
Albumin: 4 g/dL (ref 3.5–5.0)
Alkaline Phosphatase: 103 U/L (ref 38–126)
Anion gap: 12 (ref 5–15)
BUN: 20 mg/dL (ref 8–23)
CO2: 24 mmol/L (ref 22–32)
Calcium: 9.6 mg/dL (ref 8.9–10.3)
Chloride: 102 mmol/L (ref 98–111)
Creatinine, Ser: 0.61 mg/dL (ref 0.44–1.00)
GFR, Estimated: >60 mL/min (ref >60)
Glucose, Bld: 130 mg/dL — ABNORMAL HIGH (ref 70–99)
Potassium: 3.8 mmol/L (ref 3.5–5.1)
Sodium: 138 mmol/L (ref 135–145)
Total Bilirubin: 0.5 mg/dL (ref 0.3–1.2)
Total Protein: 6.5 g/dL (ref 6.5–8.1)

## 2021-06-02 LAB — LIPASE, BLOOD: Lipase: 30 U/L (ref 11–51)

## 2021-06-02 NOTE — ED Notes (Signed)
Leg bag placed on pt and education provided. Pt notified to call [facility] for follow-up appt. next week

## 2021-06-02 NOTE — Discharge Instructions (Signed)
You were seen in the emerge department today with urinary retention.  This may be due to recent medication changes but please monitor your symptoms closely.  If you develop return of abdominal pain or fever you should return to the emergency department for reevaluation and possible imaging.  Your Foley catheter will remain in place until it is removed by the urologist.  Please call the urology office listed to schedule a follow-up appointment in the next 1 to 2 weeks for removal of the Foley catheter in their office.

## 2021-06-02 NOTE — ED Notes (Signed)
Two bladder scans performed and as follows: 669ml and 728ml

## 2021-06-02 NOTE — ED Provider Notes (Signed)
Emergency Department Provider Note   I have reviewed the triage vital signs and the nursing notes.   HISTORY  Chief Complaint Abdominal Pain and Urinary Retention   HPI [name] is a 70 y.o. female with past medical history reviewed below presents to the emergency department with suprapubic abdominal pain and increased urine hesitancy to the point of passing almost no urine today.  Patient describes short spurts of dribbling urination which is painful.  She does not get nearly complete emptying of the bladder.  She has some associated dysuria but denies fevers.  She notes a recent history of urticaria and was started on dexamethasone by her PCP which is her only new medication.  She did have a urinary tract infection treated in September.  No prior history of urinary retention in the past.  No epigastric abdominal pain, chest pain, flank discomfort.   Past Medical History:  Diagnosis Date   Arthritis    hands, feet   Atherosclerosis of coronary artery of native heart 09/2002   per cardiac cath 10-10-2002 mild non-obstructive of LAD   CKD (chronic kidney disease), stage II    Endometrial polyp    Hemorrhoids    History of cardiomyopathy in adulthood per cardiologist, dr Lia Foyer, note in epic , Takotsuku cardiomyopathy (resolved) and release by cardiology 2012;   10-01-2018 per pt denies any symptoms past several years   per cath 10-10-2002  ef 25% with wall motion abnormalities;  follow-up cath 11-22-2004  resolution wall motion abnormalities and normal LVF;   echo 11-25-2010  ef 55%, mild LAE,  trivial MR, PR, TR   HTN (hypertension)    Hypercholesterolemia    Right sided sciatica 12/12/2017   Type 2 diabetes mellitus ([place])    followed by pcp    Patient Active Problem List   Diagnosis Date Noted   CKD stage 2 due to type 2 diabetes mellitus ([place]) 01/02/2019   Acid reflux 12/11/2017   Takotsubo syndrome 07/16/2015   Morbid obesity ([place]))  06/02/2014   Osteoarthritis  of left knee 02/25/2014   Hyperlipidemia associated with type 2 diabetes mellitus ([place]) 12/08/2013   Vitamin D deficiency 12/08/2013   Medication management 12/08/2013   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 09/13/2013   Stress-induced cardiomyopathy 11/18/2010   Essential hypertension 10/29/2007   Hemorrhoids 10/29/2007   DJD 10/29/2007    Past Surgical History:  Procedure Laterality Date   CARDIAC CATHETERIZATION  11/25/2004  dr Lia Foyer   mild non-obstrucitve coronary atherosclerosis involving LAD,  normal LVF and resolution wall abnormalities from previous cath   CARDIAC CATHETERIZATION  10/10/2002   dr Lyndel Safe   mild non-obstructive cad,  ef 25% with anterior akinesis, apical hypokinesis and inferior dyskinesis   CATARACT EXTRACTION W/ INTRAOCULAR LENS  IMPLANT, BILATERAL  07/2009   CESAREAN SECTION  x3   last one 10/ 1988    W/ BILATERAL TUBAL LIGATION WITH LAST C/S   [place] N/A 10/05/2018   Procedure: [place];  Surgeon: Anastasio Auerbach, MD;  Location: [facility];  Service: Gynecology;  Laterality: N/A;   KNEE ARTHROSCOPY WITH MEDIAL MENISECTOMY Left 02/25/2014   Procedure: LEFT KNEE ARTHROSCOPY WITH MEDIAL AND LATERAL MENISECTOMY, MICROFRACTURE, SYNOVECTOMY SUPRA PATELLA;  Surgeon: Tobi Bastos, MD;  Location: [facility];  Service: Orthopedics;  Laterality: Left;   TOE SURGERY  1990   BILATERAL GREAT TOE'S  Allergies Patient has no known allergies.  Family History  Problem Relation Age of Onset   Hypertension Mother    COPD Mother    Thyroid disease Mother    Heart attack Father    Heart disease Father    Thyroid disease Sister    Arthritis Sister        Rheumatoid   Hypertension Sister    Berenice Primas' disease Sister    Colon cancer Maternal Grandfather     Social History Social History   Tobacco Use    Smoking status: Never   Smokeless tobacco: Never  Vaping Use   Vaping Use: Never used  Substance Use Topics   Alcohol use: Yes    Alcohol/week: 0.0 standard drinks    Comment: rare; twice yearly   Drug use: Never    Review of Systems  Constitutional: No fever/chills Eyes: No visual changes. ENT: No sore throat. Cardiovascular: Denies chest pain. Respiratory: Denies shortness of breath. Gastrointestinal: Positive suprapubic abdominal pain.  No nausea, no vomiting.  No diarrhea.  No constipation. Genitourinary: Positive dysuria and difficulty with urination.  Musculoskeletal: Negative for back pain. Skin: Negative for rash. Neurological: Negative for headaches, focal weakness or numbness.  10-point ROS otherwise negative.  ____________________________________________   PHYSICAL EXAM:  VITAL SIGNS: ED Triage Vitals [06/02/21 0309]  Enc Vitals Group     BP (!) 183/89     Pulse Rate 81     Resp 18     Temp 98 F (36.7 C)     Temp Source Oral     SpO2 100 %     Weight 203 lb (92.1 kg)     Height 5\' 2"  (1.575 m)    Constitutional: Alert and oriented. Well appearing and in no acute distress. Eyes: Conjunctivae are normal.  Head: Atraumatic. Nose: No congestion/rhinnorhea. Mouth/Throat: Mucous membranes are moist.  Neck: No stridor.   Cardiovascular: Normal rate, regular rhythm. Good peripheral circulation. Grossly normal heart sounds.   Respiratory: Normal respiratory effort.  No retractions. Lungs CTAB. Gastrointestinal: Soft with suprapubic fullness/tenderness. No distention.  Musculoskeletal: No gross deformities of extremities. Neurologic:  Normal speech and language.  Skin:  Skin is warm, dry and intact. No rash noted.   ____________________________________________   LABS (all labs ordered are listed, but only abnormal results are displayed)  Labs Reviewed  URINALYSIS, ROUTINE W REFLEX MICROSCOPIC - Abnormal; Notable for the following components:       Result Value   Hgb urine dipstick TRACE (*)    All other components within normal limits  COMPREHENSIVE METABOLIC PANEL - Abnormal; Notable for the following components:   Glucose, Bld 130 (*)    AST 11 (*)    All other components within normal limits  CBC WITH DIFFERENTIAL/PLATELET - Abnormal; Notable for the following components:   WBC 17.9 (*)    Platelets 416 (*)    Neutro Abs 12.3 (*)    Abs Immature Granulocytes 0.88 (*)    All other components within normal limits  URINE CULTURE  LIPASE, BLOOD   ____________________________________________  RADIOLOGY  None   ____________________________________________   PROCEDURES  Procedure(s) performed:   Procedures  None  ____________________________________________   INITIAL IMPRESSION / ASSESSMENT AND PLAN / ED COURSE  Pertinent labs & imaging results that were available during my care of the patient were reviewed by me and considered in my medical decision making (see chart for details).   Patient presents emergency department with symptoms consistent with developing urinary retention.  We  will perform urgent bladder scan and Place Foley as needed. No peritoneal findings on abdominal exam.   03:15 AM  Patient with ~700 mL on the bladder scan. Clinically seems consistent with acute urinary retention. Will place foley and reassess.   05:00 AM  Patient continues to feel well and her pain is completely resolved after placement of the Foley catheter.  UA does not show evidence of infection.  She does have a significant leukocytosis on her CBC but no other acute changes in her blood work.  Abdomen is diffusely soft and non-tender.  I do not see clear indication for emergent abdominal imaging in this setting.  No other infection source.  Suspect that her urine retention may have been medication related.  She will follow closely with her PCP.  We will leave the Foley in place and have provided instructions on managing this at home.   Will give contact information for outpatient urology for void trial. Discussed strict ED return precautions.  ____________________________________________  FINAL CLINICAL IMPRESSION(S) / ED DIAGNOSES  Final diagnoses:  Urinary retention    Note:  This document was prepared using Dragon voice recognition software and may include unintentional dictation errors.  Nanda Quinton, MD, [facility]    Theia Dezeeuw, [facility], MD 06/02/21 [phone]

## 2021-06-02 NOTE — ED Triage Notes (Signed)
  Patient comes in with lower abdominal pain and urinary retention that has been going on for a few days.  Patient states she was started on dexamethasone last week for allergic reaction and has had increasing difficulty using the restroom. Patient states she can only urinate a minimal amount and it hurts.  Lower abdominal pressure and pain.  Pain 6/10, lower abdominal pressure.

## 2021-06-03 LAB — URINE CULTURE: Culture: NO GROWTH

## 2021-06-08 NOTE — Progress Notes (Signed)
3 MONTH FOLLOW UP  Assessment:    Essential hypertension Continue medications Monitor blood pressure at home; call if consistently over 130/80 Continue DASH diet.   Reminder to go to the ER if any CP, SOB, nausea, dizziness, severe HA, changes vision/speech, left arm numbness and tingling and jaw pain.  Type 2 diabetes mellitus with stage 2 chronic kidney disease, without long-term current use of insulin [facility]) Education: Reviewed 'ABCs' of diabetes management (respective goals in parentheses):  A1C (<7), blood pressure (<130/80), and cholesterol (LDL <70) Eye Exam yearly and Dental Exam every 6 months- up to date metformin 4 tabs daily  Dietary recommendations Physical Activity recommendations Foot exam up to date   Hyperlipidemia associated with type 2 diabetes mellitus ([place]) -     Lipid panel -     TSH check lipids, LDL goal <70 decrease fatty foods increase activity.   CKD stage 2 due to type 2 diabetes mellitus (HCC) -     COMPLETE METABOLIC PANEL WITH GFR Increase fluids, avoid NSAIDS, monitor sugars, will monitor  Vitamin D deficiency Continue supplementation Check vitamin D level q 6 months or as needed  Morbid obesity ([place])- BMI 36 with htn, hld, T2DM Long discussion about weight loss, diet, and exercise Recommended diet heavy in fruits and veggies and low in animal meats, cheeses, and dairy products, appropriate calorie intake Discussed appropriate weight for height, she would prefer not to set weight goal, working on general good habits with goal to normalize A1C and get off meds Follow up at next visit - A1C  Medication management CBC, CMP/GFR, magnesium   Gastroesophageal reflux disease, esophagitis presence not specified Well managed on current medications Discussed diet, avoiding triggers and other lifestyle changes   Urinary retention/foley cath in place Acute urinary retention suspected secondary to dexamethasone Foley has been in place 1 week;  UA  was negative for infection prior to foley, urine with sediment today, doesn't appear grossly pyuric, denies fever/chills, no port for pulling sterile urine specimen so will defer Some incontinence yesterday despite foley, limiting actively working patient. Will see if urology can see her sooner than next Thursday for void trial.    Over 40 minutes of exam, counseling, chart review and critical decision making was performed Future Appointments  Date Time Provider [place]  11/29/2021 10:00 AM [name], MD [facility] None     Subjective:  [name] is a 70 y.o. female who presents for 3 month follow up.   She was recently seen in ED 06/02/2021 for urinary retention; notes started 4-5 days into course of dexamethasone prescribed for hives. Hives started a few days AFTER completion of course of macrobid for UTI. Denies other new meds/foods/products. She had ~700 ml on bladder scan in ED and foley was placed with relief of discomfort. UA did not show sign of infection at that time. Did have elevated WBC (suspect secondary to steroid taper). She is concerned today as yesterday had very dark brown/red urine, and episode of incontinence despite foley in place. Has been pushing fluids. Today does note improved, golden urine with sediment. Denies fever/chills. Has urgency but constant with foley in place. No flank pain. She has appointment with urology next Thursday but hopeful to get sooner appointment for foley removal/void trial.   she has a diagnosis of GERD recently off of meds; she notes in the last month has had more belching and intermittent sense of dysphagia/food and mucus getting "stuck in throat." Doesn't correlate with PM/AM or food  intake.   BMI is Body mass index is 36.4 kg/m., she is still trying to be active. She is newly on phentermine/topamax, down 4 lb since last visit. Down from 223 lb in 11/2020. Some dry mouth but otherwise denies SE. Drinking more water and  stopped diet drinks.  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  Has been released by cardiology as no concerning sx in over 5 years.   She does not check BP at home, today their BP is BP: 116/74 She does workout. She denies chest pain, shortness of breath, dizziness.   She is on cholesterol medication (rosuvastatin 20 mg daily) and denies myalgias. Her cholesterol is at goal less than 70. The cholesterol last visit was:   Lab Results  Component Value Date   CHOL 129 02/19/2021   HDL 45 (L) 02/19/2021   LDLCALC 55 02/19/2021   TRIG 230 (H) 02/19/2021   CHOLHDL 2.9 02/19/2021    She has not been working on diet and exercise for T2DM well controlled by metformin, foot ulcerations, increased appetite, nausea, paresthesia of the feet, polydipsia, polyuria, visual disturbances, vomiting and weight loss. She does have glucometer and supplies but hasn't been checking due to recent very well controlled values. Last A1C in the office was:  Lab Results  Component Value Date   HGBA1C 6.0 (H) 02/19/2021   Last GFR: Lab Results  Component Value Date   GFRNONAA >60 06/02/2021   Patient is on Vitamin D supplement and at goal at recent check:    Lab Results  Component Value Date   VD25OH 93 11/17/2020      Medication Review:  Current Outpatient Medications (Endocrine & Metabolic):    metFORMIN (GLUCOPHAGE-XR) 500 MG 24 hr tablet, Take  2 tablets  2 x /day  with Meals for Diabetes   dexamethasone (DECADRON) 4 MG tablet, Take 1 tab 3 x day - 5 days, then 2 x day - 5 days, then 1 tab daily - 5 days , then 1 tablet every other day  Current Outpatient Medications (Cardiovascular):    bisoprolol-hydrochlorothiazide (ZIAC) 10-6.25 MG tablet, TAKE 1 TABLET BY MOUTH DAILY  FOR BLOOD PRESSURE   losartan (COZAAR) 50 MG tablet, TAKE 1 TABLET(50 MG) BY MOUTH DAILY   rosuvastatin (CRESTOR) 20 MG tablet, TAKE 1 TABLET BY MOUTH DAILY FOR CHOLESTEROL   Current Outpatient Medications (Analgesics):    aspirin EC 81 MG tablet, Take 81 mg by mouth daily.   Current Outpatient Medications (Other):    Ascorbic Acid (VITAMIN C) 1000 MG tablet, Take 1,000 mg by mouth daily.   Cholecalciferol (VITAMIN D-3) 5000 UNITS TABS, Take 2 tablets by mouth every morning.   Multiple Vitamin (MULTIVITAMIN) tablet, Take 1 tablet by mouth daily.   phentermine (ADIPEX-P) 37.5 MG tablet, Take 1/2 to 1 tablet every Morning for Dieting & Weight Loss   topiramate (TOPAMAX) 50 MG tablet, Take 1/2 to 1 tablet 2 x /day at Suppertime & Bedtime for Dieting & Weight Loss   phenazopyridine (PYRIDIUM) 100 MG tablet, Take 1 tablet (100 mg total) by mouth 3 (three) times daily as needed for pain. 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 FAMILY HISTORY Her family history includes Arthritis in her sister; COPD in her mother; Colon cancer in her maternal grandfather; Berenice Primas' disease in her sister; Heart attack in her father; Heart disease in her father; Hypertension in her mother and sister; Thyroid disease in her mother and sister. SOCIAL HISTORY She  reports that she has never smoked. She has never used smokeless tobacco. She reports current alcohol use. She reports that she does not use drugs.   Review of Systems  Constitutional:  Negative for malaise/fatigue and weight loss.  HENT:  Negative for hearing loss and tinnitus.   Eyes:  Negative for blurred vision and double vision.  Respiratory:  Negative for cough, sputum production, shortness of breath and wheezing.   Cardiovascular:  Negative for chest pain, palpitations, orthopnea, claudication, leg swelling and PND.  Gastrointestinal:  Negative for abdominal pain, blood in stool, constipation, diarrhea, heartburn, melena, nausea and vomiting.  Genitourinary:  Positive for hematuria and urgency. Negative for dysuria, flank pain and frequency.  Musculoskeletal:  Negative for falls, joint pain and myalgias.  Skin:  Negative for rash.  Neurological:  Negative for dizziness, tingling, sensory change, weakness and headaches.  Endo/Heme/Allergies:  Negative for polydipsia.  Psychiatric/Behavioral: Negative.  Negative for depression, memory loss, substance abuse and suicidal ideas. The patient is not nervous/anxious and does not have insomnia.   All other systems reviewed and are negative.   Objective:     Today's Vitals   06/09/21 0927  BP: 116/74  Pulse: 89  Temp: (!) 96.1 F (35.6 C)  SpO2: 97%  Weight: 199 lb (90.3 kg)   Body mass index is 36.4 kg/m.  General appearance: alert, no distress, WD/WN, female HEENT: normocephalic, sclerae anicteric, TMs pearly, nares patent,  no discharge or erythema, pharynx normal Oral cavity: MMM, no lesions Neck: supple, no lymphadenopathy, no thyromegaly, no masses Heart: RRR, normal S1, S2, no murmurs Lungs: CTA bilaterally, no wheezes, rhonchi, or rales Abdomen: +bs, soft, non tender, non distended, no masses, no hepatomegaly, no splenomegaly. No CVA tenderness. Foley bag to right leg; golden urine with sediment, not murky. No clots or gross hematuria.  Musculoskeletal: nontender, no swelling, no obvious deformity Extremities: no edema, no cyanosis, no clubbing Pulses: 2+ symmetric, upper and lower extremities, normal cap refill Neurological: alert, oriented x 3, CN2-12 intact, strength normal upper extremities and lower extremities, sensation normal throughout, DTRs 2+ throughout, no cerebellar signs, gait normal Psychiatric: normal affect, behavior normal, pleasant    Izora Ribas, NP   06/09/2021

## 2021-06-09 ENCOUNTER — Ambulatory Visit: Payer: Medicare PPO | Admitting: Adult Health

## 2021-06-09 ENCOUNTER — Encounter: Payer: Self-pay | Admitting: Adult Health

## 2021-06-09 ENCOUNTER — Other Ambulatory Visit: Payer: Self-pay

## 2021-06-09 VITALS — BP 116/74 | HR 89 | Temp 96.1°F | Wt 199.0 lb

## 2021-06-09 DIAGNOSIS — Z79899 Other long term (current) drug therapy: Secondary | ICD-10-CM

## 2021-06-09 DIAGNOSIS — E785 Hyperlipidemia, unspecified: Secondary | ICD-10-CM | POA: Diagnosis not present

## 2021-06-09 DIAGNOSIS — E559 Vitamin D deficiency, unspecified: Secondary | ICD-10-CM | POA: Diagnosis not present

## 2021-06-09 DIAGNOSIS — E1122 Type 2 diabetes mellitus with diabetic chronic kidney disease: Secondary | ICD-10-CM

## 2021-06-09 DIAGNOSIS — Z978 Presence of other specified devices: Secondary | ICD-10-CM | POA: Diagnosis not present

## 2021-06-09 DIAGNOSIS — N182 Chronic kidney disease, stage 2 (mild): Secondary | ICD-10-CM

## 2021-06-09 DIAGNOSIS — I10 Essential (primary) hypertension: Secondary | ICD-10-CM

## 2021-06-09 DIAGNOSIS — E1169 Type 2 diabetes mellitus with other specified complication: Secondary | ICD-10-CM | POA: Diagnosis not present

## 2021-06-09 DIAGNOSIS — R339 Retention of urine, unspecified: Secondary | ICD-10-CM | POA: Diagnosis not present

## 2021-06-09 MED ORDER — OMEPRAZOLE 40 MG PO CPDR: DELAYED_RELEASE_CAPSULE | ORAL | 0 refills | Status: DC

## 2021-06-10 LAB — LIPID PANEL
Cholesterol: 137 mg/dL (ref <200)
HDL: 47 mg/dL — ABNORMAL LOW (ref >=50)
LDL Cholesterol (Calc): 62 mg/dL (calc)
Non-HDL Cholesterol (Calc): 90 mg/dL (calc) (ref <130)
Total CHOL/HDL Ratio: 2.9 (calc) (ref <5.0)
Triglycerides: 214 mg/dL — ABNORMAL HIGH (ref <150)

## 2021-06-10 LAB — COMPLETE METABOLIC PANEL WITH GFR
AG Ratio: 1.8 (calc) (ref 1.0–2.5)
ALT: 13 U/L (ref 6–29)
AST: 11 U/L (ref 10–35)
Albumin: 4.2 g/dL (ref 3.6–5.1)
Alkaline phosphatase (APISO): 79 U/L (ref 37–153)
BUN: 15 mg/dL (ref 7–25)
CO2: 25 mmol/L (ref 20–32)
Calcium: 10.3 mg/dL (ref 8.6–10.4)
Chloride: 99 mmol/L (ref 98–110)
Creat: 0.7 mg/dL (ref 0.60–1.00)
Globulin: 2.4 g/dL (calc) (ref 1.9–3.7)
Glucose, Bld: 131 mg/dL — ABNORMAL HIGH (ref 65–99)
Potassium: 4.1 mmol/L (ref 3.5–5.3)
Sodium: 136 mmol/L (ref 135–146)
Total Bilirubin: 0.7 mg/dL (ref 0.2–1.2)
Total Protein: 6.6 g/dL (ref 6.1–8.1)
eGFR: 92 mL/min/{1.73_m2} (ref >=60)

## 2021-06-10 LAB — CBC WITH DIFFERENTIAL/PLATELET
Absolute Monocytes: 621 cells/uL (ref 200–950)
Basophils Absolute: 86 cells/uL (ref 0–200)
Basophils Relative: 0.8 %
Eosinophils Absolute: 407 cells/uL (ref 15–500)
Eosinophils Relative: 3.8 %
HCT: 41.4 % (ref 35.0–45.0)
Hemoglobin: 13.6 g/dL (ref 11.7–15.5)
Lymphs Abs: 2226 cells/uL (ref 850–3900)
MCH: 28 pg (ref 27.0–33.0)
MCHC: 32.9 g/dL (ref 32.0–36.0)
MCV: 85.4 fL (ref 80.0–100.0)
MPV: 11.6 fL (ref 7.5–12.5)
Monocytes Relative: 5.8 %
Neutro Abs: 7362 cells/uL (ref 1500–7800)
Neutrophils Relative %: 68.8 %
Platelets: 382 10*3/uL (ref 140–400)
RBC: 4.85 10*6/uL (ref 3.80–5.10)
RDW: 13.1 % (ref 11.0–15.0)
Total Lymphocyte: 20.8 %
WBC: 10.7 10*3/uL (ref 3.8–10.8)

## 2021-06-10 LAB — HEMOGLOBIN A1C
Hgb A1c MFr Bld: 6.2 % of total Hgb — ABNORMAL HIGH (ref <5.7)
Mean Plasma Glucose: 131 mg/dL
eAG (mmol/L): 7.3 mmol/L

## 2021-06-10 LAB — TSH: TSH: 3.39 mIU/L (ref 0.40–4.50)

## 2021-06-10 LAB — MAGNESIUM: Magnesium: 1.9 mg/dL (ref 1.5–2.5)

## 2021-06-10 NOTE — Telephone Encounter (Signed)
Called patient and reviewed provider recommendations. Patient agrees and understands instructions to keep close watch on precautions given.

## 2021-06-17 DIAGNOSIS — R330 Drug induced retention of urine: Secondary | ICD-10-CM | POA: Diagnosis not present

## 2021-06-24 ENCOUNTER — Other Ambulatory Visit: Payer: Self-pay | Admitting: Internal Medicine

## 2021-06-24 MED ORDER — PHENTERMINE HCL 37.5 MG PO TABS: ORAL_TABLET | ORAL | 1 refills | Status: DC

## 2021-07-01 DIAGNOSIS — N139 Obstructive and reflux uropathy, unspecified: Secondary | ICD-10-CM | POA: Diagnosis not present

## 2021-07-01 DIAGNOSIS — R330 Drug induced retention of urine: Secondary | ICD-10-CM | POA: Diagnosis not present

## 2021-07-18 ENCOUNTER — Encounter: Payer: Self-pay | Admitting: Adult Health

## 2021-07-29 ENCOUNTER — Encounter: Payer: Self-pay | Admitting: Internal Medicine

## 2021-08-02 ENCOUNTER — Other Ambulatory Visit: Payer: Self-pay | Admitting: Adult Health

## 2021-08-02 DIAGNOSIS — E782 Mixed hyperlipidemia: Secondary | ICD-10-CM

## 2021-08-11 DIAGNOSIS — Z1231 Encounter for screening mammogram for malignant neoplasm of breast: Secondary | ICD-10-CM | POA: Diagnosis not present

## 2021-08-11 LAB — HM MAMMOGRAPHY

## 2021-08-29 ENCOUNTER — Encounter: Payer: Self-pay | Admitting: Internal Medicine

## 2021-09-06 ENCOUNTER — Encounter: Payer: Self-pay | Admitting: Internal Medicine

## 2021-09-07 ENCOUNTER — Other Ambulatory Visit: Payer: Self-pay | Admitting: Internal Medicine

## 2021-09-27 DIAGNOSIS — R330 Drug induced retention of urine: Secondary | ICD-10-CM | POA: Diagnosis not present

## 2021-09-27 DIAGNOSIS — R8271 Bacteriuria: Secondary | ICD-10-CM | POA: Diagnosis not present

## 2021-10-08 DIAGNOSIS — M25511 Pain in right shoulder: Secondary | ICD-10-CM | POA: Diagnosis not present

## 2021-10-08 DIAGNOSIS — M47812 Spondylosis without myelopathy or radiculopathy, cervical region: Secondary | ICD-10-CM | POA: Diagnosis not present

## 2021-10-31 ENCOUNTER — Other Ambulatory Visit: Payer: Self-pay | Admitting: Adult Health Nurse Practitioner

## 2021-11-09 DIAGNOSIS — Z961 Presence of intraocular lens: Secondary | ICD-10-CM | POA: Diagnosis not present

## 2021-11-09 DIAGNOSIS — E119 Type 2 diabetes mellitus without complications: Secondary | ICD-10-CM | POA: Diagnosis not present

## 2021-11-09 DIAGNOSIS — H524 Presbyopia: Secondary | ICD-10-CM | POA: Diagnosis not present

## 2021-11-09 DIAGNOSIS — H26491 Other secondary cataract, right eye: Secondary | ICD-10-CM | POA: Diagnosis not present

## 2021-11-09 LAB — HM DIABETES EYE EXAM

## 2021-11-16 ENCOUNTER — Encounter: Payer: Self-pay | Admitting: Internal Medicine

## 2021-11-16 DIAGNOSIS — M25552 Pain in left hip: Secondary | ICD-10-CM | POA: Diagnosis not present

## 2021-11-28 ENCOUNTER — Encounter: Payer: Self-pay | Admitting: Internal Medicine

## 2021-11-28 NOTE — Patient Instructions (Signed)

## 2021-11-28 NOTE — Progress Notes (Signed)
? ?Annual Screening/Preventative Visit ?& Comprehensive Evaluation &  Examination ? ?Future Appointments  ?Date Time Provider Department  ?11/29/2021 10:00 AM [name], MD [facility]  ?12/05/2022 10:00 AM [name], MD [facility]  ? ? ?    This very nice 71 y.o. DWF presents for a Screening /Preventative Visit & comprehensive evaluation and management of multiple medical co-morbidities.  Patient has been followed for HTN, HLD, T2_NIDDM  and Vitamin D Deficiency. Patient has GERD controlled on her Omeprazole.  ? ? ?    Patient also has several large  sebaceous cysts of her Rt axilla  that she keeps cutting while shaving & requests surgical referral for excision.  ? ? ?     HTN predates since  63.   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 Then in 2006, she had a normal Heart Cath and later a Negative Myoview in 2009. Patient's BP has been controlled at home and patient denies any cardiac symptoms as chest pain, palpitations, shortness of breath, dizziness or ankle swelling. Today's BP is at goal - 120/70.  ? ? ?    Patient's hyperlipidemia is controlled with diet and medications. Patient denies myalgias or other medication SE's. Last lipids were at goal except ele\vated Trig's : ? ?Lab Results  ?Component Value Date  ? CHOL 137 06/09/2021  ? HDL 47 (L) 06/09/2021  ? [place] 62 06/09/2021  ? TRIG 214 (H) 06/09/2021  ? CHOLHDL 2.9 06/09/2021  ? ? ? ?    Patient has hx/o Morbid Obesity (BMI 36 +) and hx/o T2_NIDDM   (2007) w/CKD2  and patient denies reactive hypoglycemic symptoms, visual blurring, diabetic polys or paresthesias. Last A1c was not at goal : ? ?Lab Results  ?Component Value Date  ? HGBA1C 6.2 (H) 06/09/2021  ? ? ? ?    Finally, patient has history of Vitamin D Deficiency ("20" /2008) and last Vitamin D was at goal : ? ?Lab Results  ?Component Value Date  ? VD25OH 93 11/17/2020  ? ? ? ?Current Outpatient Medications on File Prior to Visit   ?Medication Sig  ? VITAMIN C 1000 MG tablet Take  daily.  ? aspirin EC 81 MG tablet Take  daily.  ? bisoprolol-hctz 10-6.25 MG tablet TAKE 1 TABLET DAILY   ? VITAMIN D 5000 UNITS TABS Take 2 tablets by mouth every morning.  ? losartan (COZAAR) 50 MG tablet Take  1 tablet Daily  ? metFORMIN-XR 500 MG  Take  2 tablets  2 x /day  with Meals   ? Multiple Vitamin (MULTIVITAMIN) tablet Take 1 tablet daily.  ? omeprazole (PRILOSEC) 40 MG capsule Take 1 tab 1 hour prior to bedtime   ? phentermine 37.5 MG tablet Take 1/2 to 1 tablet every Morning   ? rosuvastatin (CRESTOR) 20 MG tablet TAKE 1 TABLET DAILY   ? topiramate (TOPAMAX) 50 MG tablet Take  1 tablet  2 x /day   ? ? ?No Known Allergies ? ? ?Past Medical History:  ?Diagnosis Date  ? Arthritis   ? hands, feet  ? Atherosclerosis of coronary artery of native heart 09/2002  ? per cardiac cath 10-10-2002 mild non-obstructive of LAD  ? CKD (chronic kidney disease), stage II   ? Endometrial polyp   ? Hemorrhoids   ? 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) and release by cardiology 2012;   10-01-2018 per pt denies any symptoms past several years  ? per  cath 10-10-2002  ef 25% with wall motion abnormalities;  follow-up cath 11-22-2004  resolution wall motion abnormalities and normal LVF;   echo 11-25-2010  ef 55%, mild LAE,  trivial MR, PR, TR  ? HTN (hypertension)   ? Hypercholesterolemia   ? Right sided sciatica 12/12/2017  ? Type 2 diabetes mellitus ([place])   ? followed by pcp  ? ? ? ?Health Maintenance  ?Topic Date Due  ? DEXA SCAN  02/04/2020  ? COVID-19 Vaccine (4 - Booster for [place] series) 07/11/2020  ? Zoster Vaccines- Shingrix (2 of 2) 08/23/2021  ? FOOT EXAM  11/16/2021  ? HEMOGLOBIN A1C  12/08/2021  ? INFLUENZA VACCINE  03/15/2022  ? MAMMOGRAM  08/11/2022  ? OPHTHALMOLOGY EXAM  11/10/2022  ? TETANUS/TDAP  03/11/2024  ? Pneumonia Vaccine 36+ Years old  Completed  ? Hepatitis C Screening  Completed  ? HPV VACCINES  Aged Out   ? ? ? ?Immunization History  ?Administered Date(s) Administered  ? Influenza, High Dose Seasonal PF 04/30/2015, 05/23/2017, 05/16/2020  ? Influenza 04/29/2019, 04/25/2021  ? PFIZER SARS-COV-2 Vacc 10/19/2019, 11/09/2019, 05/16/2020  ? Pneumococcal -13 11/01/2016  ? Pneumococcal -23 04/30/2015  ? Tdap 03/11/2014  ? Zoster Recombinat (Shingrix) 06/28/2021  ? Zoster, Live 05/23/2012  ? ? ?Last Colon - 01/23/2015 - Dr Deatra Ina - Recc 10 yr  F/u  Colon due June 2026 ? ? ?Last MGM -  08/11/2021 ? ? ?Past Surgical History:  ?Procedure Laterality Date  ? CARDIAC CATHETERIZATION  11/25/2004  dr Lia Foyer  ? mild non-obstrucitve coronary atherosclerosis involving LAD,  normal LVF and resolution wall abnormalities from previous cath  ? CARDIAC CATHETERIZATION  10/10/2002   dr Lyndel Safe  ? mild non-obstructive cad,  ef 25% with anterior akinesis, apical hypokinesis and inferior dyskinesis  ? CATARACT EXTRACTION W/ INTRAOCULAR LENS  IMPLANT, BILATERAL  07/2009  ? CESAREAN SECTION  x3   last one 10/ 1988  ?  W/ BILATERAL TUBAL LIGATION WITH LAST C/S  ? [place]  ? DILATATION & CURETTAGE/HYSTEROSCOPY WITH MYOSURE N/A 10/05/2018  ? Procedure: [place];  Surgeon: Anastasio Auerbach, MD;  Location: [facility];  Service: Gynecology;  Laterality: N/A;  ? KNEE ARTHROSCOPY WITH MEDIAL MENISECTOMY Left 02/25/2014  ? Procedure: LEFT KNEE ARTHROSCOPY WITH MEDIAL AND LATERAL MENISECTOMY, MICROFRACTURE, SYNOVECTOMY SUPRA PATELLA;  Surgeon: Tobi Bastos, MD;  Location: [facility];  Service: Orthopedics;  Laterality: Left;  ? TOE SURGERY  1990  ? BILATERAL GREAT TOE'S  ? ? ? ?Family History  ?Problem Relation Age of Onset  ? Hypertension Mother   ? COPD Mother   ? Thyroid disease Mother   ? Heart attack Father   ? Heart disease Father   ? Thyroid disease Sister   ? Arthritis Sister   ?     Rheumatoid  ? Hypertension Sister   ? Graves' disease Sister   ? Colon cancer Maternal  Grandfather   ? ? ? ?Social History  ? ?Tobacco Use  ? Smoking status: Never  ? Smokeless tobacco: Never  ?Vaping Use  ? Vaping Use: Never used  ?Substance Use Topics  ? Alcohol use: Yes  ?  Alcohol/week: 0.0 standard drinks  ?  Comment: rare; twice yearly  ? Drug use: Never  ? ? ? ? ROS ?Constitutional: Denies fever, chills, weight loss/gain, headaches, insomnia,  night sweats, and change in appetite. Does c/o fatigue. ?Eyes: Denies redness, blurred vision, diplopia, discharge, itchy, watery eyes.  ?  ENT: Denies discharge, congestion, post nasal drip, epistaxis, sore throat, earache, hearing loss, dental pain, Tinnitus, Vertigo, Sinus pain, snoring.  ?Cardio: Denies chest pain, palpitations, irregular heartbeat, syncope, dyspnea, diaphoresis, orthopnea, PND, claudication, edema ?Respiratory: denies cough, dyspnea, DOE, pleurisy, hoarseness, laryngitis, wheezing.  ?Gastrointestinal: Denies dysphagia, heartburn, reflux, water brash, pain, cramps, nausea, vomiting, bloating, diarrhea, constipation, hematemesis, melena, hematochezia, jaundice, hemorrhoids ?Genitourinary: Denies dysuria, frequency, urgency, nocturia, hesitancy, discharge, hematuria, flank pain ?Breast: Breast lumps, nipple discharge, bleeding.  ?Musculoskeletal: Denies arthralgia, myalgia, stiffness, Jt. Swelling, pain, limp, and strain/sprain. Denies falls. ?Skin: Denies puritis, rash, hives, warts, acne, eczema, changing in skin lesion ?Neuro: No weakness, tremor, incoordination, spasms, paresthesia, pain ?Psychiatric: Denies confusion, memory loss, sensory loss. Denies Depression. ?Endocrine: Denies change in weight, skin, hair change, nocturia, and paresthesia, diabetic polys, visual blurring, hyper / hypo glycemic episodes.  ?Heme/Lymph: No excessive bleeding, bruising, enlarged lymph nodes. ? ?Physical Exam ? ?BP 120/70   Pulse 83   Temp 97.9 ?F (36.6 ?C)   Resp 16   Ht '5\' 2"'$  (1.575 m)   Wt 199 lb 12.8 oz (90.6 kg)   SpO2 96%   BMI 36.54  kg/m?  ? ?General Appearance: Over nourished and in no apparent distress. ? ?Eyes: PERRLA, EOMs, conjunctiva no swelling or erythema, normal fundi and vessels. ?Sinuses: No frontal/maxillary tenderness ?ENT/Mouth: EACs pat

## 2021-11-29 ENCOUNTER — Encounter: Payer: Self-pay | Admitting: Internal Medicine

## 2021-11-29 ENCOUNTER — Ambulatory Visit: Payer: Medicare PPO | Admitting: Internal Medicine

## 2021-11-29 VITALS — BP 120/70 | HR 83 | Temp 97.9°F | Resp 16 | Ht 62.0 in | Wt 199.8 lb

## 2021-11-29 DIAGNOSIS — E559 Vitamin D deficiency, unspecified: Secondary | ICD-10-CM | POA: Diagnosis not present

## 2021-11-29 DIAGNOSIS — I10 Essential (primary) hypertension: Secondary | ICD-10-CM | POA: Diagnosis not present

## 2021-11-29 DIAGNOSIS — Z79899 Other long term (current) drug therapy: Secondary | ICD-10-CM | POA: Diagnosis not present

## 2021-11-29 DIAGNOSIS — Z13820 Encounter for screening for osteoporosis: Secondary | ICD-10-CM

## 2021-11-29 DIAGNOSIS — Z0000 Encounter for general adult medical examination without abnormal findings: Secondary | ICD-10-CM | POA: Diagnosis not present

## 2021-11-29 DIAGNOSIS — L723 Sebaceous cyst: Secondary | ICD-10-CM

## 2021-11-29 DIAGNOSIS — E1169 Type 2 diabetes mellitus with other specified complication: Secondary | ICD-10-CM | POA: Diagnosis not present

## 2021-11-29 DIAGNOSIS — Z136 Encounter for screening for cardiovascular disorders: Secondary | ICD-10-CM | POA: Diagnosis not present

## 2021-11-29 DIAGNOSIS — Z8249 Family history of ischemic heart disease and other diseases of the circulatory system: Secondary | ICD-10-CM | POA: Diagnosis not present

## 2021-11-29 DIAGNOSIS — E1122 Type 2 diabetes mellitus with diabetic chronic kidney disease: Secondary | ICD-10-CM | POA: Diagnosis not present

## 2021-11-29 DIAGNOSIS — K219 Gastro-esophageal reflux disease without esophagitis: Secondary | ICD-10-CM

## 2021-11-29 DIAGNOSIS — E785 Hyperlipidemia, unspecified: Secondary | ICD-10-CM | POA: Diagnosis not present

## 2021-11-29 DIAGNOSIS — N182 Chronic kidney disease, stage 2 (mild): Secondary | ICD-10-CM | POA: Diagnosis not present

## 2021-11-29 DIAGNOSIS — Z0001 Encounter for general adult medical examination with abnormal findings: Secondary | ICD-10-CM

## 2021-11-29 DIAGNOSIS — Z1211 Encounter for screening for malignant neoplasm of colon: Secondary | ICD-10-CM

## 2021-11-29 DIAGNOSIS — Z1212 Encounter for screening for malignant neoplasm of rectum: Secondary | ICD-10-CM | POA: Diagnosis not present

## 2021-11-29 DIAGNOSIS — E2839 Other primary ovarian failure: Secondary | ICD-10-CM

## 2021-11-30 LAB — COMPLETE METABOLIC PANEL WITH GFR
AG Ratio: 2 (calc) (ref 1.0–2.5)
ALT: 21 U/L (ref 6–29)
AST: 16 U/L (ref 10–35)
Albumin: 4.5 g/dL (ref 3.6–5.1)
Alkaline phosphatase (APISO): 76 U/L (ref 37–153)
BUN: 17 mg/dL (ref 7–25)
CO2: 27 mmol/L (ref 20–32)
Calcium: 10 mg/dL (ref 8.6–10.4)
Chloride: 103 mmol/L (ref 98–110)
Creat: 0.91 mg/dL (ref 0.60–1.00)
Globulin: 2.3 g/dL (calc) (ref 1.9–3.7)
Glucose, Bld: 103 mg/dL — ABNORMAL HIGH (ref 65–99)
Potassium: 3.9 mmol/L (ref 3.5–5.3)
Sodium: 139 mmol/L (ref 135–146)
Total Bilirubin: 0.8 mg/dL (ref 0.2–1.2)
Total Protein: 6.8 g/dL (ref 6.1–8.1)
eGFR: 67 mL/min/{1.73_m2} (ref >=60)

## 2021-11-30 LAB — LIPID PANEL
Cholesterol: 121 mg/dL (ref <200)
HDL: 50 mg/dL (ref >=50)
LDL Cholesterol (Calc): 46 mg/dL (calc)
Non-HDL Cholesterol (Calc): 71 mg/dL (calc) (ref <130)
Total CHOL/HDL Ratio: 2.4 (calc) (ref <5.0)
Triglycerides: 177 mg/dL — ABNORMAL HIGH (ref <150)

## 2021-11-30 LAB — URINALYSIS, ROUTINE W REFLEX MICROSCOPIC
Bacteria, UA: NONE SEEN /HPF
Bilirubin Urine: NEGATIVE
Glucose, UA: NEGATIVE
Hgb urine dipstick: NEGATIVE
Hyaline Cast: NONE SEEN /LPF
Ketones, ur: NEGATIVE
Nitrite: NEGATIVE
Protein, ur: NEGATIVE
RBC / HPF: NONE SEEN /HPF (ref 0–2)
Specific Gravity, Urine: 1.007 (ref 1.001–1.035)
pH: 5.5 (ref 5.0–8.0)

## 2021-11-30 LAB — CBC WITH DIFFERENTIAL/PLATELET
Absolute Monocytes: 458 cells/uL (ref 200–950)
Basophils Absolute: 71 cells/uL (ref 0–200)
Basophils Relative: 0.9 %
Eosinophils Absolute: 253 cells/uL (ref 15–500)
Eosinophils Relative: 3.2 %
HCT: 40.7 % (ref 35.0–45.0)
Hemoglobin: 13.4 g/dL (ref 11.7–15.5)
Lymphs Abs: 2291 cells/uL (ref 850–3900)
MCH: 28.3 pg (ref 27.0–33.0)
MCHC: 32.9 g/dL (ref 32.0–36.0)
MCV: 85.9 fL (ref 80.0–100.0)
MPV: 11.6 fL (ref 7.5–12.5)
Monocytes Relative: 5.8 %
Neutro Abs: 4827 cells/uL (ref 1500–7800)
Neutrophils Relative %: 61.1 %
Platelets: 263 10*3/uL (ref 140–400)
RBC: 4.74 10*6/uL (ref 3.80–5.10)
RDW: 13.6 % (ref 11.0–15.0)
Total Lymphocyte: 29 %
WBC: 7.9 10*3/uL (ref 3.8–10.8)

## 2021-11-30 LAB — HEMOGLOBIN A1C
Hgb A1c MFr Bld: 6.1 % of total Hgb — ABNORMAL HIGH (ref <5.7)
Mean Plasma Glucose: 128 mg/dL
eAG (mmol/L): 7.1 mmol/L

## 2021-11-30 LAB — TSH: TSH: 3.59 mIU/L (ref 0.40–4.50)

## 2021-11-30 LAB — MICROALBUMIN / CREATININE URINE RATIO
Creatinine, Urine: 29 mg/dL (ref 20–275)
Microalb Creat Ratio: 14 mcg/mg creat (ref <30)
Microalb, Ur: 0.4 mg/dL

## 2021-11-30 LAB — MICROSCOPIC MESSAGE

## 2021-11-30 LAB — INSULIN, RANDOM: Insulin: 13.6 u[IU]/mL

## 2021-11-30 LAB — VITAMIN D 25 HYDROXY (VIT D DEFICIENCY, FRACTURES): Vit D, 25-Hydroxy: 112 ng/mL — ABNORMAL HIGH (ref 30–100)

## 2021-11-30 LAB — MAGNESIUM: Magnesium: 1.9 mg/dL (ref 1.5–2.5)

## 2021-11-30 NOTE — Progress Notes (Signed)
<><><><><><><><><><><><><><><><><><><><><><><><><><><><><><><><><> ?<><><><><><><><><><><><><><><><><><><><><><><><><><><><><><><><><> ?-   Test results slightly outside the reference range are not unusual. ?If there is anything important, I will review this with you,  ?otherwise it is considered normal test values.  ?If you have further questions,  ?please do not hesitate to contact me at the office or via My Chart.  ?<><><><><><><><><><><><><><><><><><><><><><><><><><><><><><><><><> ?<><><><><><><><><><><><><><><><><><><><><><><><><><><><><><><><><> ? ?-  Total Cholesterol = 121     &   LDL   Chol = 46   - Both    Excellent ! ? ?- Very low risk for Heart Attack  / Stroke ?<><><><><><><><><><><><><><><><><><><><><><><><><><><><><><><><><> ?<><><><><><><><><><><><><><><><><><><><><><><><><><><><><><><><><> ? ?-  A1c = 6.1%  borderline slightly elevated  ?                                                   - Goal is less 6.0%  & IDEAL is less than 5.7%  ?<><><><><><><><><><><><><><><><><><><><><><><><><><><><><><><><><> ?<><><><><><><><><><><><><><><><><><><><><><><><><><><><><><><><><> ? ?-  Vitamin D = 112 , borderline slightly elevated , So  ? ?Recommend decrease your dose  Vit D 5,000 units x 2 capsules  to  ? ?- down to 2 capsules even days of month & Only 1 capsule on odd days of month  ?<><><><><><><><><><><><><><><><><><><><><><><><><><><><><><><><><> ?<><><><><><><><><><><><><><><><><><><><><><><><><><><><><><><><><> ? ?-  All Else - CBC - Kidneys - Electrolytes - Liver - Magnesium & Thyroid   ? ?- all  Normal / OK ?<><><><><><><><><><><><><><><><><><><><><><><><><><><><><><><><><> ?<><><><><><><><><><><><><><><><><><><><><><><><><><><><><><><><><> ? ? ? ? ? ? ? ? ? ? ? ? ? ? ? ? ? ? ? ? ? ? ?

## 2022-01-01 ENCOUNTER — Other Ambulatory Visit: Payer: Self-pay | Admitting: Internal Medicine

## 2022-01-06 ENCOUNTER — Other Ambulatory Visit: Payer: Self-pay

## 2022-01-06 DIAGNOSIS — Z1211 Encounter for screening for malignant neoplasm of colon: Secondary | ICD-10-CM

## 2022-01-06 DIAGNOSIS — Z1212 Encounter for screening for malignant neoplasm of rectum: Secondary | ICD-10-CM | POA: Diagnosis not present

## 2022-01-06 LAB — POC HEMOCCULT BLD/STL (HOME/3-CARD/SCREEN)
Card #2 Fecal Occult Blod, POC: NEGATIVE
Card #3 Fecal Occult Blood, POC: NEGATIVE
Fecal Occult Blood, POC: NEGATIVE

## 2022-01-09 ENCOUNTER — Encounter: Payer: Self-pay | Admitting: Internal Medicine

## 2022-01-09 ENCOUNTER — Other Ambulatory Visit: Payer: Self-pay | Admitting: Internal Medicine

## 2022-01-09 DIAGNOSIS — E2839 Other primary ovarian failure: Secondary | ICD-10-CM

## 2022-01-09 DIAGNOSIS — Z13820 Encounter for screening for osteoporosis: Secondary | ICD-10-CM

## 2022-01-09 DIAGNOSIS — N951 Menopausal and female climacteric states: Secondary | ICD-10-CM

## 2022-01-17 ENCOUNTER — Other Ambulatory Visit: Payer: Self-pay | Admitting: Internal Medicine

## 2022-03-04 DIAGNOSIS — L720 Epidermal cyst: Secondary | ICD-10-CM | POA: Diagnosis not present

## 2022-03-08 ENCOUNTER — Ambulatory Visit: Payer: Medicare PPO | Admitting: Nurse Practitioner

## 2022-03-08 ENCOUNTER — Encounter: Payer: Self-pay | Admitting: Nurse Practitioner

## 2022-03-08 VITALS — BP 116/68 | HR 87 | Temp 97.2°F | Ht 62.0 in | Wt 200.0 lb

## 2022-03-08 DIAGNOSIS — E1122 Type 2 diabetes mellitus with diabetic chronic kidney disease: Secondary | ICD-10-CM

## 2022-03-08 DIAGNOSIS — Z0000 Encounter for general adult medical examination without abnormal findings: Secondary | ICD-10-CM | POA: Diagnosis not present

## 2022-03-08 DIAGNOSIS — M546 Pain in thoracic spine: Secondary | ICD-10-CM

## 2022-03-08 DIAGNOSIS — R7309 Other abnormal glucose: Secondary | ICD-10-CM

## 2022-03-08 DIAGNOSIS — M159 Polyosteoarthritis, unspecified: Secondary | ICD-10-CM

## 2022-03-08 DIAGNOSIS — E559 Vitamin D deficiency, unspecified: Secondary | ICD-10-CM

## 2022-03-08 DIAGNOSIS — R6889 Other general symptoms and signs: Secondary | ICD-10-CM | POA: Diagnosis not present

## 2022-03-08 DIAGNOSIS — I10 Essential (primary) hypertension: Secondary | ICD-10-CM | POA: Diagnosis not present

## 2022-03-08 DIAGNOSIS — I5181 Takotsubo syndrome: Secondary | ICD-10-CM

## 2022-03-08 DIAGNOSIS — Z0001 Encounter for general adult medical examination with abnormal findings: Secondary | ICD-10-CM | POA: Diagnosis not present

## 2022-03-08 DIAGNOSIS — K219 Gastro-esophageal reflux disease without esophagitis: Secondary | ICD-10-CM

## 2022-03-08 DIAGNOSIS — E1169 Type 2 diabetes mellitus with other specified complication: Secondary | ICD-10-CM

## 2022-03-08 DIAGNOSIS — K649 Unspecified hemorrhoids: Secondary | ICD-10-CM

## 2022-03-08 DIAGNOSIS — N6001 Solitary cyst of right breast: Secondary | ICD-10-CM

## 2022-03-08 DIAGNOSIS — Z79899 Other long term (current) drug therapy: Secondary | ICD-10-CM

## 2022-03-08 DIAGNOSIS — Z13820 Encounter for screening for osteoporosis: Secondary | ICD-10-CM

## 2022-03-08 DIAGNOSIS — M62838 Other muscle spasm: Secondary | ICD-10-CM

## 2022-03-08 MED ORDER — PREDNISONE 20 MG PO TABS
ORAL_TABLET | ORAL | 0 refills | Status: DC
Start: 2022-03-08 — End: 2022-04-08

## 2022-03-08 MED ORDER — GABAPENTIN 100 MG PO CAPS: ORAL_CAPSULE | ORAL | 0 refills | Status: DC

## 2022-03-08 NOTE — Patient Instructions (Signed)
Muscle Cramps and Spasms Muscle cramps and spasms occur when a muscle or muscles tighten and you have no control over this tightening (involuntary muscle contraction). They are a common problem and can develop in any muscle. The most common place is in the calf muscles of the leg. Muscle cramps and muscle spasms are both involuntary muscle contractions, but there are some differences between the two: Muscle cramps are painful. They come and go and may last for a few seconds or up to 15 minutes. Muscle cramps are often more forceful and last longer than muscle spasms. Muscle spasms may or may not be painful. They may also last just a few seconds or much longer. Certain medical conditions, such as diabetes or Parkinson's disease, can make it more likely to develop cramps or spasms. However, cramps or spasms are usually not caused by a serious underlying problem. Common causes include: Doing more physical work or exercise than your body is ready for (overexertion). Overuse from repeating certain movements too many times. Remaining in a certain position for a long period of time. Improper preparation, form, or technique while playing a sport or doing an activity. Dehydration. Injury. Side effects of some medicines. Abnormally low levels of the salts and minerals in your blood (electrolytes), especially potassium and calcium. This could happen if you are taking water pills (diuretics) or if you are pregnant. In many cases, the cause of muscle cramps or spasms is not known. Follow these instructions at home: Managing pain and stiffness     Try massaging, stretching, and relaxing the affected muscle. Do this for several minutes at a time. If directed, apply heat to tight or tense muscles as often as told by your health care provider. Use the heat source that your health care provider recommends, such as a moist heat pack or a heating pad. Place a towel between your skin and the heat source. Leave the  heat on for 20-30 minutes. Remove the heat if your skin turns bright red. This is especially important if you are unable to feel pain, heat, or cold. You may have a greater risk of getting burned. If directed, put ice on the affected area. This may help if you are sore or have pain after a cramp or spasm. Put ice in a plastic bag. Place a towel between your skin and the bag. Leave the ice on for 20 minutes, 2-3 times a day. Try taking hot showers or baths to help relax tight muscles. Eating and drinking Drink enough fluid to keep your urine pale yellow. Staying well hydrated may help prevent cramps or spasms. Eat a healthy diet that includes plenty of nutrients to help your muscles function. A healthy diet includes fruits and vegetables, lean protein, whole grains, and low-fat or nonfat dairy products. General instructions If you are having frequent cramps, avoid intense exercise for several days. Take over-the-counter and prescription medicines only as told by your health care provider. Pay attention to any changes in your symptoms. Keep all follow-up visits as told by your health care provider. This is important. Contact a health care provider if: Your cramps or spasms get more severe or happen more often. Your cramps or spasms do not improve over time. Summary Muscle cramps and spasms occur when a muscle or muscles tighten and you have no control over this tightening (involuntary muscle contraction). The most common place for cramps or spasms to occur is in the calf muscles of the leg. Massaging, stretching, and relaxing the affected   muscle may relieve the cramp or spasm. Drink enough fluid to keep your urine pale yellow. Staying well hydrated may help prevent cramps or spasms. This information is not intended to replace advice given to you by your health care provider. Make sure you discuss any questions you have with your health care provider. Document Revised: 02/19/2021 Document  Reviewed: 02/19/2021 Elsevier Patient Education  2023 Elsevier Inc.  

## 2022-03-08 NOTE — Progress Notes (Signed)
MEDICARE ANNUAL WELLNESS VISIT AND FOLLOW UP  Assessment:   Diagnoses and all orders for this visit:  Encounter for Medicare annual wellness exam Due Annually  Takotsubo syndrome Active Surveillance No symptoms for over 5 years Has been released by cardiology  Stress-induced cardiomyopathy  Active Surveillance Has been released by cardiology; monitor   Hemorrhoids, unspecified hemorrhoid type Asymptomatic at this time; use anusol PRN Keep BM soft. Stay well hydrated  Essential hypertension Controlled  Continue medications;  Discussed DASH (Dietary Approaches to Stop Hypertension) DASH diet is lower in sodium than a typical American diet. Cut back on foods that are high in saturated fat, cholesterol, and trans fats. Eat more whole-grain foods, fish, poultry, and nuts Remain active and exercise as tolerated daily.  Monitor BP at home-Call if greater than 130/80.    Type 2 diabetes mellitus with stage 2 chronic kidney disease, without long-term current use of insulin [facility]) Education: Reviewed 'ABCs' of diabetes management  A1C (<7):  Goal Met. Blood pressure (<130/80):  Goal Met Cholesterol (LDL <70):  Goal Met Continue Eye Exam yearly  Continue Dental Exam Q6 mo Discussed dietary recommendations Discussed Physical Activity recommendations Foot exam UTD  Discussed how what you eat and drink can aide in kidney protection. Stay well hydrated. Avoid high salt foods. Avoid NSAIDS. Keep BP and BG well controlled.   Take medications as prescribed. Remain active and exercise as tolerated daily. Maintain weight.  Continue to monitor.   DJD Managed by OTC analgesics Followed by [facility] Dr. Gladstone Lighter, Dr. Veverly Fells Weight loss advised  Vitamin D deficiency Slightly elevated 11/2021. She has reduced dose. Check vitamin D level q 6 months or as needed  Morbid obesity (BMI 37)  Discussed appropriate BMI Goal of losing 1 lb per month. Diet modification. Physical  activity. Encouraged/praised to build confidence.   Medication management All medications discussed and reviewed in full. All questions and concerns regarding medications addressed.     Gastroesophageal reflux disease, esophagitis presence not specified Well managed on current medications No suspected reflux complications (Barret/stricture). Lifestyle modification:  wt loss, avoid meals 2-3h before bedtime. Consider eliminating food triggers:  chocolate, caffeine, EtOH, acid/spicy food.   Hyperlipidemia associated with type 2 diabetes mellitus ([place]) Controlled with slightly elevated Triglycerides. Suggest Omega 3 Supplement Continue medications;  Discussed lifestyle modifications. Recommended diet heavy in fruits and veggies, omega 3's. Decrease consumption of animal meats, cheeses, and dairy products. Remain active and exercise as tolerated. Continue to monitor.   Cyst Right Breast (Epidermal) I/D Surgery to be scheduled with General Surgery, Dr. Karle Barr  Continue to monitor  Acute Right sided back pain Start Prednisone taper Monitor BG Alternate Ice/Heat Rest.  Muscle Spasm Gabapentin re-filled.  Screening for Osteoporosis -DEXA Scan Ordered  Meds ordered this encounter  Medications   gabapentin (NEURONTIN) 100 MG capsule    Sig: Take 1 capsule (100 mg) TID PRN.    Dispense:  30 capsule    Refill:  0   predniSONE (DELTASONE) 20 MG tablet    Sig: Take 2 tabs (40 mg) for 3 days followed by 1 tab (20 mg) for 4 days.    Dispense:  10 tablet    Refill:  0    Order Specific Question:   Supervising Provider    Answer:   [name] [phone]    Orders Placed This Encounter  Procedures   DG Bone Density    Standing Status:   Future    Standing Expiration Date:  03/09/2023    Scheduling Instructions:     Patient requesting to have completed with [facility]    Order Specific Question:   Reason for Exam (SYMPTOM  OR DIAGNOSIS REQUIRED)    Answer:    routine screening    Order Specific Question:   Preferred imaging location?    Answer:   External     Over 40 minutes of exam, counseling, chart review and critical decision making was performed Future Appointments  Date Time Provider [place]  06/23/2022 10:30 AM [name], MD [facility] None  12/05/2022 10:00 AM [name], MD [facility] None  03/08/2023  9:00 AM Darrol Jump, NP [facility] None     Plan:   During the course of the visit the patient was educated and counseled about appropriate screening and preventive services including:   Pneumococcal vaccine  Prevnar 13 Influenza vaccine Td vaccine Screening electrocardiogram Bone densitometry screening Colorectal cancer screening Diabetes screening Glaucoma screening Nutrition counseling  Advanced directives: requested   Subjective:  [name] is a 71 y.o. female who presents for Medicare Annual Wellness Visit and 3 month follow up.   Overall she reports doing well.  She has just retired two months ago.  She is enjoying spending time with her sister.  She has recently seen general surgery, [facility] [place] [facility] on July 21st of 2023., Dr Johnathan Hausen for evaluation of epidermal cyst located under the right breast.  She reports that. surgery is to be scheduled for removal.  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 Has been released by cardiology. We are monitoring  Established with [facility] as needed, takes aleve occasionally for R foot arthritis. She complains of increase in right sided back pain, most notably when moving or walking. Reports a jolting and sharp shooting pain that radiates up and down her back. She has been helping her sister clean out a friend's home, doing a lot of moving, bending, twisting. The pain has occurred for the past month.  she has a diagnosis of GERD  which is currently doing well off of medications. She is no longer taking Omeprazole.  BMI is Body mass index is 36.58 kg/m., she is still trying to be active. She is newly on phentermine/topamax, down 13 lb since last visit. She has not lost any weight in the last 3 mo. Wt Readings from Last 3 Encounters:  03/08/22 200 lb (90.7 kg)  11/29/21 199 lb 12.8 oz (90.6 kg)  06/09/21 199 lb (90.3 kg)   She does not check BP at home, today their BP is BP: 116/68  Well controlled on medications. She does not workout. She denies chest pain, shortness of breath, dizziness.   She is on cholesterol medication (rosuvastatin 20 mg daily) and denies myalgias. Her cholesterol is at goal less than 70. The cholesterol, however, elevated triglycerides at last visit was:   Lab Results  Component Value Date   CHOL 121 11/29/2021   HDL 50 11/29/2021   LDLCALC 46 11/29/2021   TRIG 177 (H) 11/29/2021   CHOLHDL 2.4 11/29/2021    She has not been working on diet and exercise for T2DM well controlled by metformin, foot ulcerations, increased appetite, nausea, paresthesia of the feet, polydipsia, polyuria, visual disturbances, vomiting and weight loss. She does have glucometer and suppliesbut hasn't been checking due to recent very well controlled values. Last A1C in  the office was:  Lab Results  Component Value Date   HGBA1C 6.1 (H) 11/29/2021   Last GFR: Lab Results  Component Value Date   GFRNONAA >60 06/02/2021   Patient is on Vitamin D supplement and at goal at recent check:    Lab Results  Component Value Date   VD25OH 112 (H) 11/29/2021      Medication Review:  Current Outpatient Medications (Endocrine & Metabolic):    metFORMIN (GLUCOPHAGE-XR) 500 MG 24 hr tablet, Take  2 tablets  2 x /day  with Meals for Diabetes   predniSONE (DELTASONE) 20 MG tablet, Take 2 tabs (40 mg) for 3 days followed by 1 tab (20 mg) for 4 days.  Current Outpatient Medications (Cardiovascular):     bisoprolol-hydrochlorothiazide (ZIAC) 10-6.25 MG tablet, TAKE 1 TABLET BY MOUTH DAILY FOR BLOOD PRESSURE   losartan (COZAAR) 50 MG tablet, Take  1 tablet Daily for BP                                                /                                       TAKE                           BY                 MOUTH   rosuvastatin (CRESTOR) 20 MG tablet, TAKE 1 TABLET BY MOUTH DAILY FOR CHOLESTEROL (Patient taking differently: TAKE 1/2 TABLET BY MOUTH DAILY FOR CHOLESTEROL)   Current Outpatient Medications (Analgesics):    aspirin EC 81 MG tablet, Take 81 mg by mouth daily.   meloxicam (MOBIC) 7.5 MG tablet, meloxicam 7.5 mg tablet  TAKE 1 TABLET BY MOUTH EVERY DAY AS NEEDED (Patient not taking: Reported on 03/08/2022)   Current Outpatient Medications (Other):    Ascorbic Acid (VITAMIN C) 1000 MG tablet, Take 1,000 mg by mouth daily.   Cholecalciferol (VITAMIN D-3) 5000 UNITS TABS, Take 2 tablets by mouth every morning.   Multiple Vitamin (MULTIVITAMIN) tablet, Take 1 tablet by mouth daily.   phentermine (ADIPEX-P) 37.5 MG tablet, TAKE ONE-HALF TO 1 TABLET BY MOUTH EVERY MORNING FOR DIETING AND WEIGHT LOSS.   topiramate (TOPAMAX) 50 MG tablet, Take  1 tablet  2 x /day  at Suppertime & Bedtime  for Dieting & Weight loss.   gabapentin (NEURONTIN) 100 MG capsule, Take 1 capsule (100 mg) TID PRN.   omeprazole (PRILOSEC) 40 MG capsule, Take 1 tab 1 hour prior to bedtime for stomach acid. (Patient not taking: Reported on 11/29/2021)  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 diabetes mellitus ([place]) 01/02/2019   Acid reflux 12/11/2017   Morbid obesity ([place]))  06/02/2014   Osteoarthritis of left knee 02/25/2014   Hyperlipidemia associated with type 2 diabetes mellitus ([place]) 12/08/2013   Vitamin D deficiency 12/08/2013   Medication management 12/08/2013   T2DM (type 2 diabetes mellitus) ([place]) 09/13/2013   Takotsubo  syndrome 07/16/2011   Stress-induced cardiomyopathy 11/18/2010   Essential hypertension 10/29/2007   Hemorrhoids 10/29/2007   DJD 10/29/2007  Screening Tests Immunization History  Administered Date(s) Administered   Influenza, High Dose Seasonal PF 04/30/2015, 05/23/2017, 05/16/2020   Influenza-Unspecified 04/15/2013, 05/06/2014, 04/29/2019, 04/25/2021   PFIZER(Purple Top)SARS-COV-2 Vaccination 10/19/2019, 11/09/2019, 05/16/2020   Pneumococcal Conjugate-13 11/01/2016   Pneumococcal Polysaccharide-23 04/30/2015   Tdap 03/11/2014   Zoster Recombinat (Shingrix) 06/28/2021   Zoster, Live 05/23/2012    Preventative care: Last colonoscopy: 2016 due 2026 Pap: 06/2018 by Dr. Phineas Real - endometrial polyp - had D&C, had follow up in 09/2018 - defers follow up  Mmg: Due 07/2022 - she has scheduled  DEXA: 01/2018 T - 0.7  NORMAL Due.    Tetanus: 2015 Influenza: 05/2021 Pneumonia: 2/2 Shingrix: check with insurance  Covid 19: 2/2, + booster   Names of Other Physician/Practitioners you currently use:  [facility] here for primary care  Dr. Delman Cheadle, 10/2020, no retinopathy abstracted   Dr. Kemper Durie, dentist, last visit 2022, goes q42m Patient Care Team: MUnk Pinto MD as PCP - General (Internal Medicine) GMelissa Noon [place] Referring Physician (Optometry) SJustice Britain MD as Consulting Physician (Orthopedic Surgery)  SURGICAL HISTORY She  has a past surgical history that includes Cesarean section (x3   last one 10/ 1988); Knee arthroscopy with medial menisectomy (Left, 02/25/2014); Cholecystectomy open (1979); Cataract extraction w/ intraocular lens  implant, bilateral (07/2009); Toe Surgery (1990); Cardiac catheterization (11/25/2004  dr sLia Foyer; Cardiac catheterization (10/10/2002   dr gLyndel Safe; and Dilatation & curettage/hysteroscopy with myosure (N/A, 10/05/2018). FAMILY HISTORY Her family history includes Arthritis in her sister; COPD in  her mother; Colon cancer in her maternal grandfather; GBerenice Primas disease in her sister; Heart attack in her father; Heart disease in her father; Hypertension in her mother and sister; Thyroid disease in her mother and sister. SOCIAL HISTORY She  reports that she has never smoked. She has never used smokeless tobacco. She reports current alcohol use. She reports that she does not use drugs.   MEDICARE WELLNESS OBJECTIVES: Physical activity: Exercise limited by: orthopedic condition(s) Cardiac risk factors: Cardiac Risk Factors include: advanced age (>58m, >6>44omen);dyslipidemia;obesity (BMI >30kg/m2) Depression/mood screen:      03/08/2022   10:41 AM  Depression screen PHQ 2/9  Decreased Interest 0  Down, Depressed, Hopeless 0  PHQ - 2 Score 0    ADLs:     03/08/2022   10:40 AM 11/29/2021    8:04 PM  In your present state of health, do you have any difficulty performing the following activities:  Hearing? 0 0  Vision? 0 0  Difficulty concentrating or making decisions? 0 0  Walking or climbing stairs? 0 0  Dressing or bathing? 0   Doing errands, shopping? 0 0  Preparing Food and eating ? N   Using the Toilet? N   In the past six months, have you accidently leaked urine? N   Do you have problems with loss of bowel control? N   Managing your Medications? N   Managing your Finances? N   Housekeeping or managing your Housekeeping? N      Cognitive Testing  Alert? Yes  Normal Appearance?Yes  Oriented to person? Yes  Place? Yes   Time? Yes  Recall of three objects?  Yes  Can perform simple calculations? Yes  Displays appropriate judgment?Yes  Can read the correct time from a watch face?Yes  EOL planning: Does Patient Have a Medical Advance Directive?: Yes Type of Advance Directive: Living will Does patient want to make changes to medical advance directive?: No - Patient declined  Review of Systems  Constitutional:  Negative for malaise/fatigue and weight loss.  HENT:   Negative for hearing loss and tinnitus.   Eyes:  Negative for blurred vision and double vision.  Respiratory:  Negative for cough, sputum production, shortness of breath and wheezing.   Cardiovascular:  Negative for chest pain, palpitations, orthopnea, claudication, leg swelling and PND.  Gastrointestinal:  Negative for abdominal pain, blood in stool, constipation, diarrhea, heartburn, melena, nausea and vomiting.  Genitourinary: Negative.   Musculoskeletal:  Positive for back pain (reports right side back pain). Negative for falls, joint pain and myalgias.  Skin:  Negative for rash.  Neurological:  Negative for dizziness, tingling, sensory change, weakness and headaches.  Endo/Heme/Allergies:  Negative for polydipsia.  Psychiatric/Behavioral: Negative.  Negative for depression, memory loss, substance abuse and suicidal ideas. The patient is not nervous/anxious and does not have insomnia.   All other systems reviewed and are negative.    Objective:     Today's Vitals   03/08/22 0927  BP: 116/68  Pulse: 87  Temp: (!) 97.2 F (36.2 C)  SpO2: 99%  Weight: 200 lb (90.7 kg)  Height: _0  (1.575 m)   Body mass index is 36.58 kg/m.  General appearance: alert, no distress, WD/WN, female HEENT: normocephalic, sclerae anicteric, TMs pearly, nares patent, no discharge or erythema, pharynx normal Oral cavity: MMM, no lesions Neck: supple, no lymphadenopathy, no thyromegaly, no masses Heart: RRR, normal S1, S2, no murmurs Lungs: CTA bilaterally, no wheezes, rhonchi, or rales Abdomen: +bs, soft, non tender, non distended, no masses, no hepatomegaly, no splenomegaly Musculoskeletal: Right tide pain with moving.  LROM d/t pain.  Tender to palpation right mid thoracic.  No swelling, no obvious deformity Extremities: no edema, no cyanosis, no clubbing Pulses: 2+ symmetric, upper and lower extremities, normal cap refill Neurological: alert, oriented x 3, CN2-12 intact, strength normal upper  extremities and lower extremities, sensation normal throughout, DTRs 2+ throughout, no cerebellar signs, gait normal Psychiatric: normal affect, behavior normal, pleasant   Medicare Attestation I have personally reviewed: The patient's medical and social history Their use of alcohol, tobacco or illicit drugs Their current medications and supplements The patient's functional ability including ADLs,fall risks, home safety risks, cognitive, and hearing and visual impairment Diet and physical activities Evidence for depression or mood disorders  The patient's weight, height, BMI, and visual acuity have been recorded in the chart.  I have made referrals, counseling, and provided education to the patient based on review of the above and I have provided the patient with a written personalized care plan for preventive services.     Darrol Jump, NP   03/08/2022

## 2022-04-13 NOTE — H&P (Signed)
REFERRING PHYSICIAN: Alesia Richards,*  PROVIDER: Joya San, MD  MRN: [id number] DOB: 1950-09-21 DATE OF ENCOUNTER: 03/04/2022  Subjective   Chief Complaint: Sebaceous Cyst   History of Present Illness: [name] is a 71 y.o. female who is seen today as an office consultation at the request of Dr. Melford Aase for evaluation of Sebaceous Cyst .   She is seen today because of a 1 cm recurrent  swelling sebaceous cyst in her right axilla. This is  right in the heart of her axilla. Currently it is not tender but at times it gets inflamed but does get quite tender. We discussed excising it under local and we will proceed with scheduling  Review of Systems: See HPI as well for other ROS.  ROS   Medical History: Past Medical History:  Diagnosis Date  Arthritis  Hyperlipidemia  Hypertension   Patient Active Problem List  Diagnosis  Acid reflux  Cervical spondylosis  CKD stage 2 due to type 2 diabetes mellitus (CMS-HCC)  Essential hypertension  Generalized osteoarthrosis, involving multiple sites  Hemorrhoids  Hyperlipidemia associated with type 2 diabetes mellitus (CMS-HCC)  Medication management  Morbid obesity (CMS-HCC)  Osteoarthritis of left knee  Pain of left hip joint  T2DM (type 2 diabetes mellitus) (CMS-HCC)  Takotsubo syndrome  Vitamin D deficiency   Past Surgical History:  Procedure Laterality Date  CESAREAN SECTION  dnc  gallbladder surgery N/A    Allergies  Allergen Reactions  Dexamethasone Other (See Comments)   Current Outpatient Medications on File Prior to Visit  Medication Sig Dispense Refill  bisoproloL-hydroCHLOROthiazide (ZIAC) 10-6.25 mg tablet Take 1 tablet by mouth once daily  losartan (COZAAR) 50 MG tablet Take 1 tablet by mouth once daily  metFORMIN (GLUCOPHAGE-XR) 500 MG XR tablet TAKE 2 TABLETS BY MOUTH TWICE DAILY WITH MEALS FOR DIABETES  phentermine (ADIPEX-P) 37.5 mg tablet TAKE ONE-HALF TO 1 TABLET BY MOUTH  EVERY MORNING FOR DIETING AND WEIGHT LOSS.  rosuvastatin (CRESTOR) 20 MG tablet Take 1 tablet by mouth once daily  topiramate (TOPAMAX) 50 MG tablet TAKE 1 TABLET BY MOUTH 2 TIMES A DAY AT SUPPERTIME AND BEDTIME FOR DIETING AND WEIGHT LOSS  aspirin 81 MG EC tablet Take 81 mg by mouth once daily   No current facility-administered medications on file prior to visit.   Family History  Problem Relation Age of Onset  Heart valve disease Father  Heart valve disease Brother    Social History   Tobacco Use  Smoking Status Never  Smokeless Tobacco Never    Social History   Socioeconomic History  Marital status: Divorced  Tobacco Use  Smoking status: Never  Smokeless tobacco: Never  Vaping Use  Vaping Use: Never used  Substance and Sexual Activity  Alcohol use: Never  Drug use: Never   Objective:   Vitals:  BP: 110/70  Pulse: 82  Temp: 36.1 C (97 F)  SpO2: 99%  Weight: 90.9 kg (200 lb 6.4 oz)  Height: 160 cm ('5\' 3"'$ )   Body mass index is 35.5 kg/m.  Physical Exam General: Normally developed and nourished white female Right axilla: there is a one cm mass in the right axilla that is consistent with a recurrent epidermal cyst  Labs, Imaging and Diagnostic Testing:   Assessment and Plan:  Epidermal cyst of the right axilla-for excision    We will schedule excision under local at [facility] day surgery  No follow-ups on file.  Senay Sistrunk Donia Pounds, MD

## 2022-04-15 ENCOUNTER — Encounter (HOSPITAL_BASED_OUTPATIENT_CLINIC_OR_DEPARTMENT_OTHER)
Admission: RE | Disposition: A | Discharge status: Home / Self Care | Payer: Self-pay | Source: Ambulatory Visit | Attending: Surgery

## 2022-04-15 ENCOUNTER — Ambulatory Visit (HOSPITAL_BASED_OUTPATIENT_CLINIC_OR_DEPARTMENT_OTHER)
Admission: RE | Admit: 2022-04-15 | Discharge: 2022-04-15 | Disposition: A | Discharge status: Home / Self Care | Payer: Medicare PPO | Source: Ambulatory Visit | Attending: Surgery | Admitting: Surgery

## 2022-04-15 ENCOUNTER — Encounter (HOSPITAL_BASED_OUTPATIENT_CLINIC_OR_DEPARTMENT_OTHER): Payer: Self-pay | Admitting: Surgery

## 2022-04-15 DIAGNOSIS — L720 Epidermal cyst: Secondary | ICD-10-CM | POA: Diagnosis not present

## 2022-04-15 DIAGNOSIS — L723 Sebaceous cyst: Secondary | ICD-10-CM | POA: Insufficient documentation

## 2022-04-15 HISTORY — PX: MASS EXCISION: SHX2000

## 2022-04-15 SURGERY — MINOR EXCISION OF MASS
Anesthesia: LOCAL | Site: Axilla | Laterality: Right

## 2022-04-15 MED ORDER — CHLORHEXIDINE GLUCONATE CLOTH 2 % EX PADS: 6.0000 | MEDICATED_PAD | Freq: Once | CUTANEOUS | Status: DC

## 2022-04-15 MED ORDER — SODIUM BICARBONATE 4.2 % IV SOLN
INTRAVENOUS | Status: DC | PRN
  Administered 2022-04-15: 10 mL via INTRAMUSCULAR

## 2022-04-15 MED ORDER — BUPIVACAINE HCL (PF) 0.5 % IJ SOLN
INTRAMUSCULAR | Status: AC
  Filled 2022-04-15: qty 30

## 2022-04-15 MED ORDER — CEFAZOLIN SODIUM-DEXTROSE 2-4 GM/100ML-% IV SOLN: 2.0000 g | INTRAVENOUS | Status: DC

## 2022-04-15 MED ORDER — SODIUM BICARBONATE 4.2 % IV SOLN
INTRAVENOUS | Status: AC
  Filled 2022-04-15: qty 10

## 2022-04-15 MED ORDER — LIDOCAINE HCL (PF) 1 % IJ SOLN
INTRAMUSCULAR | Status: AC
  Filled 2022-04-15: qty 30

## 2022-04-15 MED ORDER — LIDOCAINE-EPINEPHRINE (PF) 1 %-1:200000 IJ SOLN
INTRAMUSCULAR | Status: AC
  Filled 2022-04-15: qty 30

## 2022-04-15 SURGICAL SUPPLY — 27 items
BENZOIN TINCTURE PRP APPL 2/3 (GAUZE/BANDAGES/DRESSINGS) IMPLANT
BLADE SURG 15 STRL LF DISP TIS (BLADE) ×1 IMPLANT
BLADE SURG 15 STRL SS (BLADE) ×1
CHLORAPREP W/TINT 26 (MISCELLANEOUS) IMPLANT
DERMABOND ADVANCED (GAUZE/BANDAGES/DRESSINGS) ×1
DERMABOND ADVANCED .7 DNX12 (GAUZE/BANDAGES/DRESSINGS) ×1 IMPLANT
DRSG TEGADERM 4X4.75 (GAUZE/BANDAGES/DRESSINGS) IMPLANT
ELECT REM PT RETURN 9FT ADLT (ELECTROSURGICAL)
ELECTRODE REM PT RTRN 9FT ADLT (ELECTROSURGICAL) IMPLANT
GAUZE 4X4 16PLY ~~LOC~~+RFID DBL (SPONGE) IMPLANT
GAUZE PACKING IODOFORM 1/2 (PACKING) IMPLANT
GAUZE SPONGE 4X4 12PLY STRL LF (GAUZE/BANDAGES/DRESSINGS) IMPLANT
GLOVE BIO SURGEON STRL SZ8 (GLOVE) ×1 IMPLANT
MARKER SKIN DUAL TIP RULER LAB (MISCELLANEOUS) ×1 IMPLANT
NDL HYPO 30X.5 LL (NEEDLE) ×1 IMPLANT
NEEDLE HYPO 30X.5 LL (NEEDLE) ×1 IMPLANT
PACK BASIN DAY SURGERY FS (CUSTOM PROCEDURE TRAY) ×1 IMPLANT
PENCIL SMOKE EVACUATOR (MISCELLANEOUS) IMPLANT
STRIP CLOSURE SKIN 1/2X4 (GAUZE/BANDAGES/DRESSINGS) IMPLANT
SUT MON AB 5-0 P3 18 (SUTURE) IMPLANT
SUT PROLENE 5 0 P 3 (SUTURE) IMPLANT
SUT SILK 4 0 TIES 17X18 (SUTURE) ×1 IMPLANT
SUT VIC AB 4-0 SH 18 (SUTURE) IMPLANT
SUT VIC AB 5-0 P-3 18X BRD (SUTURE) IMPLANT
SUT VIC AB 5-0 P3 18 (SUTURE)
SWABSTICK POVIDONE IODINE SNGL (MISCELLANEOUS) IMPLANT
SYR CONTROL 10ML LL (SYRINGE) ×1 IMPLANT

## 2022-04-15 NOTE — Op Note (Signed)
[name]  July 27, 1951   04/15/2022    PCP:  [name], MD   Surgeon: Kaylyn Lim, MD, FACS  Asst:  none  Anes:  Local 1% lido with neut  Preop Dx: Hx of recurrent cyst of right axilla Postop Dx: samd  Procedure: Excision of cyst remnant Location Surgery: CDS 1 Complications: None noted  EBL:   minmal cc  Drains: none  Description of Procedure:  The patient was taken to OR 1 .  The patient was prepped  with chloroprep, draped  and a timeout was performed.  Local was injected into the skin of an area that was localized and marked with the patient in the holding area.  A broad ellipse of skin was excised with the sub cut tissue including a small sac remnant that was visible on the lateral border of the ellipse.  Cautery controlled the minimal bleeding and the wound was closed with interrupted vicryl and Dermabond.  The patient tolerated the procedure well and was taken to the PACU in stable condition.     Matt B. Hassell Done, [place], [facility], [place]

## 2022-04-15 NOTE — Discharge Instructions (Addendum)
You may shower tomorrow. Dry with hair dryer on low

## 2022-04-18 ENCOUNTER — Other Ambulatory Visit: Payer: Self-pay | Admitting: Internal Medicine

## 2022-04-19 LAB — SURGICAL PATHOLOGY

## 2022-06-03 ENCOUNTER — Ambulatory Visit (INDEPENDENT_AMBULATORY_CARE_PROVIDER_SITE_OTHER): Payer: Medicare PPO | Admitting: Nurse Practitioner

## 2022-06-03 ENCOUNTER — Encounter: Payer: Self-pay | Admitting: Nurse Practitioner

## 2022-06-03 ENCOUNTER — Ambulatory Visit: Payer: Medicare PPO | Admitting: Nurse Practitioner

## 2022-06-03 VITALS — BP 106/66 | HR 54 | Temp 97.3°F | Ht 62.0 in | Wt 203.4 lb

## 2022-06-03 DIAGNOSIS — Z1152 Encounter for screening for COVID-19: Secondary | ICD-10-CM | POA: Diagnosis not present

## 2022-06-03 DIAGNOSIS — I10 Essential (primary) hypertension: Secondary | ICD-10-CM | POA: Diagnosis not present

## 2022-06-03 DIAGNOSIS — H65112 Acute and subacute allergic otitis media (mucoid) (sanguinous) (serous), left ear: Secondary | ICD-10-CM | POA: Diagnosis not present

## 2022-06-03 DIAGNOSIS — J029 Acute pharyngitis, unspecified: Secondary | ICD-10-CM

## 2022-06-03 LAB — POCT RAPID STREP A (OFFICE): Rapid Strep A Screen: NEGATIVE

## 2022-06-03 LAB — POC COVID19 BINAXNOW: SARS Coronavirus 2 Ag: NEGATIVE

## 2022-06-03 MED ORDER — AZITHROMYCIN 250 MG PO TABS: ORAL_TABLET | ORAL | 1 refills | Status: DC

## 2022-06-03 NOTE — Progress Notes (Signed)
Assessment and Plan:  [name] was seen today for acute visit.  Diagnoses and all orders for this visit:  Essential hypertension - continue medications, DASH diet, exercise and monitor at home. Call if greater than 130/80.  Go to the ER if any chest pain, shortness of breath, nausea, dizziness, severe HA, changes vision/speech   Sore throat -     POCT rapid strep A - Strep negative Can do throat gargles with salt water and Tylenol as needed  Encounter for screening for COVID-19 -     POC COVID-19 Covid negative  Acute mucoid otitis media of left ear Push fluids, Mucinex daily, tylenol as needed If no improvement in the next 5 days notify the office -     azithromycin (ZITHROMAX) 250 MG tablet; Take 2 tablets (500 mg) on  Day 1,  followed by 1 tablet (250 mg) once daily on Days 2 through 5.       Further disposition pending results of labs. Discussed med's effects and SE's.   Over 30 minutes of exam, counseling, chart review, and critical decision making was performed.   Future Appointments  Date Time Provider [place]  06/22/2022  3:30 PM [name], MD [facility] None  12/05/2022 10:00 AM [name], MD [facility] None  03/08/2023  9:00 AM Darrol Jump, NP [facility] None    ------------------------------------------------------------------------------------------------------------------   HPI BP 106/66   Pulse (!) 54   Temp (!) 97.3 F (36.3 C)   Ht '5\' 2"'$  (1.575 m)   Wt 203 lb 6.4 oz (92.3 kg)   SpO2 99%   BMI 37.20 kg/m   71 y.o.female presents for sore throat and sinus congestion x 4 days, nasal mucus with clear and blood tinged .  Denies body aches, fever, cough, nausea, vomiting and diarrhea. Has been using Dayquil/Nyquil.   BP is currently well controlled with Ziac 10/6.25 mg QD and Losartan 50 mg QD.  Denies chest pain, shortness of breath and dizziness.  BP Readings from Last 3 Encounters:  06/03/22 106/66  04/15/22 127/74   03/08/22 116/68     BMI is Body mass index is 37.2 kg/m., she has not been working on diet and exercise. Wt Readings from Last 3 Encounters:  06/03/22 203 lb 6.4 oz (92.3 kg)  04/08/22 195 lb (88.5 kg)  03/08/22 200 lb (90.7 kg)     Past Medical History:  Diagnosis Date   Arthritis    hands, feet   Atherosclerosis of coronary artery of native heart 09/2002   per cardiac cath 10-10-2002 mild non-obstructive of LAD   CKD (chronic kidney disease), stage II    Endometrial polyp    Hemorrhoids    History of cardiomyopathy in adulthood per cardiologist, dr Lia Foyer, note in epic , Takotsuku cardiomyopathy (resolved) and release by cardiology 2012;   10-01-2018 per pt denies any symptoms past several years   per cath 10-10-2002  ef 25% with wall motion abnormalities;  follow-up cath 11-22-2004  resolution wall motion abnormalities and normal LVF;   echo 11-25-2010  ef 55%, mild LAE,  trivial MR, PR, TR   HTN (hypertension)    Hypercholesterolemia    Right sided sciatica 12/12/2017   Type 2 diabetes mellitus ([place])    followed by pcp     No Known Allergies  Current Outpatient Medications on File Prior to Visit  Medication Sig   Ascorbic Acid (VITAMIN C) 1000 MG tablet Take 1,000 mg by mouth daily.   aspirin EC 81 MG tablet Take 81  mg by mouth daily.   bisoprolol-hydrochlorothiazide (ZIAC) 10-6.25 MG tablet TAKE 1 TABLET BY MOUTH DAILY FOR BLOOD PRESSURE   Cholecalciferol (VITAMIN D-3) 5000 UNITS TABS Take 2 tablets by mouth every morning.   gabapentin (NEURONTIN) 100 MG capsule Take 1 capsule (100 mg) TID PRN.   losartan (COZAAR) 50 MG tablet Take  1 tablet Daily for BP                                                /                                       TAKE                           BY                 MOUTH   metFORMIN (GLUCOPHAGE-XR) 500 MG 24 hr tablet TAKE 2 TABLETS BY MOUTH TWICE DAILY WITH MEALS FOR DIABETES   Multiple Vitamin (MULTIVITAMIN) tablet Take 1 tablet by mouth  daily.   rosuvastatin (CRESTOR) 20 MG tablet TAKE 1 TABLET BY MOUTH DAILY FOR CHOLESTEROL (Patient taking differently: TAKE 1/2 TABLET BY MOUTH DAILY FOR CHOLESTEROL)   topiramate (TOPAMAX) 50 MG tablet Take  1 tablet  2 x /day  at Suppertime & Bedtime  for Dieting & Weight loss.   phentermine (ADIPEX-P) 37.5 MG tablet TAKE ONE-HALF TO 1 TABLET BY MOUTH EVERY MORNING FOR DIETING AND WEIGHT LOSS. (Patient not taking: Reported on 06/03/2022)   No current facility-administered medications on file prior to visit.    ROS: all negative except above.   Physical Exam:  BP 106/66   Pulse (!) 54   Temp (!) 97.3 F (36.3 C)   Ht '5\' 2"'$  (1.575 m)   Wt 203 lb 6.4 oz (92.3 kg)   SpO2 99%   BMI 37.20 kg/m   General Appearance: Well nourished, in no apparent distress. Eyes: PERRLA, EOMs, conjunctiva no swelling or erythema Sinuses: No Frontal/maxillary tenderness ENT/Mouth: Ext aud canals clear, TR TM normal, L TM bulging with mucus in inner ear. No erythema, swelling, or exudate on post pharynx.  Tonsils not swollen or erythematous. Hearing normal.  Neck: Supple, thyroid normal.  Respiratory: Respiratory effort normal, BS equal bilaterally without rales, rhonchi, wheezing or stridor.  Cardio: RRR with no MRGs. Brisk peripheral pulses without edema.  Abdomen: Soft, + BS.  Non tender, no guarding, rebound, hernias, masses. Lymphatics: Non tender without lymphadenopathy.  Musculoskeletal: Full ROM, 5/5 strength, normal gait.  Skin: Warm, dry without rashes, lesions, ecchymosis.  Neuro: Cranial nerves intact. Normal muscle tone, no cerebellar symptoms. Sensation intact.  Psych: Awake and oriented X 3, normal affect, Insight and Judgment appropriate.     Alycia Rossetti, NP 9:56 AM [facility]

## 2022-06-03 NOTE — Patient Instructions (Signed)
Zithromax as directed Mucinex daily for the next 7 days  Otitis Media, Adult  Otitis media occurs when there is inflammation and fluid in the middle ear with signs and symptoms of an acute infection. The middle ear is a part of the ear that contains bones for hearing as well as air that helps send sounds to the brain. When infected fluid builds up in this space, it causes pressure and can lead to an ear infection. The eustachian tube connects the middle ear to the back of the nose (nasopharynx) and normally allows air into the middle ear. If the eustachian tube becomes blocked, fluid can build up and become infected. What are the causes? This condition is caused by a blockage in the eustachian tube. This can be caused by mucus or by swelling of the tube. Problems that can cause a blockage include: A cold or other upper respiratory infection. Allergies. An irritant, such as tobacco smoke. Enlarged adenoids. The adenoids are areas of soft tissue located high in the back of the throat, behind the nose and the roof of the mouth. They are part of the body's defense system (immune system). A mass in the nasopharynx. Damage to the ear caused by pressure changes (barotrauma). What increases the risk? You are more likely to develop this condition if you: Smoke or are exposed to tobacco smoke. Have an opening in the roof of your mouth (cleft palate). Have gastroesophageal reflux. Have an immune system disorder. What are the signs or symptoms? Symptoms of this condition include: Ear pain. Fever. Decreased hearing. Tiredness (lethargy). Fluid leaking from the ear, if the eardrum is ruptured or has burst. Ringing in the ear. How is this diagnosed?  This condition is diagnosed with a physical exam. During the exam, your health care provider will use an instrument called an otoscope to look in your ear and check for redness, swelling, and fluid. He or she will also ask about your symptoms. Your  health care provider may also order tests, such as: A pneumatic otoscopy. This is a test to check the movement of the eardrum. It is done by squeezing a small amount of air into the ear. A tympanogram. This is a test that shows how well the eardrum moves in response to air pressure in the ear canal. It provides a graph for your health care provider to review. How is this treated? This condition can go away on its own within 3-5 days. But if the condition is caused by a bacterial infection and does not go away on its own, or if it keeps coming back, your health care provider may: Prescribe antibiotic medicine to treat the infection. Prescribe or recommend medicines to control pain. Follow these instructions at home: Take over-the-counter and prescription medicines only as told by your health care provider. If you were prescribed an antibiotic medicine, take it as told by your health care provider. Do not stop taking the antibiotic even if you start to feel better. Keep all follow-up visits. This is important. Contact a health care provider if: You have bleeding from your nose. There is a lump on your neck. You are not feeling better in 5 days. You feel worse instead of better. Get help right away if: You have severe pain that is not controlled with medicine. You have swelling, redness, or pain around your ear. You have stiffness in your neck. A part of your face is not moving (paralyzed). The bone behind your ear (mastoid bone) is tender when  you touch it. You develop a severe headache. Summary Otitis media is redness, soreness, and swelling of the middle ear, usually resulting in pain and decreased hearing. This condition can go away on its own within 3-5 days. If the problem does not go away in 3-5 days, your health care provider may give you medicines to treat the infection. If you were prescribed an antibiotic medicine, take it as told by your health care provider. Follow all  instructions that were given to you by your health care provider. This information is not intended to replace advice given to you by your health care provider. Make sure you discuss any questions you have with your health care provider. Document Revised: 11/09/2020 Document Reviewed: 11/09/2020 Elsevier Patient Education  [place].

## 2022-06-13 ENCOUNTER — Ambulatory Visit: Payer: Medicare PPO | Admitting: Internal Medicine

## 2022-06-22 ENCOUNTER — Ambulatory Visit (INDEPENDENT_AMBULATORY_CARE_PROVIDER_SITE_OTHER): Payer: Medicare PPO | Admitting: Internal Medicine

## 2022-06-22 ENCOUNTER — Encounter: Payer: Self-pay | Admitting: Internal Medicine

## 2022-06-22 VITALS — BP 124/72 | HR 79 | Temp 97.9°F | Resp 17 | Ht 62.0 in | Wt 208.0 lb

## 2022-06-22 DIAGNOSIS — E785 Hyperlipidemia, unspecified: Secondary | ICD-10-CM

## 2022-06-22 DIAGNOSIS — Z79899 Other long term (current) drug therapy: Secondary | ICD-10-CM | POA: Diagnosis not present

## 2022-06-22 DIAGNOSIS — E1169 Type 2 diabetes mellitus with other specified complication: Secondary | ICD-10-CM

## 2022-06-22 DIAGNOSIS — E559 Vitamin D deficiency, unspecified: Secondary | ICD-10-CM | POA: Diagnosis not present

## 2022-06-22 DIAGNOSIS — K219 Gastro-esophageal reflux disease without esophagitis: Secondary | ICD-10-CM | POA: Diagnosis not present

## 2022-06-22 DIAGNOSIS — N182 Chronic kidney disease, stage 2 (mild): Secondary | ICD-10-CM | POA: Diagnosis not present

## 2022-06-22 DIAGNOSIS — I10 Essential (primary) hypertension: Secondary | ICD-10-CM

## 2022-06-22 DIAGNOSIS — E1122 Type 2 diabetes mellitus with diabetic chronic kidney disease: Secondary | ICD-10-CM

## 2022-06-22 MED ORDER — TIRZEPATIDE 2.5 MG/0.5ML ~~LOC~~ SOAJ: SUBCUTANEOUS | 0 refills | Status: DC

## 2022-06-22 NOTE — Patient Instructions (Signed)

## 2022-06-22 NOTE — Progress Notes (Signed)
Future Appointments  Date Time Provider Department  06/22/2022                6 mo ov  3:30 PM [name], MD [facility]  12/05/2022                cpe 10:00 AM [name], MD Jefferey Pica, NP [facility]         This very nice 71 y.o. DWF with  HTN, HLD, T2_NIDDM, GERD and Vitamin D Deficiency. Presents for 6 month f/u.         HTN predates circa  1994.   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 Heart Cath in 2006 was normal and later in 2009, she had  a Negative Myoview. Patient's BP has been controlled at home and patient denies any cardiac symptoms as chest pain, palpitations, shortness of breath, dizziness or ankle swelling. Today's BP is at goal - 124/72.        Patient's hyperlipidemia is controlled with diet and Rosuvastatin.  Patient denies myalgias or other medication SE's. Last lipids were at goal except ele\vated Trig's :  Lab Results  Component Value Date   CHOL 121 11/29/2021   HDL 50 11/29/2021   LDLCALC 46 11/29/2021   TRIG 177 (H) 11/29/2021   CHOLHDL 2.4 11/29/2021         Patient has hx/o Morbid Obesity (BMI 38 +) and hx/o T2_NIDDM (2007) w/CKD2  (GFR 67) and patient denies reactive hypoglycemic symptoms, visual blurring, diabetic polys or paresthesias. Last A1c was not at goal :  Lab Results  Component Value Date   HGBA1C 6.1 (H) 11/29/2021         Finally, patient has history of Vitamin D Deficiency ("20" /2008) and last Vitamin D was at goal :  Lab Results  Component Value Date   VD25OH 112 (H) 11/29/2021       Current Outpatient Medications  Medication Instructions   aspirin EC  81 mg Daily   bisoprolol-hctz10-6.25 MG tablet TAKE 1 TABLET  DAILY    Cholecalciferol (VITAMIN D-3) 5000 UNITS TABS 2 tablets  Every morning   losartan (COZAAR) 50 MG tablet Take  1 tablet Daily    metFORMIN -XR  500 MG TAKE 2 TABLETS  TWICE DAILY    Multiple Vitamin  1 tablet, Oral, Daily    rosuvastatin  20 MG tablet TAKE 1 TABLET DAILY    vitamin C  1,000 mg  Daily    No Known Allergies   Past Medical History:  Diagnosis Date   Arthritis    hands, feet   Atherosclerosis of coronary artery of native heart 09/2002   per cardiac cath 10-10-2002 mild non-obstructive of LAD   CKD (chronic kidney disease), stage II    Endometrial polyp    Hemorrhoids    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) and release by cardiology 2012;   10-01-2018 per pt denies any symptoms past several years   per cath 10-10-2002  ef 25% with wall motion abnormalities;  follow-up cath 11-22-2004  resolution wall motion abnormalities and normal LVF;   echo 11-25-2010  ef 55%, mild LAE,  trivial MR, PR, TR   HTN (hypertension)    Hypercholesterolemia    Right sided sciatica 12/12/2017   Type 2 diabetes mellitus ([place])    followed by pcp     Health Maintenance  Topic Date Due   DEXA SCAN  02/04/2020   COVID-19 Vaccine (4 - Booster for Pfizer series) 07/11/2020   Zoster Vaccines- Shingrix (2 of 2) 08/23/2021   FOOT EXAM  11/16/2021   HEMOGLOBIN A1C  12/08/2021   INFLUENZA VACCINE  03/15/2022   MAMMOGRAM  08/11/2022   OPHTHALMOLOGY EXAM  11/10/2022   TETANUS/TDAP  03/11/2024   Pneumonia Vaccine 80+ Years old  Completed   Hepatitis C Screening  Completed   HPV VACCINES  Aged Out     Immunization History  Administered Date(s) Administered   Influenza, High Dose Seasonal PF 04/30/2015, 05/23/2017, 05/16/2020   Influenza 04/29/2019, 04/25/2021   PFIZER SARS-COV-2 Vacc 10/19/2019, 11/09/2019, 05/16/2020   Pneumococcal -13 11/01/2016   Pneumococcal -23 04/30/2015   Tdap 03/11/2014   Zoster Recombinat (Shingrix) 06/28/2021   Zoster, Live 05/23/2012    Last Colon - 01/23/2015 - Dr Deatra Ina - Recc 10 yr  F/u  Colon due June 2026  Last MGM -  08/11/2021   Past Surgical History:  Procedure Laterality Date   CARDIAC CATHETERIZATION  11/25/2004  dr  Lia Foyer   mild non-obstrucitve coronary atherosclerosis involving LAD,  normal LVF and resolution wall abnormalities from previous cath   CARDIAC CATHETERIZATION  10/10/2002   dr Lyndel Safe   mild non-obstructive cad,  ef 25% with anterior akinesis, apical hypokinesis and inferior dyskinesis   CATARACT EXTRACTION W/ INTRAOCULAR LENS  IMPLANT, BILATERAL  07/2009   CESAREAN SECTION  x3   last one 10/ 1988    W/ BILATERAL TUBAL LIGATION WITH LAST C/S   [place] N/A 10/05/2018   Procedure: [place];  Surgeon: Anastasio Auerbach, MD;  Location: [facility];  Service: Gynecology;  Laterality: N/A;   KNEE ARTHROSCOPY WITH MEDIAL MENISECTOMY Left 02/25/2014   Procedure: LEFT KNEE ARTHROSCOPY WITH MEDIAL AND LATERAL MENISECTOMY, MICROFRACTURE, SYNOVECTOMY SUPRA PATELLA;  Surgeon: Tobi Bastos, MD;  Location: [facility];  Service: Orthopedics;  Laterality: Left;   TOE SURGERY  1990   BILATERAL GREAT TOE'S     Family History  Problem Relation Age of Onset   Hypertension Mother    COPD Mother    Thyroid disease Mother    Heart attack Father    Heart disease Father    Thyroid disease Sister    Arthritis Sister        Rheumatoid   Hypertension Sister    Berenice Primas' disease Sister    Colon cancer Maternal Grandfather      Social History   Tobacco Use   Smoking status: Never   Smokeless tobacco: Never  Vaping Use   Vaping Use: Never used  Substance Use Topics   Alcohol use: Yes    Alcohol/week: 0.0 standard drinks    Comment: rare; twice yearly   Drug use: Never      ROS Constitutional: Denies fever, chills, weight loss/gain, headaches, insomnia,  night sweats, and change in appetite. Does c/o fatigue. Eyes: Denies redness, blurred vision, diplopia, discharge, itchy, watery eyes.  ENT: Denies discharge, congestion, post nasal drip, epistaxis, sore throat, earache,  hearing loss, dental pain, Tinnitus, Vertigo, Sinus pain, snoring.  Cardio: Denies chest pain, palpitations, irregular heartbeat, syncope, dyspnea, diaphoresis, orthopnea, PND, claudication, edema Respiratory: denies cough, dyspnea, DOE, pleurisy, hoarseness, laryngitis, wheezing.  Gastrointestinal: Denies dysphagia, heartburn, reflux, water brash, pain, cramps, nausea, vomiting, bloating, diarrhea, constipation, hematemesis, melena, hematochezia, jaundice, hemorrhoids Genitourinary: Denies dysuria, frequency, urgency, nocturia, hesitancy, discharge, hematuria, flank pain Breast: Breast lumps, nipple  discharge, bleeding.  Musculoskeletal: Denies arthralgia, myalgia, stiffness, Jt. Swelling, pain, limp, and strain/sprain. Denies falls. Skin: Denies puritis, rash, hives, warts, acne, eczema, changing in skin lesion Neuro: No weakness, tremor, incoordination, spasms, paresthesia, pain Psychiatric: Denies confusion, memory loss, sensory loss. Denies Depression. Endocrine: Denies change in weight, skin, hair change, nocturia, and paresthesia, diabetic polys, visual blurring, hyper / hypo glycemic episodes.  Heme/Lymph: No excessive bleeding, bruising, enlarged lymph nodes.  Physical Exam  BP 124/72   Pulse 79   Temp 97.9 F (36.6 C)   Resp 17   Ht '5\' 2"'$  (1.575 m)   Wt 208 lb (94.3 kg)   SpO2 98%   BMI 38.04 kg/m   General Appearance: Over nourished and in no apparent distress.  Eyes: PERRLA, EOMs, conjunctiva no swelling or erythema, normal fundi and vessels. Sinuses: No frontal/maxillary tenderness ENT/Mouth: EACs patent / TMs  nl. Nares clear without erythema, swelling, mucoid exudates. Oral hygiene is good. No erythema, swelling, or exudate. Tongue normal, non-obstructing. Tonsils not swollen or erythematous. Hearing normal.  Neck: Supple, thyroid not palpable. No bruits, nodes or JVD. Respiratory: Respiratory effort normal.  BS equal and clear bilateral without rales, rhonci, wheezing  or stridor. Cardio: Heart sounds are normal with regular rate and rhythm and no murmurs, rubs or gallops. Peripheral pulses are normal and equal bilaterally without edema. No aortic or femoral bruits. Chest: symmetric with normal excursions and percussion. Breasts: deferred to recent Negative MGM Abdomen: Flat, soft with bowel sounds active. Nontender, no guarding, rebound, hernias, masses, or organomegaly.  Lymphatics: Non tender without lymphadenopathy.  Musculoskeletal: Full ROM all peripheral extremities, joint stability, 5/5 strength, and normal gait. Skin: Warm and dry without rashes, lesions, cyanosis, clubbing or  ecchymosis. Several sebaceous cysts of anterior Rt axilla.  Neuro: Cranial nerves intact, reflexes equal bilaterally. Normal muscle tone, no cerebellar symptoms. Sensation intact.  Pysch: Alert and oriented X 3, normal affect, Insight and Judgment appropriate.    Assessment and Plan   1. Essential hypertension  - CBC with Differential/Platelet - COMPLETE METABOLIC PANEL WITH GFR - Magnesium - TSH  2. Hyperlipidemia associated with type 2 diabetes mellitus (HCC)  - Lipid panel - TSH  3. Type 2 diabetes mellitus with stage 2 chronic kidney  disease, without long-term current use of insulin (HCC)  - Hemoglobin A1c - Insulin, random  4. Vitamin D deficiency  - VITAMIN D 25 Hydroxy   5. Gastroesophageal reflux disease  - CBC with Differential/Platelet  6. Medication management  - CBC with Differential/Platelet - COMPLETE METABOLIC PANEL WITH GFR - Magnesium - Lipid panel - TSH - Hemoglobin A1c - Insulin, random - VITAMIN D 25 Hydroxy         Patient was counseled in prudent diet to achieve/maintain BMI less than 25 for weight control, BP monitoring, regular exercise and medications. Discussed med's effects and SE's. Screening labs and tests as requested with regular follow-up as recommended. Over 40 minutes of exam, counseling, chart review and high  complex critical decision making was performed.   Kirtland Bouchard, MD

## 2022-06-23 ENCOUNTER — Ambulatory Visit: Payer: Medicare PPO | Admitting: Internal Medicine

## 2022-06-23 LAB — CBC WITH DIFFERENTIAL/PLATELET
Absolute Monocytes: 423 cells/uL (ref 200–950)
Basophils Absolute: 52 cells/uL (ref 0–200)
Basophils Relative: 0.8 %
Eosinophils Absolute: 221 cells/uL (ref 15–500)
Eosinophils Relative: 3.4 %
HCT: 39.1 % (ref 35.0–45.0)
Hemoglobin: 13 g/dL (ref 11.7–15.5)
Lymphs Abs: 2009 cells/uL (ref 850–3900)
MCH: 29 pg (ref 27.0–33.0)
MCHC: 33.2 g/dL (ref 32.0–36.0)
MCV: 87.3 fL (ref 80.0–100.0)
MPV: 11.8 fL (ref 7.5–12.5)
Monocytes Relative: 6.5 %
Neutro Abs: 3796 cells/uL (ref 1500–7800)
Neutrophils Relative %: 58.4 %
Platelets: 285 10*3/uL (ref 140–400)
RBC: 4.48 10*6/uL (ref 3.80–5.10)
RDW: 12.8 % (ref 11.0–15.0)
Total Lymphocyte: 30.9 %
WBC: 6.5 10*3/uL (ref 3.8–10.8)

## 2022-06-23 LAB — COMPLETE METABOLIC PANEL WITH GFR
AG Ratio: 2.2 (calc) (ref 1.0–2.5)
ALT: 18 U/L (ref 6–29)
AST: 16 U/L (ref 10–35)
Albumin: 4.4 g/dL (ref 3.6–5.1)
Alkaline phosphatase (APISO): 49 U/L (ref 37–153)
BUN: 16 mg/dL (ref 7–25)
CO2: 26 mmol/L (ref 20–32)
Calcium: 10.4 mg/dL (ref 8.6–10.4)
Chloride: 103 mmol/L (ref 98–110)
Creat: 0.63 mg/dL (ref 0.60–1.00)
Globulin: 2 g/dL (calc) (ref 1.9–3.7)
Glucose, Bld: 137 mg/dL — ABNORMAL HIGH (ref 65–99)
Potassium: 4.4 mmol/L (ref 3.5–5.3)
Sodium: 140 mmol/L (ref 135–146)
Total Bilirubin: 0.7 mg/dL (ref 0.2–1.2)
Total Protein: 6.4 g/dL (ref 6.1–8.1)
eGFR: 94 mL/min/{1.73_m2} (ref >=60)

## 2022-06-23 LAB — LIPID PANEL
Cholesterol: 120 mg/dL (ref <200)
HDL: 57 mg/dL (ref >=50)
LDL Cholesterol (Calc): 37 mg/dL (calc)
Non-HDL Cholesterol (Calc): 63 mg/dL (calc) (ref <130)
Total CHOL/HDL Ratio: 2.1 (calc) (ref <5.0)
Triglycerides: 179 mg/dL — ABNORMAL HIGH (ref <150)

## 2022-06-23 LAB — TSH: TSH: 3.02 mIU/L (ref 0.40–4.50)

## 2022-06-23 LAB — INSULIN, RANDOM: Insulin: 47.3 u[IU]/mL — ABNORMAL HIGH

## 2022-06-23 LAB — HEMOGLOBIN A1C
Hgb A1c MFr Bld: 6.3 % of total Hgb — ABNORMAL HIGH (ref <5.7)
Mean Plasma Glucose: 134 mg/dL
eAG (mmol/L): 7.4 mmol/L

## 2022-06-23 LAB — VITAMIN D 25 HYDROXY (VIT D DEFICIENCY, FRACTURES): Vit D, 25-Hydroxy: 95 ng/mL (ref 30–100)

## 2022-06-23 LAB — MAGNESIUM: Magnesium: 1.8 mg/dL (ref 1.5–2.5)

## 2022-06-24 NOTE — Progress Notes (Signed)
<><><><><><><><><><><><><><><><><><><><><><><><><><><><><><><><><> <><><><><><><><><><><><><><><><><><><><><><><><><><><><><><><><><> -   Test results slightly outside the reference range are not unusual. If there is anything important, I will review this with you,  otherwise it is considered normal test values.  If you have further questions,  please do not hesitate to contact me at the office or via My Chart.  <><><><><><><><><><><><><><><><><><><><><><><><><><><><><><><><><> <><><><><><><><><><><><><><><><><><><><><><><><><><><><><><><><><>  -   Total Chol = 120   & LDL Chol = 37   - Both  Excellent   - Very low risk for Heart Attack  / Stroke  - Recommend decrease Crestor /Rosuvastatin 40 mg from                                        1/2 tablet ( 20 mg) /day to 1/2 tablet 3 x /week on MWF <><><><><><><><><><><><><><><><><><><><><><><><><><><><><><><><><>  -  Vitamin D = 95 - Excellent - Please keep dose same  <><><><><><><><><><><><><><><><><><><><><><><><><><><><><><><><><>  -  All Else - CBC - Kidneys - Electrolytes - Liver - Magnesium & Thyroid    - all  Normal / OK <><><><><><><><><><><><><><><><><><><><><><><><><><><><><><><><><>  -  Keep up the Great Work  !  <><><><><><><><><><><><><><><><><><><><><><><><><><><><><><><><><> <><><><><><><><><><><><><><><><><><><><><><><><><><><><><><><><><>              <><><><><><><><><><><><><><><><><><><><><><><><><><><><><><><><><>  -  A1c = 6.3% still not at goal  ( which is less than 5.7% ) .   So please work a ddd

## 2022-07-19 ENCOUNTER — Other Ambulatory Visit: Payer: Self-pay | Admitting: Internal Medicine

## 2022-07-28 ENCOUNTER — Other Ambulatory Visit: Payer: Self-pay | Admitting: Nurse Practitioner

## 2022-07-28 DIAGNOSIS — E782 Mixed hyperlipidemia: Secondary | ICD-10-CM

## 2022-08-10 DIAGNOSIS — M25512 Pain in left shoulder: Secondary | ICD-10-CM | POA: Diagnosis not present

## 2022-08-10 DIAGNOSIS — M25511 Pain in right shoulder: Secondary | ICD-10-CM | POA: Diagnosis not present

## 2022-08-19 ENCOUNTER — Other Ambulatory Visit: Payer: Self-pay | Admitting: Nurse Practitioner

## 2022-08-22 DIAGNOSIS — Z1231 Encounter for screening mammogram for malignant neoplasm of breast: Secondary | ICD-10-CM | POA: Diagnosis not present

## 2022-08-22 LAB — HM MAMMOGRAPHY

## 2022-08-27 DIAGNOSIS — M25511 Pain in right shoulder: Secondary | ICD-10-CM | POA: Diagnosis not present

## 2022-09-06 ENCOUNTER — Encounter: Payer: Self-pay | Admitting: Internal Medicine

## 2022-09-15 DIAGNOSIS — Z780 Asymptomatic menopausal state: Secondary | ICD-10-CM | POA: Diagnosis not present

## 2022-09-15 DIAGNOSIS — M85851 Other specified disorders of bone density and structure, right thigh: Secondary | ICD-10-CM | POA: Diagnosis not present

## 2022-09-15 LAB — HM DEXA SCAN

## 2022-09-19 ENCOUNTER — Other Ambulatory Visit: Payer: Self-pay | Admitting: Nurse Practitioner

## 2022-09-21 DIAGNOSIS — M25811 Other specified joint disorders, right shoulder: Secondary | ICD-10-CM | POA: Diagnosis not present

## 2022-09-21 NOTE — Progress Notes (Unsigned)
3 MONTH FOLLOW UP  Assessment:    Essential hypertension Continue medications: Ziac 10/6.'25mg'$  QD Monitor blood pressure at home; call if consistently over 130/80 Continue DASH diet.   Reminder to go to the ER if any CP, SOB, nausea, dizziness, severe HA, changes vision/speech, left arm numbness and tingling and jaw pain.  Type 2 diabetes mellitus with stage 2 chronic kidney disease, without long-term current use of insulin [facility]) Education: Reviewed 'ABCs' of diabetes management (respective goals in parentheses):  A1C (<7), blood pressure (<130/80), and cholesterol (LDL <70) Eye Exam yearly and Dental Exam every 6 months- up to date metformin 4 tabs daily  Mounjaro 2.5 mg SQ QW Dietary recommendations Physical Activity recommendations Foot exam up to date   Hyperlipidemia associated with type 2 diabetes mellitus (HCC) -     Lipid panel check lipids, LDL goal <70 decrease fatty foods increase activity.   CKD stage 2 due to type 2 diabetes mellitus (HCC) -     COMPLETE METABOLIC PANEL WITH GFR Increase fluids, avoid NSAIDS, monitor sugars, will monitor  Vitamin D deficiency Continue supplementation Check vitamin D level q 6 months or as needed  Morbid obesity ([place])- BMI 35 with htn, hld, T2DM Long discussion about weight loss, diet, and exercise Recommended diet heavy in fruits and veggies and low in animal meats, cheeses, and dairy products, appropriate calorie intake Discussed appropriate weight for height, she would prefer not to set weight goal, working on general good habits with goal to normalize A1C and get off meds Follow up at next visit - A1C  Medication management CBC, CMP/GFR  Gastroesophageal reflux disease, esophagitis presence not specified Well managed without medications Discussed diet, avoiding triggers and other lifestyle changes   Eustachian tube dysfunction bilaterally Use Mucinex twice a day for the next 5-7 days Push fluids If develops fever or  symptoms are not improving notify the office   Over 40 minutes of exam, counseling, chart review and critical decision making was performed Future Appointments  Date Time Provider [place]  12/05/2022 10:00 AM [name], MD [facility] None  03/08/2023  9:00 AM Darrol Jump, NP [facility] None     Subjective:  [name] is a 72 y.o. female who presents for 3 month follow up. has Essential hypertension; Hemorrhoids; DJD; Stress-induced cardiomyopathy; T2DM (type 2 diabetes mellitus) ([place]); Hyperlipidemia associated with type 2 diabetes mellitus ([place]); Vitamin D deficiency; Medication management; Osteoarthritis of left knee; Morbid obesity ([place])) ; Takotsubo syndrome; Acid reflux; and CKD stage 2 due to type 2 diabetes mellitus (HCC) on their problem list.   She has been having popping in her right ear and congestion.  Did have Covid in 08/2022  She was suggested to have reverse shoulder replacement of right shoulder suggested by orthopedics.   she has a diagnosis of GERD - doing well without medication, controlled solely by diet.   BMI is Body mass index is 35.04 kg/m., she is still trying to be active. She continues to  use Mounjaro 2.5 mg SQ QW . Drinking more water and stopped diet drinks. 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  Has been released by cardiology  as no concerning sx in over 5 years.   She has a 12 year history of hypertension currently managed with Ziac 10/6.'25mg'$  daily, today their BP is BP: 122/64  BP Readings from Last 3 Encounters:  09/22/22 122/64  06/22/22 124/72  06/03/22 106/66  She does workout. She denies chest pain, shortness of breath, dizziness.   She is on  cholesterol medication (rosuvastatin 20 mg daily) and denies myalgias. Her cholesterol is at goal less than 70. The cholesterol last visit was:   Lab Results  Component Value Date   CHOL 120 06/22/2022   HDL 57 06/22/2022   LDLCALC 37 06/22/2022   TRIG 179 (H) 06/22/2022   CHOLHDL 2.1 06/22/2022    She has  been working on diet and exercise for T2DM which was diagnosed 9 years ago.  Well controlled by metformin '500mg'$  2 tabs BID and Mounjaro 2.5 mg SQ QW, foot ulcerations, increased appetite, nausea, paresthesia of the feet, polydipsia, polyuria, visual disturbances, vomiting and weight loss. She does have glucometer and supplies but hasn't been checking due to recent very well controlled values. Last A1C in the office was:  Lab Results  Component Value Date   HGBA1C 6.3 (H) 06/22/2022   Last GFR: Lab Results  Component Value Date   EGFR 94 06/22/2022    Patient is on Vitamin D supplement and at goal at recent check:    Lab Results  Component Value Date   VD25OH 95 06/22/2022      Medication Review:  Current Outpatient Medications (Endocrine & Metabolic):    metFORMIN (GLUCOPHAGE-XR) 500 MG 24 hr tablet, TAKE 2 TABLETS BY MOUTH TWICE DAILY WITH MEALS FOR DIABETES   MOUNJARO 2.5 MG/0.5ML Pen, ADMINISTER 2.5 MG UNDER THE SKIN EVERY 7 DAYS FOR DIABETES  Current Outpatient Medications (Cardiovascular):    bisoprolol-hydrochlorothiazide (ZIAC) 10-6.25 MG tablet, TAKE 1 TABLET BY MOUTH DAILY FOR BLOOD PRESSURE   losartan (COZAAR) 50 MG tablet, Take  1 tablet Daily for BP                                                /                                       TAKE                           BY                 MOUTH   rosuvastatin (CRESTOR) 20 MG tablet, TAKE 1 TABLET BY MOUTH DAILY FOR CHOLESTEROL (Patient taking differently: TAKE 1 TABLET BY MOUTH DAILY FOR CHOLESTEROL M,W,F)   Current Outpatient Medications (Analgesics):    aspirin EC 81 MG tablet, Take 81 mg by mouth  daily.   Current Outpatient Medications (Other):    Ascorbic Acid (VITAMIN C) 1000 MG tablet, Take 1,000 mg by mouth daily.   Cholecalciferol (VITAMIN D-3) 5000 UNITS TABS, Take 2 tablets by mouth every morning.   Multiple Vitamin (MULTIVITAMIN) tablet, Take 1 tablet by mouth daily.  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 diabetes mellitus ([place]) 01/02/2019  Acid reflux 12/11/2017   Morbid obesity ([place]))  06/02/2014   Osteoarthritis of left knee 02/25/2014   Hyperlipidemia associated with type 2 diabetes mellitus ([place]) 12/08/2013   Vitamin D deficiency 12/08/2013   Medication management 12/08/2013   T2DM (type 2 diabetes mellitus) ([place]) 09/13/2013   Takotsubo syndrome 07/16/2011   Stress-induced cardiomyopathy 11/18/2010   Essential hypertension 10/29/2007   Hemorrhoids 10/29/2007   DJD 10/29/2007     SURGICAL HISTORY She  has a past surgical history that includes Cesarean section (x3   last one 10/ 1988); Knee arthroscopy with medial menisectomy (Left, 02/25/2014); Cholecystectomy open (1979); Cataract extraction w/ intraocular lens  implant, bilateral (07/2009); Toe Surgery (1990); Cardiac catheterization (11/25/2004  dr Lia Foyer); Cardiac catheterization (10/10/2002   dr Lyndel Safe); Dilatation & curettage/hysteroscopy with myosure (N/A, 10/05/2018); and Mass excision (Right, 04/15/2022). FAMILY HISTORY Her family history includes Arthritis in her sister; COPD in her mother; Colon cancer in her maternal grandfather; Berenice Primas' disease in her sister; Heart attack in her father; Heart disease in her father; Hypertension in her mother and sister; Thyroid disease in her mother and sister. SOCIAL HISTORY She  reports that she has never smoked. She has never used smokeless tobacco. She reports current alcohol use. She reports that she does not use drugs.   Review of Systems  Constitutional:  Negative for  malaise/fatigue and weight loss.  HENT:  Positive for congestion. Negative for hearing loss and tinnitus.        Popping in ears  Eyes:  Negative for blurred vision and double vision.  Respiratory:  Negative for cough, sputum production, shortness of breath and wheezing.   Cardiovascular:  Negative for chest pain, palpitations, orthopnea, claudication, leg swelling and PND.  Gastrointestinal:  Negative for abdominal pain, blood in stool, constipation, diarrhea, heartburn, melena, nausea and vomiting.  Genitourinary:  Negative for dysuria, flank pain, frequency, hematuria and urgency.  Musculoskeletal:  Negative for falls, joint pain and myalgias.  Skin:  Negative for rash.  Neurological:  Negative for dizziness, tingling, sensory change, weakness and headaches.  Endo/Heme/Allergies:  Negative for polydipsia.  Psychiatric/Behavioral: Negative.  Negative for depression, memory loss, substance abuse and suicidal ideas. The patient is not nervous/anxious and does not have insomnia.   All other systems reviewed and are negative.    Objective:     Today's Vitals   09/22/22 0917  BP: 122/64  Pulse: 87  Temp: 97.7 F (36.5 C)  SpO2: 97%  Weight: 191 lb 9.6 oz (86.9 kg)  Height: '5\' 2"'$  (1.575 m)   Body mass index is 35.04 kg/m.  General appearance: alert, no distress, WD/WN, female HEENT: normocephalic, sclerae anicteric, TMs pearly, fluid noted bilaterally, no erythema , nares patent, no discharge or erythema, pharynx normal Oral cavity: MMM, no lesions Neck: supple, no lymphadenopathy, no thyromegaly, no masses Heart: RRR, normal S1, S2, no murmurs Lungs: CTA bilaterally, no wheezes, rhonchi, or rales Abdomen: +bs, soft, non tender, non distended, no masses, no hepatomegaly, no splenomegaly. No CVA tenderness. Foley bag to right leg; golden urine with sediment, not murky. No clots or gross hematuria.  Musculoskeletal: nontender, no swelling, no obvious deformity. Right shoulder limited  ROM Extremities: no edema, no cyanosis, no clubbing Pulses: 2+ symmetric, upper and lower extremities, normal cap refill Neurological: alert, oriented x 3, CN2-12 intact, strength normal upper extremities and lower extremities, sensation normal throughout, DTRs 2+ throughout, no cerebellar signs, gait normal Psychiatric: normal affect, behavior normal, pleasant    Alycia Rossetti, NP   09/22/2022

## 2022-09-22 ENCOUNTER — Encounter: Payer: Self-pay | Admitting: Nurse Practitioner

## 2022-09-22 ENCOUNTER — Ambulatory Visit (INDEPENDENT_AMBULATORY_CARE_PROVIDER_SITE_OTHER): Payer: Medicare PPO | Admitting: Nurse Practitioner

## 2022-09-22 VITALS — BP 122/64 | HR 87 | Temp 97.7°F | Ht 62.0 in | Wt 191.6 lb

## 2022-09-22 DIAGNOSIS — E1169 Type 2 diabetes mellitus with other specified complication: Secondary | ICD-10-CM

## 2022-09-22 DIAGNOSIS — K219 Gastro-esophageal reflux disease without esophagitis: Secondary | ICD-10-CM | POA: Diagnosis not present

## 2022-09-22 DIAGNOSIS — E785 Hyperlipidemia, unspecified: Secondary | ICD-10-CM | POA: Diagnosis not present

## 2022-09-22 DIAGNOSIS — E1122 Type 2 diabetes mellitus with diabetic chronic kidney disease: Secondary | ICD-10-CM

## 2022-09-22 DIAGNOSIS — E559 Vitamin D deficiency, unspecified: Secondary | ICD-10-CM | POA: Diagnosis not present

## 2022-09-22 DIAGNOSIS — I10 Essential (primary) hypertension: Secondary | ICD-10-CM | POA: Diagnosis not present

## 2022-09-22 DIAGNOSIS — Z79899 Other long term (current) drug therapy: Secondary | ICD-10-CM

## 2022-09-22 DIAGNOSIS — H6993 Unspecified Eustachian tube disorder, bilateral: Secondary | ICD-10-CM | POA: Diagnosis not present

## 2022-09-22 DIAGNOSIS — N182 Chronic kidney disease, stage 2 (mild): Secondary | ICD-10-CM | POA: Diagnosis not present

## 2022-09-22 NOTE — Patient Instructions (Signed)
Eustachian Tube Dysfunction  Eustachian tube dysfunction refers to a condition in which a blockage develops in the narrow passage that connects the middle ear to the back of the nose (eustachian tube). The eustachian tube regulates air pressure in the middle ear by letting air move between the ear and nose. It also helps to drain fluid from the middle ear space. Eustachian tube dysfunction can affect one or both ears. When the eustachian tube does not function properly, air pressure, fluid, or both can build up in the middle ear. What are the causes? This condition occurs when the eustachian tube becomes blocked or cannot open normally. Common causes of this condition include: Ear infections. Colds and other infections that affect the nose, mouth, and throat (upper respiratory tract). Allergies. Irritation from cigarette smoke. Irritation from stomach acid coming up into the esophagus (gastroesophageal reflux). The esophagus is the part of the body that moves food from the mouth to the stomach. Sudden changes in air pressure, such as from descending in an airplane or scuba diving. Abnormal growths in the nose or throat, such as: Growths that line the nose (nasal polyps). Abnormal growth of cells (tumors). Enlarged tissue at the back of the throat (adenoids). What increases the risk? You are more likely to develop this condition if: You smoke. You are overweight. You are a child who has: Certain birth defects of the mouth, such as cleft palate. Large tonsils or adenoids. What are the signs or symptoms? Common symptoms of this condition include: A feeling of fullness in the ear. Ear pain. Clicking or popping noises in the ear. Ringing in the ear (tinnitus). Hearing loss. Loss of balance. Dizziness. Symptoms may get worse when the air pressure around you changes, such as when you travel to an area of high elevation, fly on an airplane, or go scuba diving. How is this diagnosed? This  condition may be diagnosed based on: Your symptoms. A physical exam of your ears, nose, and throat. Tests, such as those that measure: The movement of your eardrum. Your hearing (audiometry). How is this treated? Treatment depends on the cause and severity of your condition. In mild cases, you may relieve your symptoms by moving air into your ears. This is called "popping the ears." In more severe cases, or if you have symptoms of fluid in your ears, treatment may include: Medicines to relieve congestion (decongestants). Medicines that treat allergies (antihistamines). Nasal sprays or ear drops that contain medicines that reduce swelling (steroids). A procedure to drain the fluid in your eardrum. In this procedure, a small tube may be placed in the eardrum to: Drain the fluid. Restore the air in the middle ear space. A procedure to insert a balloon device through the nose to inflate the opening of the eustachian tube (balloon dilation). Follow these instructions at home: Lifestyle Do not do any of the following until your health care provider approves: Travel to high altitudes. Fly in airplanes. Work in a pressurized cabin or room. Scuba dive. Do not use any products that contain nicotine or tobacco. These products include cigarettes, chewing tobacco, and vaping devices, such as e-cigarettes. If you need help quitting, ask your health care provider. Keep your ears dry. Wear fitted earplugs during showering and bathing. Dry your ears completely after. General instructions Take over-the-counter and prescription medicines only as told by your health care provider. Use techniques to help pop your ears as recommended by your health care provider. These may include: Chewing gum. Yawning. Frequent, forceful swallowing.   Closing your mouth, holding your nose closed, and gently blowing as if you are trying to blow air out of your nose. Keep all follow-up visits. This is important. Contact a  health care provider if: Your symptoms do not go away after treatment. Your symptoms come back after treatment. You are unable to pop your ears. You have: A fever. Pain in your ear. Pain in your head or neck. Fluid draining from your ear. Your hearing suddenly changes. You become very dizzy. You lose your balance. Get help right away if: You have a sudden, severe increase in any of your symptoms. Summary Eustachian tube dysfunction refers to a condition in which a blockage develops in the eustachian tube. It can be caused by ear infections, allergies, inhaled irritants, or abnormal growths in the nose or throat. Symptoms may include ear pain or fullness, hearing loss, or ringing in the ears. Mild cases are treated with techniques to unblock the ears, such as yawning or chewing gum. More severe cases are treated with medicines or procedures. This information is not intended to replace advice given to you by your health care provider. Make sure you discuss any questions you have with your health care provider. Document Revised: 10/12/2020 Document Reviewed: 10/12/2020 Elsevier Patient Education  2023 [employer] Inc.  

## 2022-09-23 LAB — LIPID PANEL
Cholesterol: 142 mg/dL (ref <200)
HDL: 51 mg/dL (ref >=50)
LDL Cholesterol (Calc): 63 mg/dL (calc)
Non-HDL Cholesterol (Calc): 91 mg/dL (calc) (ref <130)
Total CHOL/HDL Ratio: 2.8 (calc) (ref <5.0)
Triglycerides: 211 mg/dL — ABNORMAL HIGH (ref <150)

## 2022-09-23 LAB — COMPLETE METABOLIC PANEL WITH GFR
AG Ratio: 1.8 (calc) (ref 1.0–2.5)
ALT: 23 U/L (ref 6–29)
AST: 21 U/L (ref 10–35)
Albumin: 4.4 g/dL (ref 3.6–5.1)
Alkaline phosphatase (APISO): 84 U/L (ref 37–153)
BUN: 13 mg/dL (ref 7–25)
CO2: 24 mmol/L (ref 20–32)
Calcium: 10 mg/dL (ref 8.6–10.4)
Chloride: 104 mmol/L (ref 98–110)
Creat: 0.71 mg/dL (ref 0.60–1.00)
Globulin: 2.4 g/dL (calc) (ref 1.9–3.7)
Glucose, Bld: 106 mg/dL — ABNORMAL HIGH (ref 65–99)
Potassium: 4.4 mmol/L (ref 3.5–5.3)
Sodium: 142 mmol/L (ref 135–146)
Total Bilirubin: 0.8 mg/dL (ref 0.2–1.2)
Total Protein: 6.8 g/dL (ref 6.1–8.1)
eGFR: 90 mL/min/{1.73_m2} (ref >=60)

## 2022-09-23 LAB — CBC WITH DIFFERENTIAL/PLATELET
Absolute Monocytes: 537 cells/uL (ref 200–950)
Basophils Absolute: 87 cells/uL (ref 0–200)
Basophils Relative: 1.1 %
Eosinophils Absolute: 411 cells/uL (ref 15–500)
Eosinophils Relative: 5.2 %
HCT: 42.5 % (ref 35.0–45.0)
Hemoglobin: 14.2 g/dL (ref 11.7–15.5)
Lymphs Abs: 1596 cells/uL (ref 850–3900)
MCH: 28.7 pg (ref 27.0–33.0)
MCHC: 33.4 g/dL (ref 32.0–36.0)
MCV: 86 fL (ref 80.0–100.0)
MPV: 11.4 fL (ref 7.5–12.5)
Monocytes Relative: 6.8 %
Neutro Abs: 5269 cells/uL (ref 1500–7800)
Neutrophils Relative %: 66.7 %
Platelets: 290 10*3/uL (ref 140–400)
RBC: 4.94 10*6/uL (ref 3.80–5.10)
RDW: 13.1 % (ref 11.0–15.0)
Total Lymphocyte: 20.2 %
WBC: 7.9 10*3/uL (ref 3.8–10.8)

## 2022-09-23 LAB — HEMOGLOBIN A1C
Hgb A1c MFr Bld: 5.8 % of total Hgb — ABNORMAL HIGH (ref <5.7)
Mean Plasma Glucose: 120 mg/dL
eAG (mmol/L): 6.6 mmol/L

## 2022-09-26 ENCOUNTER — Encounter: Payer: Self-pay | Admitting: Internal Medicine

## 2022-09-26 ENCOUNTER — Ambulatory Visit (INDEPENDENT_AMBULATORY_CARE_PROVIDER_SITE_OTHER): Payer: Medicare PPO | Admitting: Internal Medicine

## 2022-09-26 VITALS — BP 112/68 | HR 89 | Temp 97.5°F | Resp 16 | Ht 62.0 in | Wt 192.4 lb

## 2022-09-26 DIAGNOSIS — J069 Acute upper respiratory infection, unspecified: Secondary | ICD-10-CM | POA: Diagnosis not present

## 2022-09-26 DIAGNOSIS — H6506 Acute serous otitis media, recurrent, bilateral: Secondary | ICD-10-CM

## 2022-09-26 MED ORDER — PREDNISONE 10 MG PO TABS: ORAL_TABLET | ORAL | 10 refills | Status: DC

## 2022-09-26 MED ORDER — PSEUDOEPHEDRINE HCL ER 120 MG PO TB12: ORAL_TABLET | ORAL | 2 refills | Status: DC

## 2022-09-26 NOTE — Progress Notes (Addendum)
Future Appointments  Date Time Provider Department  12/28/2022  2:00 PM [name], MD [facility]  04/03/2023  9:30 AM Darrol Jump, NP [facility]    History of Present Illness:                 This very nice 72 y.o.  RH DWF with  HTN, HLD, T2_NIDDM, GERD and Vitamin D Deficiency presents with c/o head & chest congestion & sensation of fullness in  her ears. Denies fever, chills, sweats, sputum or dyspnea.       Patient also reports that she has upcoming scheduled Rt shoulder surgery by Dr Onnie Graham.  Recent labs at  09/22/2022 OV all OK - Patient is considered low risk for scheduled surgery. Patient advised to [facility] for 1 week before surgery  & may resume the day following surgery.     Current Outpatient Medications on File Prior to Visit  Medication Sig   VITAMIN C 1000 MG tablet Take daily.   aspirin EC 81 MG tablet Take daily.   bisoprolol-hctz  10-6.25 MG tablet TAKE 1 TABLET  DAILY    VITAMIN D 5000 UNITS  Take 2 tablets every morning.   losartan  50 MG tablet Take  1 tablet Daily    metFORMIN -X 500 MG 24 hr tablet TAKE 2 TABLETS TWICE DAILY WITH MEALS   MOUNJARO 2.5 MG/0.5ML Pen ADMINISTER 2.5 MG UNDER THE SKIN EVERY 7 DAYS FOR DIABETES   Multiple Vitamin  tablet Take 1 tablet daily.   rosuvastatin  20 MG tablet TAKE 1 TABLET  DAILY  3 x /wk (M,W,F)     Allergies  Allergen Reactions   Dexamethasone Hives and Other (See Comments)     Problem list She has Essential hypertension; Hemorrhoids; DJD; Stress-induced cardiomyopathy; T2DM (type 2 diabetes mellitus) ([place]); Hyperlipidemia associated with type 2 diabetes mellitus ([place]); Vitamin D deficiency; Medication management; Osteoarthritis of left knee; Morbid obesity ([place])) ; Takotsubo syndrome; Acid reflux; and CKD stage 2 due to type 2 diabetes mellitus (HCC) on their problem list.   Observations/Objective:  BP 112/68   Pulse 89   Temp (!) 97.5 F (36.4 C)   Resp 16   Ht 5' 2"$  (1.575 m)   Wt 192 lb  6.4 oz (87.3 kg)   SpO2 96%   BMI 35.19 kg/m   Dry cough . No Stridor.   HEENT- EACs  - Nl . TM's retracted /Nl color.    N/O/P - clear .  Neck - supple.  Chest - Clear equal BS. Cor - Nl HS. RRR w/o sig MGR. PP 1(+). No edema. MS- Decreased ROM Rt shoulder   w/o deformities.  Gait Nl. Neuro -  Nl w/o focal abnormalities.   Assessment and Plan:   1. Viral upper respiratory tract infection  - pseudoephedrine 120 MG 12 hr tablet;  Take   1 tablet    2 x /day  Dispense: 20 tablet; Refill: 2  - predniSONE 10 MG tablet;  1 tab 3 x day for 3 days, then 1 tab 2 x day for 3 days, then 1 tab Daily   Dispense    2. Recurrent acute serous otitis media of both ears  - pseudoephedrine 120 MG 12 hr tablet;  Take   1 tablet    2 x /day  Dispense: 20 tablet; Refill: 2  - predniSONE 10 MG tablet;  1 tab 3 x day for 3 days, then 1 tab 2 x day for 3  days, then 1 tab Daily   Dispense: 20 tablet; Refill: 10     Follow Up Instructions:        - Patient is considered low risk for scheduled surgery. Patient advised to hold [facility] for 1 week before surgery  & may resume the day following surgery.       I discussed the assessment and treatment plan with the patient. The patient was provided an opportunity to ask questions and all were answered. The patient agreed with the plan and demonstrated an understanding of the instructions.       The patient was advised to call back or seek an in-person evaluation if the symptoms worsen or if the condition fails to improve as anticipated.    Kirtland Bouchard, MD

## 2022-10-13 ENCOUNTER — Encounter: Payer: Self-pay | Admitting: Internal Medicine

## 2022-10-26 ENCOUNTER — Other Ambulatory Visit: Payer: Self-pay | Admitting: Internal Medicine

## 2022-10-28 DIAGNOSIS — E785 Hyperlipidemia, unspecified: Secondary | ICD-10-CM | POA: Diagnosis not present

## 2022-10-28 DIAGNOSIS — G8918 Other acute postprocedural pain: Secondary | ICD-10-CM | POA: Diagnosis not present

## 2022-10-28 DIAGNOSIS — M19011 Primary osteoarthritis, right shoulder: Secondary | ICD-10-CM | POA: Diagnosis not present

## 2022-10-28 DIAGNOSIS — Z471 Aftercare following joint replacement surgery: Secondary | ICD-10-CM | POA: Diagnosis not present

## 2022-10-28 DIAGNOSIS — M25511 Pain in right shoulder: Secondary | ICD-10-CM | POA: Diagnosis not present

## 2022-10-28 DIAGNOSIS — M75121 Complete rotator cuff tear or rupture of right shoulder, not specified as traumatic: Secondary | ICD-10-CM | POA: Diagnosis not present

## 2022-10-28 DIAGNOSIS — Z96611 Presence of right artificial shoulder joint: Secondary | ICD-10-CM | POA: Diagnosis not present

## 2022-10-28 DIAGNOSIS — I890 Lymphedema, not elsewhere classified: Secondary | ICD-10-CM | POA: Diagnosis not present

## 2022-10-28 DIAGNOSIS — Z6835 Body mass index (BMI) 35.0-35.9, adult: Secondary | ICD-10-CM | POA: Diagnosis not present

## 2022-11-07 DIAGNOSIS — Z96611 Presence of right artificial shoulder joint: Secondary | ICD-10-CM | POA: Diagnosis not present

## 2022-11-07 DIAGNOSIS — Z471 Aftercare following joint replacement surgery: Secondary | ICD-10-CM | POA: Diagnosis not present

## 2022-11-15 ENCOUNTER — Other Ambulatory Visit: Payer: Self-pay | Admitting: Nurse Practitioner

## 2022-11-15 DIAGNOSIS — H524 Presbyopia: Secondary | ICD-10-CM | POA: Diagnosis not present

## 2022-11-15 DIAGNOSIS — H26491 Other secondary cataract, right eye: Secondary | ICD-10-CM | POA: Diagnosis not present

## 2022-11-15 DIAGNOSIS — Z961 Presence of intraocular lens: Secondary | ICD-10-CM | POA: Diagnosis not present

## 2022-11-15 DIAGNOSIS — E119 Type 2 diabetes mellitus without complications: Secondary | ICD-10-CM | POA: Diagnosis not present

## 2022-11-15 LAB — HM DIABETES EYE EXAM

## 2022-11-15 MED ORDER — MOUNJARO 2.5 MG/0.5ML ~~LOC~~ SOAJ: 2.5000 mg | SUBCUTANEOUS | 0 refills | Status: DC

## 2022-11-29 DIAGNOSIS — M25511 Pain in right shoulder: Secondary | ICD-10-CM | POA: Diagnosis not present

## 2022-12-05 ENCOUNTER — Encounter: Payer: Medicare PPO | Admitting: Internal Medicine

## 2022-12-15 DIAGNOSIS — M25511 Pain in right shoulder: Secondary | ICD-10-CM | POA: Diagnosis not present

## 2022-12-16 ENCOUNTER — Other Ambulatory Visit: Payer: Self-pay | Admitting: Nurse Practitioner

## 2022-12-20 DIAGNOSIS — M25511 Pain in right shoulder: Secondary | ICD-10-CM | POA: Diagnosis not present

## 2022-12-22 DIAGNOSIS — M25511 Pain in right shoulder: Secondary | ICD-10-CM | POA: Diagnosis not present

## 2022-12-27 DIAGNOSIS — M25511 Pain in right shoulder: Secondary | ICD-10-CM | POA: Diagnosis not present

## 2022-12-27 NOTE — Progress Notes (Unsigned)
Complete Physical Exam  Assessment:   Diagnoses and all orders for this visit:  Encounter for General Adult medical examination with abnormal findings Due Annually  Takotsubo syndrome Active Surveillance No symptoms for over 5 years Has been released by cardiology  Stress-induced cardiomyopathy  Active Surveillance Has been released by cardiology; monitor   Hemorrhoids, unspecified hemorrhoid type Asymptomatic at this time; use anusol PRN Keep BM soft. Stay well hydrated  Essential hypertension Controlled  Continue medications;  Discussed DASH (Dietary Approaches to Stop Hypertension) DASH diet is lower in sodium than a typical American diet. Cut back on foods that are high in saturated fat, cholesterol, and trans fats. Eat more whole-grain foods, fish, poultry, and nuts Remain active and exercise as tolerated daily.  Monitor BP at home-Call if greater than 130/80.    Type 2 diabetes mellitus with stage 2 chronic kidney disease, without long-term current use of insulin [facility]) Education: Reviewed 'ABCs' of diabetes management  A1C (<7):  Goal Met. Blood pressure (<130/80):  Goal Met Cholesterol (LDL <70):  Goal Met Continue Eye Exam yearly  Continue Dental Exam Q6 mo Discussed dietary recommendations Discussed Physical Activity recommendations Foot exam UTD   DJD Managed by OTC analgesics Followed by [facility] Dr. Darrelyn Hillock, Dr. Ranell Patrick Weight loss advised  Vitamin D deficiency Slightly elevated 11/2021. She has reduced dose. Check vitamin D level q 6 months or as needed  Morbid obesity (BMI 37)  Discussed appropriate BMI Goal of losing 1 lb per month. Diet modification. Physical activity. Encouraged/praised to build confidence.   Medication management All medications discussed and reviewed in full. All questions and concerns regarding medications addressed.     Gastroesophageal reflux disease, esophagitis presence not specified Well managed on  current medications No suspected reflux complications (Barret/stricture). Lifestyle modification:  wt loss, avoid meals 2-3h before bedtime. Consider eliminating food triggers:  chocolate, caffeine, EtOH, acid/spicy food.   Hyperlipidemia associated with type 2 diabetes mellitus (HCC) Controlled with slightly elevated Triglycerides. Suggest Omega 3 Supplement Continue medications;  Discussed lifestyle modifications. Recommended diet heavy in fruits and veggies, omega 3's. Decrease consumption of animal meats, cheeses, and dairy products. Remain active and exercise as tolerated. Continue to monitor.     Screening for Osteoporosis -DEXA Scan Ordered      Over 40 minutes of exam, counseling, chart review and critical decision making was performed Future Appointments  Date Time Provider Department Center  12/28/2022  3:00 PM Raynelle Dick, NP [facility] None  04/03/2023  9:30 AM Adela Glimpse, NP [facility] None  12/28/2023  3:00 PM Raynelle Dick, NP [facility] None       Subjective:  [name] is a 72 y.o. female who presents for Medicare Annual Wellness Visit and 3 month follow up.   Overall she reports doing well.  She has just retired two months ago.  She is enjoying spending time with her sister.  She has recently seen general surgery, [facility] on July 21st of 2023., Dr Luretha Murphy for evaluation of epidermal cyst located under the right breast.  She reports that. surgery is to be scheduled for removal.  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 Has been released by cardiology. We are monitoring  Established with [facility] as needed, takes aleve occasionally for R foot arthritis. She complains of increase in right sided back pain, most notably when  moving or walking. Reports a jolting and sharp shooting pain that radiates up and down  her back. She has been helping her sister clean out a friend's home, doing a lot of moving, bending, twisting. The pain has occurred for the past month.  she has a diagnosis of GERD which is currently doing well off of medications. She is no longer taking Omeprazole.  BMI is There is no height or weight on file to calculate BMI., she is still trying to be active. She is newly on phentermine/topamax, down 13 lb since last visit. She has not lost any weight in the last 3 mo. Wt Readings from Last 3 Encounters:  09/26/22 192 lb 6.4 oz (87.3 kg)  09/22/22 191 lb 9.6 oz (86.9 kg)  06/22/22 208 lb (94.3 kg)   She does not check BP at home, today their BP is    Well controlled on medications. She does not workout. She denies chest pain, shortness of breath, dizziness.   She is on cholesterol medication (rosuvastatin 20 mg daily) and denies myalgias. Her cholesterol is at goal less than 70. The cholesterol, however, elevated triglycerides at last visit was:   Lab Results  Component Value Date   CHOL 142 09/22/2022   HDL 51 09/22/2022   LDLCALC 63 09/22/2022   TRIG 211 (H) 09/22/2022   CHOLHDL 2.8 09/22/2022    She has not been working on diet and exercise for T2DM well controlled by metformin, foot ulcerations, increased appetite, nausea, paresthesia of the feet, polydipsia, polyuria, visual disturbances, vomiting and weight loss. She does have glucometer and suppliesbut hasn't been checking due to recent very well controlled values. Last A1C in the office was:  Lab Results  Component Value Date   HGBA1C 5.8 (H) 09/22/2022   Last GFR: Lab Results  Component Value Date   GFRNONAA >60 06/02/2021   Patient is on Vitamin D supplement and at goal at recent check:    Lab Results  Component Value Date   VD25OH 95 06/22/2022      Medication Review:  Current Outpatient Medications (Endocrine & Metabolic):    metFORMIN (GLUCOPHAGE-XR) 500 MG 24 hr tablet, TAKE 2 TABLETS BY MOUTH TWICE DAILY  WITH MEALS FOR DIABETES   MOUNJARO 2.5 MG/0.5ML Pen, ADMINISTER 2.5 MG UNDER THE SKIN 1 TIME A WEEK   predniSONE (DELTASONE) 10 MG tablet, 1 tab 3 x day for 3 days, then 1 tab 2 x day for 3 days, then 1 tab Daily  Current Outpatient Medications (Cardiovascular):    bisoprolol-hydrochlorothiazide (ZIAC) 10-6.25 MG tablet, TAKE 1 TABLET BY MOUTH DAILY FOR BLOOD PRESSURE   losartan (COZAAR) 50 MG tablet, TAKE 1 TABLET BY MOUTH DAILY FOR BLOOD PRESSURE   rosuvastatin (CRESTOR) 20 MG tablet, TAKE 1 TABLET BY MOUTH DAILY FOR CHOLESTEROL (Patient taking differently: TAKE 1 TABLET BY MOUTH DAILY FOR CHOLESTEROL M,W,F)  Current Outpatient Medications (Respiratory):    pseudoephedrine (SUDAFED) 120 MG 12 hr tablet, Take   1 tablet    2 x /day (every 12 hours)    for Sinus & Chest Congestion  Current Outpatient Medications (Analgesics):    aspirin EC 81 MG tablet, Take 81 mg by mouth daily.   Current Outpatient Medications (Other):    Ascorbic Acid (VITAMIN C) 1000 MG tablet, Take 1,000 mg by mouth daily.   Cholecalciferol (VITAMIN D-3) 5000 UNITS TABS, Take 2 tablets by mouth every morning.   Multiple Vitamin (MULTIVITAMIN) tablet, Take 1 tablet by mouth daily.  Allergies  Allergen  Reactions   Dexamethasone Hives and Other (See Comments)    Current Problems (verified) Patient Active Problem List   Diagnosis Date Noted   CKD stage 2 due to type 2 diabetes mellitus (HCC) 01/02/2019   Acid reflux 12/11/2017   Morbid obesity (HCC))  06/02/2014   Osteoarthritis of left knee 02/25/2014   Hyperlipidemia associated with type 2 diabetes mellitus (HCC) 12/08/2013   Vitamin D deficiency 12/08/2013   Medication management 12/08/2013   T2DM (type 2 diabetes mellitus) (HCC) 09/13/2013   Takotsubo syndrome 07/16/2011   Stress-induced cardiomyopathy 11/18/2010   Essential hypertension 10/29/2007   Hemorrhoids 10/29/2007   DJD 10/29/2007    Screening Tests Immunization History  Administered  Date(s) Administered   Influenza, High Dose Seasonal PF 04/30/2015, 05/23/2017, 05/16/2020, 04/15/2022   Influenza-Unspecified 04/15/2013, 05/06/2014, 04/29/2019, 04/25/2021   PFIZER(Purple Top)SARS-COV-2 Vaccination 10/19/2019, 11/09/2019, 05/16/2020   Pneumococcal Conjugate-13 11/01/2016   Pneumococcal Polysaccharide-23 04/30/2015   Respiratory Syncytial Virus Vaccine,Recomb Aduvanted(Arexvy) 04/15/2022   Tdap 03/11/2014   Zoster Recombinat (Shingrix) 06/28/2021   Zoster, Live 05/23/2012    Health Maintenance  Topic Date Due   Zoster Vaccines- Shingrix (2 of 2) 08/23/2021   FOOT EXAM  11/16/2021   COVID-19 Vaccine (4 - 2023-24 season) 04/15/2022   OPHTHALMOLOGY EXAM  11/10/2022   Diabetic kidney evaluation - Urine ACR  11/30/2022   Medicare Annual Wellness (AWV)  03/09/2023   INFLUENZA VACCINE  03/16/2023   HEMOGLOBIN A1C  03/23/2023   MAMMOGRAM  08/23/2023   Diabetic kidney evaluation - eGFR measurement  09/23/2023   DTaP/Tdap/Td (2 - Td or Tdap) 03/11/2024   DEXA SCAN  09/15/2024   COLONOSCOPY (Pts 45-72yrs Insurance coverage will need to be confirmed)  01/22/2025   Pneumonia Vaccine 6+ Years old  Completed   Hepatitis C Screening  Completed   HPV VACCINES  Aged Out     Names of Other Physician/Practitioners you currently use:  [facility] here for primary care  Dr. Emily Filbert, 10/2020, no retinopathy abstracted   Dr. Kimber Relic, dentist, last visit 2022, goes q70m  Patient Care Team: Lucky Cowboy, MD as PCP - General (Internal Medicine) Manning Charity, OD as Referring Physician (Optometry) Francena Hanly, MD as Consulting Physician (Orthopedic Surgery)  SURGICAL HISTORY She  has a past surgical history that includes Cesarean section (x3   last one 10/ 1988); Knee arthroscopy with medial menisectomy (Left, 02/25/2014); Cholecystectomy open (1979); Cataract extraction w/ intraocular lens  implant, bilateral (07/2009); Toe Surgery (1990);  Cardiac catheterization (11/25/2004  dr Riley Kill); Cardiac catheterization (10/10/2002   dr Chales Abrahams); Dilatation & curettage/hysteroscopy with myosure (N/A, 10/05/2018); and Mass excision (Right, 04/15/2022). FAMILY HISTORY Her family history includes Arthritis in her sister; COPD in her mother; Colon cancer in her maternal grandfather; Luiz Blare' disease in her sister; Heart attack in her father; Heart disease in her father; Hypertension in her mother and sister; Thyroid disease in her mother and sister. SOCIAL HISTORY She  reports that she has never smoked. She has never used smokeless tobacco. She reports current alcohol use. She reports that she does not use drugs.    Review of Systems  Constitutional:  Negative for malaise/fatigue and weight loss.  HENT:  Negative for hearing loss and tinnitus.   Eyes:  Negative for blurred vision and double vision.  Respiratory:  Negative for cough, sputum production, shortness of breath and wheezing.   Cardiovascular:  Negative for chest pain, palpitations, orthopnea, claudication, leg swelling and PND.  Gastrointestinal:  Negative for abdominal pain,  blood in stool, constipation, diarrhea, heartburn, melena, nausea and vomiting.  Genitourinary: Negative.   Musculoskeletal:  Positive for back pain (reports right side back pain). Negative for falls, joint pain and myalgias.  Skin:  Negative for rash.  Neurological:  Negative for dizziness, tingling, sensory change, weakness and headaches.  Endo/Heme/Allergies:  Negative for polydipsia.  Psychiatric/Behavioral: Negative.  Negative for depression, memory loss, substance abuse and suicidal ideas. The patient is not nervous/anxious and does not have insomnia.   All other systems reviewed and are negative.    Objective:     There were no vitals filed for this visit.  There is no height or weight on file to calculate BMI.  General appearance: alert, no distress, WD/WN, female HEENT: normocephalic, sclerae  anicteric, TMs pearly, nares patent, no discharge or erythema, pharynx normal Oral cavity: MMM, no lesions Neck: supple, no lymphadenopathy, no thyromegaly, no masses Heart: RRR, normal S1, S2, no murmurs Lungs: CTA bilaterally, no wheezes, rhonchi, or rales Abdomen: +bs, soft, non tender, non distended, no masses, no hepatomegaly, no splenomegaly Musculoskeletal: Right tide pain with moving.  LROM d/t pain.  Tender to palpation right mid thoracic.  No swelling, no obvious deformity Extremities: no edema, no cyanosis, no clubbing Pulses: 2+ symmetric, upper and lower extremities, normal cap refill Neurological: alert, oriented x 3, CN2-12 intact, strength normal upper extremities and lower extremities, sensation normal throughout, DTRs 2+ throughout, no cerebellar signs, gait normal Psychiatric: normal affect, behavior normal, pleasant      Raynelle Dick, NP   12/27/2022

## 2022-12-28 ENCOUNTER — Ambulatory Visit (INDEPENDENT_AMBULATORY_CARE_PROVIDER_SITE_OTHER): Payer: Medicare PPO | Admitting: Nurse Practitioner

## 2022-12-28 ENCOUNTER — Encounter: Payer: Medicare PPO | Admitting: Internal Medicine

## 2022-12-28 ENCOUNTER — Encounter: Payer: Self-pay | Admitting: Nurse Practitioner

## 2022-12-28 VITALS — BP 130/70 | HR 63 | Temp 97.9°F | Resp 16 | Ht 62.0 in | Wt 192.6 lb

## 2022-12-28 DIAGNOSIS — K219 Gastro-esophageal reflux disease without esophagitis: Secondary | ICD-10-CM | POA: Diagnosis not present

## 2022-12-28 DIAGNOSIS — Z79899 Other long term (current) drug therapy: Secondary | ICD-10-CM

## 2022-12-28 DIAGNOSIS — E1122 Type 2 diabetes mellitus with diabetic chronic kidney disease: Secondary | ICD-10-CM

## 2022-12-28 DIAGNOSIS — Z96611 Presence of right artificial shoulder joint: Secondary | ICD-10-CM

## 2022-12-28 DIAGNOSIS — I700 Atherosclerosis of aorta: Secondary | ICD-10-CM | POA: Diagnosis not present

## 2022-12-28 DIAGNOSIS — I5181 Takotsubo syndrome: Secondary | ICD-10-CM

## 2022-12-28 DIAGNOSIS — E785 Hyperlipidemia, unspecified: Secondary | ICD-10-CM | POA: Diagnosis not present

## 2022-12-28 DIAGNOSIS — Z0000 Encounter for general adult medical examination without abnormal findings: Secondary | ICD-10-CM | POA: Diagnosis not present

## 2022-12-28 DIAGNOSIS — Z1389 Encounter for screening for other disorder: Secondary | ICD-10-CM | POA: Diagnosis not present

## 2022-12-28 DIAGNOSIS — Z1329 Encounter for screening for other suspected endocrine disorder: Secondary | ICD-10-CM | POA: Diagnosis not present

## 2022-12-28 DIAGNOSIS — M159 Polyosteoarthritis, unspecified: Secondary | ICD-10-CM

## 2022-12-28 DIAGNOSIS — E559 Vitamin D deficiency, unspecified: Secondary | ICD-10-CM | POA: Diagnosis not present

## 2022-12-28 DIAGNOSIS — N182 Chronic kidney disease, stage 2 (mild): Secondary | ICD-10-CM | POA: Diagnosis not present

## 2022-12-28 DIAGNOSIS — E782 Mixed hyperlipidemia: Secondary | ICD-10-CM

## 2022-12-28 DIAGNOSIS — E1169 Type 2 diabetes mellitus with other specified complication: Secondary | ICD-10-CM | POA: Diagnosis not present

## 2022-12-28 DIAGNOSIS — I10 Essential (primary) hypertension: Secondary | ICD-10-CM

## 2022-12-28 DIAGNOSIS — Z136 Encounter for screening for cardiovascular disorders: Secondary | ICD-10-CM | POA: Diagnosis not present

## 2022-12-28 DIAGNOSIS — Z0001 Encounter for general adult medical examination with abnormal findings: Secondary | ICD-10-CM

## 2022-12-28 LAB — CBC WITH DIFFERENTIAL/PLATELET
Absolute Monocytes: 421 cells/uL (ref 200–950)
HCT: 42.6 % (ref 35.0–45.0)
Hemoglobin: 14.1 g/dL (ref 11.7–15.5)
Lymphs Abs: 1932 cells/uL (ref 850–3900)
MPV: 12.2 fL (ref 7.5–12.5)
Neutrophils Relative %: 61.1 %
RDW: 12.9 % (ref 11.0–15.0)
WBC: 6.9 10*3/uL (ref 3.8–10.8)

## 2022-12-28 MED ORDER — ROSUVASTATIN CALCIUM 20 MG PO TABS
ORAL_TABLET | ORAL | 3 refills | Status: DC
Start: 2022-12-28 — End: 2023-10-23

## 2022-12-28 MED ORDER — MOUNJARO 5 MG/0.5ML ~~LOC~~ SOAJ
5.0000 mg | SUBCUTANEOUS | 2 refills | Status: DC
Start: 2022-12-28 — End: 2023-04-20

## 2022-12-28 NOTE — Patient Instructions (Signed)

## 2022-12-29 DIAGNOSIS — M25511 Pain in right shoulder: Secondary | ICD-10-CM | POA: Diagnosis not present

## 2022-12-29 LAB — MICROALBUMIN / CREATININE URINE RATIO
Creatinine, Urine: 10 mg/dL — ABNORMAL LOW (ref 20–275)
Microalb, Ur: <0.2 mg/dL

## 2022-12-29 LAB — LIPID PANEL
Cholesterol: 152 mg/dL (ref <200)
HDL: 54 mg/dL (ref >=50)
LDL Cholesterol (Calc): 74 mg/dL (calc)
Non-HDL Cholesterol (Calc): 98 mg/dL (calc) (ref <130)
Total CHOL/HDL Ratio: 2.8 (calc) (ref <5.0)
Triglycerides: 166 mg/dL — ABNORMAL HIGH (ref <150)

## 2022-12-29 LAB — URINALYSIS, ROUTINE W REFLEX MICROSCOPIC
Bilirubin Urine: NEGATIVE
Glucose, UA: NEGATIVE
Hgb urine dipstick: NEGATIVE
Ketones, ur: NEGATIVE
Leukocytes,Ua: NEGATIVE
Nitrite: NEGATIVE
Protein, ur: NEGATIVE
Specific Gravity, Urine: 1.002 (ref 1.001–1.035)
pH: 6.5 (ref 5.0–8.0)

## 2022-12-29 LAB — COMPLETE METABOLIC PANEL WITH GFR
AG Ratio: 1.8 (calc) (ref 1.0–2.5)
ALT: 17 U/L (ref 6–29)
AST: 16 U/L (ref 10–35)
Albumin: 4.6 g/dL (ref 3.6–5.1)
Alkaline phosphatase (APISO): 93 U/L (ref 37–153)
BUN: 13 mg/dL (ref 7–25)
CO2: 27 mmol/L (ref 20–32)
Calcium: 10.3 mg/dL (ref 8.6–10.4)
Chloride: 102 mmol/L (ref 98–110)
Creat: 0.61 mg/dL (ref 0.60–1.00)
Globulin: 2.5 g/dL (calc) (ref 1.9–3.7)
Glucose, Bld: 96 mg/dL (ref 65–99)
Potassium: 4.4 mmol/L (ref 3.5–5.3)
Sodium: 141 mmol/L (ref 135–146)
Total Bilirubin: 0.7 mg/dL (ref 0.2–1.2)
Total Protein: 7.1 g/dL (ref 6.1–8.1)
eGFR: 95 mL/min/{1.73_m2} (ref >=60)

## 2022-12-29 LAB — CBC WITH DIFFERENTIAL/PLATELET
Basophils Absolute: 48 cells/uL (ref 0–200)
Basophils Relative: 0.7 %
Eosinophils Absolute: 283 cells/uL (ref 15–500)
Eosinophils Relative: 4.1 %
MCH: 28.3 pg (ref 27.0–33.0)
MCHC: 33.1 g/dL (ref 32.0–36.0)
MCV: 85.4 fL (ref 80.0–100.0)
Monocytes Relative: 6.1 %
Neutro Abs: 4216 cells/uL (ref 1500–7800)
Platelets: 261 10*3/uL (ref 140–400)
RBC: 4.99 10*6/uL (ref 3.80–5.10)
Total Lymphocyte: 28 %

## 2022-12-29 LAB — HEMOGLOBIN A1C
Hgb A1c MFr Bld: 5.5 % of total Hgb (ref <5.7)
Mean Plasma Glucose: 111 mg/dL
eAG (mmol/L): 6.2 mmol/L

## 2022-12-29 LAB — MAGNESIUM: Magnesium: 1.8 mg/dL (ref 1.5–2.5)

## 2022-12-29 LAB — TSH: TSH: 3.15 mIU/L (ref 0.40–4.50)

## 2022-12-29 LAB — VITAMIN D 25 HYDROXY (VIT D DEFICIENCY, FRACTURES): Vit D, 25-Hydroxy: 84 ng/mL (ref 30–100)

## 2023-01-03 DIAGNOSIS — M25511 Pain in right shoulder: Secondary | ICD-10-CM | POA: Diagnosis not present

## 2023-01-05 DIAGNOSIS — M25511 Pain in right shoulder: Secondary | ICD-10-CM | POA: Diagnosis not present

## 2023-01-10 DIAGNOSIS — M25511 Pain in right shoulder: Secondary | ICD-10-CM | POA: Diagnosis not present

## 2023-01-17 DIAGNOSIS — M25511 Pain in right shoulder: Secondary | ICD-10-CM | POA: Diagnosis not present

## 2023-01-18 ENCOUNTER — Encounter: Payer: Self-pay | Admitting: Internal Medicine

## 2023-01-25 DIAGNOSIS — Z471 Aftercare following joint replacement surgery: Secondary | ICD-10-CM | POA: Diagnosis not present

## 2023-01-25 DIAGNOSIS — Z96611 Presence of right artificial shoulder joint: Secondary | ICD-10-CM | POA: Diagnosis not present

## 2023-03-02 DIAGNOSIS — Z96611 Presence of right artificial shoulder joint: Secondary | ICD-10-CM | POA: Diagnosis not present

## 2023-03-08 ENCOUNTER — Ambulatory Visit: Payer: Medicare PPO | Admitting: Nurse Practitioner

## 2023-04-02 NOTE — Progress Notes (Unsigned)
MEDICARE ANNUAL WELLNESS VISIT AND FOLLOW UP  Assessment:   Diagnoses and all orders for this visit:  Encounter for Medicare annual wellness exam Due annually  Health maintenance reviewed Healthily lifestyle goals set  Takotsubo syndrome Active Surveillance No symptoms for over 5 years Has been released by cardiology  Stress-induced cardiomyopathy  Active Surveillance Has been released by cardiology; monitor   Hemorrhoids, unspecified hemorrhoid type Asymptomatic at this time; use anusol PRN Keep BM soft. Stay well hydrated  Essential hypertension Controlled  Continue medications;  Discussed DASH (Dietary Approaches to Stop Hypertension) DASH diet is lower in sodium than a typical American diet. Cut back on foods that are high in saturated fat, cholesterol, and trans fats. Eat more whole-grain foods, fish, poultry, and nuts Remain active and exercise as tolerated daily.  Monitor BP at home-Call if greater than 130/80.    Type 2 diabetes mellitus with stage 2 chronic kidney disease, without long-term current use of insulin [facility]) Education: Reviewed 'ABCs' of diabetes management  A1C (<7):  Goal Met. Blood pressure (<130/80):  Goal Met Cholesterol (LDL <70):  Goal Met Continue Eye Exam yearly  Continue Dental Exam Q6 mo Discussed dietary recommendations Discussed Physical Activity recommendations Foot exam UTD  Discussed how what you eat and drink can aide in kidney protection. Stay well hydrated. Avoid high salt foods. Avoid NSAIDS. Keep BP and BG well controlled.   Take medications as prescribed. Remain active and exercise as tolerated daily. Maintain weight.  Continue to monitor.   DJD Managed by OTC analgesics Followed by [facility] Dr. Darrelyn Hillock, Dr. Ranell Patrick Weight loss advised  Vitamin D deficiency Slightly elevated 11/2021. She has reduced dose. Check vitamin D level q 6 months or as needed  Morbid obesity (BMI 37)  Discussed appropriate  BMI Goal of losing 1 lb per month. Diet modification. Physical activity. Encouraged/praised to build confidence.   Medication management All medications discussed and reviewed in full. All questions and concerns regarding medications addressed.     Gastroesophageal reflux disease, esophagitis presence not specified Well managed on current medications No suspected reflux complications (Barret/stricture). Lifestyle modification:  wt loss, avoid meals 2-3h before bedtime. Consider eliminating food triggers:  chocolate, caffeine, EtOH, acid/spicy food.   Hyperlipidemia associated with type 2 diabetes mellitus (HCC) Controlled with slightly elevated Triglycerides. Suggest Omega 3 Supplement Continue medications;  Discussed lifestyle modifications. Recommended diet heavy in fruits and veggies, omega 3's. Decrease consumption of animal meats, cheeses, and dairy products. Remain active and exercise as tolerated. Continue to monitor.  Osteopenia Pursue a combination of weight-bearing exercises and strength training. Patients with severe mobility impairment should be referred for physical therapy. Advised on fall prevention measures including proper lighting in all rooms, removal of area rugs and floor clutter, use of walking devices as deemed appropriate, avoidance of uneven walking surfaces. Smoking cessation and moderate alcohol consumption if applicable Consume 800 to 1000 IU of vitamin D daily with a goal vitamin D serum value of 30 ng/mL or higher. Aim for 1000 to 1200 mg of elemental calcium daily through supplements and/or dietary sources.    Future Appointments  Date Time Provider Department Center  04/03/2023  9:30 AM Adela Glimpse, NP [facility] None  12/28/2023  3:00 PM Raynelle Dick, NP [facility] None  04/02/2024  9:00 AM Adela Glimpse, NP [facility] None     Plan:   During the course of the visit the patient was educated and counseled about appropriate  screening and preventive services including:   Pneumococcal  vaccine  Prevnar 13 Influenza vaccine Td vaccine Screening electrocardiogram Bone densitometry screening Colorectal cancer screening Diabetes screening Glaucoma screening Nutrition counseling  Advanced directives: requested   Subjective:  [name] is a 72 y.o. female who presents for Medicare Annual Wellness Visit and 3 month follow up.   Overall she reports doing well.  She has just retired two months ago.  She is enjoying spending time with her sister.  She has recently seen general surgery, [facility] on July 21st of 2023., Dr Luretha Murphy for evaluation of epidermal cyst located under the right breast.  She reports that. surgery is to be scheduled for removal.  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 Has been released by cardiology. We are monitoring  Established with [facility] as needed, takes aleve occasionally for R foot arthritis. She complains of increase in right sided back pain, most notably when moving or walking. Reports a jolting and sharp shooting pain that radiates up and down her back. She has been helping her sister clean out a friend's home, doing a lot of moving, bending, twisting. The pain has occurred for the past month.  she has a diagnosis of GERD which is currently doing well off of medications. She is no longer taking Omeprazole.  BMI is There is no height or weight on file to calculate BMI., she is still trying to be active. She is newly on phentermine/topamax, down 13 lb since last visit. She has not lost any weight in the last 3 mo. Wt Readings from Last 3 Encounters:  12/28/22 192 lb 9.6 oz (87.4 kg)  09/26/22 192 lb 6.4 oz (87.3 kg)  09/22/22 191 lb 9.6 oz (86.9 kg)   She does not check BP at home, today their BP is    Well controlled on medications. She does not  workout. She denies chest pain, shortness of breath, dizziness.   She is on cholesterol medication (rosuvastatin 20 mg daily) and denies myalgias. Her cholesterol is at goal less than 70. The cholesterol, however, elevated triglycerides at last visit was:   Lab Results  Component Value Date   CHOL 152 12/28/2022   HDL 54 12/28/2022   LDLCALC 74 12/28/2022   TRIG 166 (H) 12/28/2022   CHOLHDL 2.8 12/28/2022    She has not been working on diet and exercise for T2DM well controlled by metformin, foot ulcerations, increased appetite, nausea, paresthesia of the feet, polydipsia, polyuria, visual disturbances, vomiting and weight loss. She does have glucometer and suppliesbut hasn't been checking due to recent very well controlled values. Last A1C in the office was:  Lab Results  Component Value Date   HGBA1C 5.5 12/28/2022   Last GFR: Lab Results  Component Value Date   GFRNONAA >60 06/02/2021   Patient is on Vitamin D supplement and at goal at recent check:    Lab Results  Component Value Date   VD25OH 84 12/28/2022      Medication Review:  Current Outpatient Medications (Endocrine & Metabolic):    metFORMIN (GLUCOPHAGE-XR) 500 MG 24 hr tablet, TAKE 2 TABLETS BY MOUTH TWICE DAILY WITH MEALS FOR DIABETES   tirzepatide (MOUNJARO) 5 MG/0.5ML Pen, Inject 5 mg into the skin once a week.  Current Outpatient Medications (Cardiovascular):    bisoprolol-hydrochlorothiazide (ZIAC) 10-6.25 MG tablet, TAKE 1 TABLET BY MOUTH DAILY FOR BLOOD PRESSURE   losartan (  COZAAR) 50 MG tablet, TAKE 1 TABLET BY MOUTH DAILY FOR BLOOD PRESSURE   rosuvastatin (CRESTOR) 20 MG tablet, TAKE 1 TABLET BY MOUTH DAILY FOR CHOLESTEROL M,W,F   Current Outpatient Medications (Analgesics):    aspirin EC 81 MG tablet, Take 81 mg by mouth daily.   Current Outpatient Medications (Other):    Ascorbic Acid (VITAMIN C) 1000 MG tablet, Take 1,000 mg by mouth daily.   Cholecalciferol (VITAMIN D-3) 5000 UNITS TABS, Take 2  tablets by mouth every morning.   Multiple Vitamin (MULTIVITAMIN) tablet, Take 1 tablet by mouth daily.  Allergies  Allergen Reactions   Dexamethasone Hives and Other (See Comments)    Current Problems (verified) Patient Active Problem List   Diagnosis Date Noted   CKD stage 2 due to type 2 diabetes mellitus (HCC) 01/02/2019   Acid reflux 12/11/2017   Morbid obesity (HCC))  06/02/2014   Osteoarthritis of left knee 02/25/2014   Hyperlipidemia associated with type 2 diabetes mellitus (HCC) 12/08/2013   Vitamin D deficiency 12/08/2013   Medication management 12/08/2013   T2DM (type 2 diabetes mellitus) (HCC) 09/13/2013   Takotsubo syndrome 07/16/2011   Stress-induced cardiomyopathy 11/18/2010   Essential hypertension 10/29/2007   Hemorrhoids 10/29/2007   DJD 10/29/2007    Screening Tests Immunization History  Administered Date(s) Administered   Influenza, High Dose Seasonal PF 04/30/2015, 05/23/2017, 05/16/2020, 04/15/2022   Influenza-Unspecified 04/15/2013, 05/06/2014, 04/29/2019, 04/25/2021   PFIZER(Purple Top)SARS-COV-2 Vaccination 10/19/2019, 11/09/2019, 05/16/2020   Pneumococcal Conjugate-13 11/01/2016   Pneumococcal Polysaccharide-23 04/30/2015   Respiratory Syncytial Virus Vaccine,Recomb Aduvanted(Arexvy) 04/15/2022   Tdap 03/11/2014   Zoster Recombinant(Shingrix) 06/28/2021   Zoster, Live 05/23/2012    Preventative care: Last colonoscopy: 2016 due 2026 Pap: 06/2018 by Dr. Audie Box - endometrial polyp - had D&C, had follow up in 09/2018 - defers follow up  Mmg: 08/2022 DEXA: 09/2022 - Osteopenia   Tetanus: 2015 Influenza: 05/2021 Pneumonia: 2/2 Shingrix: check with insurance  Covid 19: 2/2, + booster   Names of Other Physician/Practitioners you currently use:  [facility] here for primary care  Dr. Emily Filbert, 10/2020, no retinopathy abstracted   Dr. Kimber Relic, dentist, last visit 2022, goes q62m  Patient Care Team: Lucky Cowboy, MD as PCP - General (Internal Medicine) Manning Charity, OD as Referring Physician (Optometry) Francena Hanly, MD as Consulting Physician (Orthopedic Surgery)  SURGICAL HISTORY She  has a past surgical history that includes Cesarean section (x3   last one 10/ 1988); Knee arthroscopy with medial menisectomy (Left, 02/25/2014); Cholecystectomy open (1979); Cataract extraction w/ intraocular lens  implant, bilateral (07/2009); Toe Surgery (1990); Cardiac catheterization (11/25/2004  dr Riley Kill); Cardiac catheterization (10/10/2002   dr Chales Abrahams); Dilatation & curettage/hysteroscopy with myosure (N/A, 10/05/2018); and Mass excision (Right, 04/15/2022). FAMILY HISTORY Her family history includes Arthritis in her sister; COPD in her mother; Colon cancer in her maternal grandfather; Luiz Blare' disease in her sister; Heart attack in her father; Heart disease in her father; Hypertension in her mother and sister; Thyroid disease in her mother and sister. SOCIAL HISTORY She  reports that she has never smoked. She has never used smokeless tobacco. She reports current alcohol use. She reports that she does not use drugs.   MEDICARE WELLNESS OBJECTIVES: Physical activity:   Cardiac risk factors:   Depression/mood screen:      03/08/2022   10:41 AM  Depression screen PHQ 2/9  Decreased Interest 0  Down, Depressed, Hopeless 0  PHQ - 2 Score 0    ADLs:  No data to display           Cognitive Testing  Alert? Yes  Normal Appearance?Yes  Oriented to person? Yes  Place? Yes   Time? Yes  Recall of three objects?  Yes  Can perform simple calculations? Yes  Displays appropriate judgment?Yes  Can read the correct time from a watch face?Yes  EOL planning:    Review of Systems  Constitutional:  Negative for malaise/fatigue and weight loss.  HENT:  Negative for hearing loss and tinnitus.   Eyes:  Negative for blurred vision and double vision.  Respiratory:  Negative for cough, sputum production,  shortness of breath and wheezing.   Cardiovascular:  Negative for chest pain, palpitations, orthopnea, claudication, leg swelling and PND.  Gastrointestinal:  Negative for abdominal pain, blood in stool, constipation, diarrhea, heartburn, melena, nausea and vomiting.  Genitourinary: Negative.   Musculoskeletal:  Positive for back pain (reports right side back pain). Negative for falls, joint pain and myalgias.  Skin:  Negative for rash.  Neurological:  Negative for dizziness, tingling, sensory change, weakness and headaches.  Endo/Heme/Allergies:  Negative for polydipsia.  Psychiatric/Behavioral: Negative.  Negative for depression, memory loss, substance abuse and suicidal ideas. The patient is not nervous/anxious and does not have insomnia.   All other systems reviewed and are negative.    Objective:     There were no vitals filed for this visit.  There is no height or weight on file to calculate BMI.  General appearance: alert, no distress, WD/WN, female HEENT: normocephalic, sclerae anicteric, TMs pearly, nares patent, no discharge or erythema, pharynx normal Oral cavity: MMM, no lesions Neck: supple, no lymphadenopathy, no thyromegaly, no masses Heart: RRR, normal S1, S2, no murmurs Lungs: CTA bilaterally, no wheezes, rhonchi, or rales Abdomen: +bs, soft, non tender, non distended, no masses, no hepatomegaly, no splenomegaly Musculoskeletal: Right tide pain with moving.  LROM d/t pain.  Tender to palpation right mid thoracic.  No swelling, no obvious deformity Extremities: no edema, no cyanosis, no clubbing Pulses: 2+ symmetric, upper and lower extremities, normal cap refill Neurological: alert, oriented x 3, CN2-12 intact, strength normal upper extremities and lower extremities, sensation normal throughout, DTRs 2+ throughout, no cerebellar signs, gait normal Psychiatric: normal affect, behavior normal, pleasant   Medicare Attestation I have personally reviewed: The patient's  medical and social history Their use of alcohol, tobacco or illicit drugs Their current medications and supplements The patient's functional ability including ADLs,fall risks, home safety risks, cognitive, and hearing and visual impairment Diet and physical activities Evidence for depression or mood disorders  The patient's weight, height, BMI, and visual acuity have been recorded in the chart.  I have made referrals, counseling, and provided education to the patient based on review of the above and I have provided the patient with a written personalized care plan for preventive services.     Adela Glimpse, NP   04/02/2023

## 2023-04-03 ENCOUNTER — Encounter: Payer: Self-pay | Admitting: Nurse Practitioner

## 2023-04-03 ENCOUNTER — Ambulatory Visit (INDEPENDENT_AMBULATORY_CARE_PROVIDER_SITE_OTHER): Payer: Medicare PPO | Admitting: Nurse Practitioner

## 2023-04-03 VITALS — BP 106/68 | HR 75 | Temp 97.3°F | Ht 62.0 in | Wt 189.8 lb

## 2023-04-03 DIAGNOSIS — N182 Chronic kidney disease, stage 2 (mild): Secondary | ICD-10-CM

## 2023-04-03 DIAGNOSIS — K649 Unspecified hemorrhoids: Secondary | ICD-10-CM | POA: Diagnosis not present

## 2023-04-03 DIAGNOSIS — E785 Hyperlipidemia, unspecified: Secondary | ICD-10-CM | POA: Diagnosis not present

## 2023-04-03 DIAGNOSIS — E1169 Type 2 diabetes mellitus with other specified complication: Secondary | ICD-10-CM | POA: Diagnosis not present

## 2023-04-03 DIAGNOSIS — Z79899 Other long term (current) drug therapy: Secondary | ICD-10-CM | POA: Diagnosis not present

## 2023-04-03 DIAGNOSIS — R6889 Other general symptoms and signs: Secondary | ICD-10-CM

## 2023-04-03 DIAGNOSIS — I10 Essential (primary) hypertension: Secondary | ICD-10-CM | POA: Diagnosis not present

## 2023-04-03 DIAGNOSIS — K219 Gastro-esophageal reflux disease without esophagitis: Secondary | ICD-10-CM

## 2023-04-03 DIAGNOSIS — E1122 Type 2 diabetes mellitus with diabetic chronic kidney disease: Secondary | ICD-10-CM | POA: Diagnosis not present

## 2023-04-03 DIAGNOSIS — E559 Vitamin D deficiency, unspecified: Secondary | ICD-10-CM | POA: Diagnosis not present

## 2023-04-03 DIAGNOSIS — I5181 Takotsubo syndrome: Secondary | ICD-10-CM

## 2023-04-03 DIAGNOSIS — M8580 Other specified disorders of bone density and structure, unspecified site: Secondary | ICD-10-CM

## 2023-04-03 DIAGNOSIS — M159 Polyosteoarthritis, unspecified: Secondary | ICD-10-CM

## 2023-04-03 DIAGNOSIS — Z0001 Encounter for general adult medical examination with abnormal findings: Secondary | ICD-10-CM | POA: Diagnosis not present

## 2023-04-03 DIAGNOSIS — Z0000 Encounter for general adult medical examination without abnormal findings: Secondary | ICD-10-CM

## 2023-04-03 NOTE — Patient Instructions (Signed)
Osteopenia  Osteopenia is a loss of thickness (density) inside the bones. Another name for osteopenia is low bone mass. Mild osteopenia is a normal part of aging. It is not a disease, and it does not cause symptoms. However, if you have osteopenia and continue to lose bone mass, you could develop a condition that causes the bones to become thin and break more easily (osteoporosis). Osteoporosis can cause you to lose some height, have back pain, and have a stooped posture. Although osteopenia is not a disease, making changes to your lifestyle and diet can help to prevent osteopenia from developing into osteoporosis. What are the causes? Osteopenia is caused by loss of calcium in the bones. Bones are constantly changing. Old bone cells are continually being replaced with new bone cells. This process builds new bone. The mineral calcium is needed to build new bone and maintain bone density. Bone density is usually highest around age 94. After that, most people's bodies cannot replace all the bone they have lost with new bone. What increases the risk? You are more likely to develop this condition if: You are older than age 53. You are a woman who went through menopause early. You have a long illness that keeps you in bed. You do not get enough exercise. You lack certain nutrients (malnutrition). You have an overactive thyroid gland (hyperthyroidism). You use products that contain nicotine or tobacco, such as cigarettes, e-cigarettes and chewing tobacco, or you drink a lot of alcohol. You are taking medicines that weaken the bones, such as steroids. What are the signs or symptoms? This condition does not cause any symptoms. You may have a slightly higher risk for bone breaks (fractures), so getting fractures more easily than normal may be an indication of osteopenia. How is this diagnosed? This condition may be diagnosed based on an X-ray exam that measures bone density (dual-energy X-ray  absorptiometry, or DEXA). This test can measure bone density in your hips, spine, and wrists. Osteopenia has no symptoms, so this condition is usually diagnosed after a routine bone density screening test is done for osteoporosis. This routine screening is usually done for: Women who are age 63 or older. Men who are age 41 or older. If you have risk factors for osteopenia, you may have the screening test at an earlier age. How is this treated? Making dietary and lifestyle changes can lower your risk for osteoporosis. If you have severe osteopenia that is close to becoming osteoporosis, this condition can be treated with medicines and dietary supplements such as calcium and vitamin D. These supplements help to rebuild bone density. Follow these instructions at home: Eating and drinking Eat a diet that is high in calcium and vitamin D. Calcium is found in dairy products, beans, salmon, and leafy green vegetables like spinach and broccoli. Look for foods that have vitamin D and calcium added to them (fortified foods), such as orange juice, cereal, and bread.  Lifestyle Do 30 minutes or more of a weight-bearing exercise every day, such as walking, jogging, or playing a sport. These types of exercises strengthen the bones. Do not use any products that contain nicotine or tobacco, such as cigarettes, e-cigarettes, and chewing tobacco. If you need help quitting, ask your health care provider. Do not drink alcohol if: Your health care provider tells you not to drink. You are pregnant, may be pregnant, or are planning to become pregnant. If you drink alcohol: Limit how much you use to: 0-1 drink a day for women. 0-2  drinks a day for men. Be aware of how much alcohol is in your drink. In the [place] one drink equals one 12 oz bottle of beer (355 mL), one 5 oz glass of wine (148 mL), or one 1 oz glass of hard liquor (44 mL). General instructions Take over-the-counter and prescription medicines only as  told by your health care provider. These include vitamins and supplements. Take precautions at home to lower your risk of falling, such as: Keeping rooms well-lit and free of clutter, such as cords. Installing safety rails on stairs. Using rubber mats in the bathroom or other areas that are often wet or slippery. Keep all follow-up visits. This is important. Contact a health care provider if: You have not had a bone density screening for osteoporosis and you are: A woman who is age 72 or older. A man who is age 15 or older. You are a postmenopausal woman who has not had a bone density screening for osteoporosis. You are older than age 37 and you want to know if you should have bone density screening for osteoporosis. Summary Osteopenia is a loss of thickness (density) inside the bones. Another name for osteopenia is low bone mass. Osteopenia is not a disease, but it may increase your risk for a condition that causes the bones to become thin and break more easily (osteoporosis). You may be at risk for osteopenia if you are older than age 56 or if you are a woman who went through early menopause. Osteopenia does not cause any symptoms, but it can be diagnosed with a bone density screening test. Dietary and lifestyle changes are the first treatment for osteopenia. These may lower your risk for osteoporosis. This information is not intended to replace advice given to you by your health care provider. Make sure you discuss any questions you have with your health care provider. Document Revised: 01/16/2020 Document Reviewed: 01/16/2020 Elsevier Patient Education  2024 [employer].

## 2023-04-04 LAB — CBC WITH DIFFERENTIAL/PLATELET
Absolute Monocytes: 421 {cells}/uL (ref 200–950)
Basophils Absolute: 69 {cells}/uL (ref 0–200)
Basophils Relative: 1 %
Eosinophils Absolute: 221 {cells}/uL (ref 15–500)
Eosinophils Relative: 3.2 %
HCT: 44.4 % (ref 35.0–45.0)
Hemoglobin: 14.7 g/dL (ref 11.7–15.5)
Lymphs Abs: 1725 cells/uL (ref 850–3900)
MCH: 28.8 pg (ref 27.0–33.0)
MCHC: 33.1 g/dL (ref 32.0–36.0)
MCV: 86.9 fL (ref 80.0–100.0)
MPV: 11.7 fL (ref 7.5–12.5)
Monocytes Relative: 6.1 %
Neutro Abs: 4464 {cells}/uL (ref 1500–7800)
Neutrophils Relative %: 64.7 %
Platelets: 256 10*3/uL (ref 140–400)
RBC: 5.11 10*6/uL — ABNORMAL HIGH (ref 3.80–5.10)
RDW: 13.2 % (ref 11.0–15.0)
Total Lymphocyte: 25 %
WBC: 6.9 10*3/uL (ref 3.8–10.8)

## 2023-04-04 LAB — COMPLETE METABOLIC PANEL WITH GFR
AG Ratio: 2 (calc) (ref 1.0–2.5)
ALT: 19 U/L (ref 6–29)
AST: 13 U/L (ref 10–35)
Albumin: 4.7 g/dL (ref 3.6–5.1)
Alkaline phosphatase (APISO): 85 U/L (ref 37–153)
BUN: 18 mg/dL (ref 7–25)
CO2: 25 mmol/L (ref 20–32)
Calcium: 9.9 mg/dL (ref 8.6–10.4)
Chloride: 103 mmol/L (ref 98–110)
Creat: 0.76 mg/dL (ref 0.60–1.00)
Globulin: 2.3 g/dL (ref 1.9–3.7)
Glucose, Bld: 118 mg/dL — ABNORMAL HIGH (ref 65–99)
Potassium: 4.5 mmol/L (ref 3.5–5.3)
Sodium: 140 mmol/L (ref 135–146)
Total Bilirubin: 0.8 mg/dL (ref 0.2–1.2)
Total Protein: 7 g/dL (ref 6.1–8.1)
eGFR: 83 mL/min/{1.73_m2} (ref >=60)

## 2023-04-04 LAB — LIPID PANEL
Cholesterol: 140 mg/dL (ref <200)
HDL: 51 mg/dL (ref >=50)
LDL Cholesterol (Calc): 66 mg/dL
Non-HDL Cholesterol (Calc): 89 mg/dL (ref <130)
Total CHOL/HDL Ratio: 2.7 (calc) (ref <5.0)
Triglycerides: 151 mg/dL — ABNORMAL HIGH (ref <150)

## 2023-04-04 LAB — HEMOGLOBIN A1C
Hgb A1c MFr Bld: 5.6 %{Hb} (ref <5.7)
Mean Plasma Glucose: 114 mg/dL
eAG (mmol/L): 6.3 mmol/L

## 2023-04-15 ENCOUNTER — Other Ambulatory Visit: Payer: Self-pay | Admitting: Nurse Practitioner

## 2023-04-20 ENCOUNTER — Encounter: Payer: Self-pay | Admitting: Nurse Practitioner

## 2023-04-20 MED ORDER — MOUNJARO 7.5 MG/0.5ML ~~LOC~~ SOAJ: 7.5000 mg | SUBCUTANEOUS | 2 refills | Status: DC

## 2023-07-10 ENCOUNTER — Encounter: Payer: Self-pay | Admitting: Internal Medicine

## 2023-07-10 NOTE — Progress Notes (Unsigned)
[facility]     ADULT & ADOLESCENT     INTERNAL MEDICINE  Lucky Cowboy, M.D.          Rance Muir, A.NP        Adela Glimpse, F.NP  [facility] [address]  [place], [place]. [address] Telephone [phone] Telefax [phone]   Future Appointments  Date Time Provider Department  07/11/2023                  3 mo f/u   9:30 AM Lucky Cowboy, MD [facility]  12/28/2023                  cpe  3:00 PM Raynelle Dick, NP [facility]  04/02/2024                  wellness  9:00 AM Adela Glimpse, NP [facility]        This very nice 72 y.o. DWF with  HTN, HLD, T2_NIDDM, GERD and Vitamin D Deficiency who resents for 6 month f/u.         HTN predates circa  1994.   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 Heart Cath in 2006 was normal and later in 2009, she had  a Negative Myoview. Patient's BP has been controlled at home and patient denies any cardiac symptoms as chest pain, palpitations, shortness of breath, dizziness or ankle swelling. Today's BP is at goal -                      .        Patient's hyperlipidemia is controlled with diet and Rosuvastatin.  Patient denies myalgias or other medication SE's. 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 Patient is down 23# from 208# to 185# since starting [facility]. Patient denies reactive hypoglycemic symptoms, visual blurring, diabetic polys or paresthesias. Last A1c was not at goal :  Lab Results  Component Value Date   HGBA1C 6.1 (H) 11/29/2021   Wt Readings from Last 3 Encounters:  07/11/23 185 lb 9.6 oz   04/03/23 189 lb 12.8 oz   12/28/22 192 lb  9.6 oz   Nov 2023       208 lb        Finally, patient has history of Vitamin D Deficiency ("20" /2008) and last Vitamin D was at goal :  Lab Results  Component Value Date   VD25OH 112 (H) 11/29/2021       Current Outpatient Medications  Medication Instructions   aspirin EC 81 mg, Oral, Daily   bisoprolol-hctz 10 /6.25 MG tablet TAKE 1 TABLET DAILY    VITAMIN D  5000 u 2 tablets (10,000 u) Every morning   losartan (COZAAR) 50 MG tablet TAKE 1 TABLET DAILY    metFORMIN -XR 500 MG  TAKE 2 TABLETS TWICE DAILY    Mounjaro 7.5 mg, Subcutaneous, Weekly   Rosuvastatin  20 MG tablet TAKE 1 TABLET 3x/wk M,W,F    No Known Allergies    Past Medical History:  Diagnosis  Date   Arthritis    hands, feet   Atherosclerosis of coronary artery of native heart 09/2002   per cardiac cath 10-10-2002 mild non-obstructive of LAD   CKD (chronic kidney disease), stage II    Endometrial polyp    Hemorrhoids    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) and release by cardiology 2012;   10-01-2018 per pt denies any symptoms past several years   per cath 10-10-2002  ef 25% with wall motion abnormalities;  follow-up cath 11-22-2004  resolution wall motion abnormalities and normal LVF;   echo 11-25-2010  ef 55%, mild LAE,  trivial MR, PR, TR   HTN (hypertension)    Hypercholesterolemia    Right sided sciatica 12/12/2017   Type 2 diabetes mellitus (HCC)    followed by pcp     Health Maintenance  Topic Date Due   DEXA SCAN  02/04/2020   COVID-19 Vaccine (4 - Booster for Pfizer series) 07/11/2020   Zoster Vaccines- Shingrix (2 of 2) 08/23/2021   FOOT EXAM  11/16/2021   HEMOGLOBIN A1C  12/08/2021   INFLUENZA VACCINE  03/15/2022   MAMMOGRAM  08/11/2022   OPHTHALMOLOGY EXAM  11/10/2022   TETANUS/TDAP  03/11/2024   Pneumonia Vaccine 5+ Years old  Completed   Hepatitis C Screening  Completed   HPV VACCINES  Aged Out     Immunization History  Administered  Date(s) Administered   Influenza, High Dose Seasonal PF 04/30/2015, 05/23/2017, 05/16/2020   Influenza 04/29/2019, 04/25/2021   PFIZER SARS-COV-2 Vacc 10/19/2019, 11/09/2019, 05/16/2020   Pneumococcal -13 11/01/2016   Pneumococcal -23 04/30/2015   Tdap 03/11/2014   Zoster Recombinat (Shingrix) 06/28/2021   Zoster, Live 05/23/2012    Last Colon - 01/23/2015 - Dr Arlyce Dice - Recc 10 yr  F/u  Colon due June 2026  Last MGM -  08/22/2022  Last dexaBMD- 09/15/2022  Past Surgical History:  Procedure Laterality Date   CARDIAC CATHETERIZATION  11/25/2004  dr Riley Kill   mild non-obstrucitve coronary atherosclerosis involving LAD,  normal LVF and resolution wall abnormalities from previous cath   CARDIAC CATHETERIZATION  10/10/2002   dr Chales Abrahams   mild non-obstructive cad,  ef 25% with anterior akinesis, apical hypokinesis and inferior dyskinesis   CATARACT EXTRACTION W/ INTRAOCULAR LENS  IMPLANT, BILATERAL  07/2009   CESAREAN SECTION  x3   last one 10/ 1988    W/ BILATERAL TUBAL LIGATION WITH LAST C/S   CHOLECYSTECTOMY OPEN  1979   DILATATION & CURETTAGE/HYSTEROSCOPY WITH MYOSURE N/A 10/05/2018   Procedure: DILATATION & CURETTAGE/HYSTEROSCOPY WITH MYOSURE;  Surgeon: Dara Lords, MD;  Location:  [facility];  Service: Gynecology;  Laterality: N/A;   KNEE ARTHROSCOPY WITH MEDIAL MENISECTOMY Left 02/25/2014   Procedure: LEFT KNEE ARTHROSCOPY WITH MEDIAL AND LATERAL MENISECTOMY, MICROFRACTURE, SYNOVECTOMY SUPRA PATELLA;  Surgeon: Jacki Cones, MD;  Location: [facility];  Service: Orthopedics;  Laterality: Left;   TOE SURGERY  1990   BILATERAL GREAT TOE'S     Family History  Problem Relation Age of Onset   Hypertension Mother    COPD Mother    Thyroid disease Mother    Heart attack Father    Heart disease Father    Thyroid disease Sister    Arthritis Sister        Rheumatoid   Hypertension Sister    Luiz Blare' disease Sister    Colon cancer Maternal Grandfather       Social History   Tobacco Use   Smoking  status: Never   Smokeless tobacco: Never  Vaping Use   Vaping Use: Never used  Substance Use Topics   Alcohol use: Yes    Alcohol/week: 0.0 standard drinks    Comment: rare; twice yearly   Drug use: Never      ROS Constitutional: Denies fever, chills, weight loss/gain, headaches, insomnia,  night sweats, and change in appetite. Does c/o fatigue. Eyes: Denies redness, blurred vision, diplopia, discharge, itchy, watery eyes.  ENT: Denies discharge, congestion, post nasal drip, epistaxis, sore throat, earache, hearing loss, dental pain, Tinnitus, Vertigo, Sinus pain, snoring.  Cardio: Denies chest pain, palpitations, irregular heartbeat, syncope, dyspnea, diaphoresis, orthopnea, PND, claudication, edema Respiratory: denies cough, dyspnea, DOE, pleurisy, hoarseness, laryngitis, wheezing.  Gastrointestinal: Denies dysphagia, heartburn, reflux, water brash, pain, cramps, nausea, vomiting, bloating, diarrhea, constipation, hematemesis, melena, hematochezia, jaundice, hemorrhoids Genitourinary: Denies dysuria, frequency, urgency, nocturia, hesitancy, discharge, hematuria, flank pain Breast: Breast lumps, nipple discharge, bleeding.  Musculoskeletal: Denies arthralgia, myalgia, stiffness, Jt. Swelling, pain, limp, and strain/sprain. Denies falls. Skin: Denies puritis, rash, hives, warts, acne, eczema, changing in skin lesion Neuro: No weakness, tremor, incoordination, spasms, paresthesia, pain Psychiatric: Denies confusion, memory loss, sensory loss. Denies Depression. Endocrine: Denies change in weight, skin, hair change, nocturia, and paresthesia, diabetic polys, visual blurring, hyper / hypo glycemic episodes.  Heme/Lymph: No excessive bleeding, bruising, enlarged lymph nodes.  Physical Exam  BP 110/70   Pulse 94   Temp 97.9 F (36.6 C)   Resp 17   Ht 5\' 2"  (1.575 m)   Wt 185 lb 9.6 oz (84.2 kg)   SpO2 98%   BMI 33.95 kg/m   General  Appearance: Over nourished and in no apparent distress.  Eyes: PERRLA, EOMs, conjunctiva no swelling or erythema, normal fundi and vessels. Sinuses: No frontal/maxillary tenderness ENT/Mouth: EACs patent / TMs  nl. Nares clear without erythema, swelling, mucoid exudates. Oral hygiene is good. No erythema, swelling, or exudate. Tongue normal, non-obstructing. Tonsils not swollen or erythematous. Hearing normal.  Neck: Supple, thyroid not palpable. No bruits, nodes or JVD. Respiratory: Respiratory effort normal.  BS equal and clear bilateral without rales, rhonci, wheezing or stridor. Cardio: Heart sounds are normal with regular rate and rhythm and no murmurs, rubs or gallops. Peripheral pulses are normal and equal bilaterally without edema. No aortic or femoral bruits. Chest: symmetric with normal excursions and percussion. Breasts: deferred to recent Negative MGM Abdomen: Flat, soft with bowel sounds active. Nontender, no guarding, rebound, hernias, masses, or organomegaly.  Lymphatics: Non tender without lymphadenopathy.  Musculoskeletal: Full ROM all peripheral extremities, joint stability, 5/5 strength, and normal gait. Skin: Warm and dry without rashes, lesions, cyanosis, clubbing or  ecchymosis. Several sebaceous cysts of anterior Rt axilla.  Neuro: Cranial nerves intact, reflexes equal bilaterally. Normal muscle tone, no cerebellar symptoms. Sensation intact.  Pysch: Alert and oriented X 3, normal affect, Insight and Judgment appropriate.    Assessment and Plan   1. Essential hypertension  - CBC with Differential/Platelet - COMPLETE METABOLIC PANEL WITH GFR - Magnesium - TSH  2. Hyperlipidemia associated with type 2 diabetes mellitus (HCC)  - Lipid panel - TSH  3. Type 2 diabetes mellitus with stage 2 chronic kidney  disease, without long-term current use of insulin (HCC)  - Hemoglobin A1c - Insulin, random  4. Vitamin D deficiency  - VITAMIN D 25 Hydroxy   5.  Gastroesophageal reflux disease  - CBC with Differential/Platelet  6. Medication management  - CBC with Differential/Platelet - COMPLETE METABOLIC PANEL WITH GFR -  Magnesium - Lipid panel - TSH - Hemoglobin A1c - Insulin, random - VITAMIN D 25 Hydroxy         Patient was counseled in prudent diet to achieve/maintain BMI less than 25 for weight control, BP monitoring, regular exercise and medications. Discussed med's effects and SE's. Screening labs and tests as requested with regular follow-up as recommended. Over 40 minutes of exam, counseling, chart review and high complex critical decision making was performed.   Marinus Maw, MD

## 2023-07-10 NOTE — Patient Instructions (Signed)

## 2023-07-11 ENCOUNTER — Ambulatory Visit (INDEPENDENT_AMBULATORY_CARE_PROVIDER_SITE_OTHER): Payer: Medicare PPO | Admitting: Internal Medicine

## 2023-07-11 ENCOUNTER — Encounter: Payer: Self-pay | Admitting: Internal Medicine

## 2023-07-11 VITALS — BP 110/70 | HR 94 | Temp 97.9°F | Resp 17 | Ht 62.0 in | Wt 185.6 lb

## 2023-07-11 DIAGNOSIS — E785 Hyperlipidemia, unspecified: Secondary | ICD-10-CM

## 2023-07-11 DIAGNOSIS — E559 Vitamin D deficiency, unspecified: Secondary | ICD-10-CM | POA: Diagnosis not present

## 2023-07-11 DIAGNOSIS — E1169 Type 2 diabetes mellitus with other specified complication: Secondary | ICD-10-CM | POA: Diagnosis not present

## 2023-07-11 DIAGNOSIS — I10 Essential (primary) hypertension: Secondary | ICD-10-CM | POA: Diagnosis not present

## 2023-07-11 DIAGNOSIS — E1122 Type 2 diabetes mellitus with diabetic chronic kidney disease: Secondary | ICD-10-CM

## 2023-07-11 DIAGNOSIS — K219 Gastro-esophageal reflux disease without esophagitis: Secondary | ICD-10-CM

## 2023-07-11 DIAGNOSIS — Z79899 Other long term (current) drug therapy: Secondary | ICD-10-CM

## 2023-07-11 DIAGNOSIS — N182 Chronic kidney disease, stage 2 (mild): Secondary | ICD-10-CM | POA: Diagnosis not present

## 2023-07-11 MED ORDER — TIRZEPATIDE 10 MG/0.5ML ~~LOC~~ SOAJ: SUBCUTANEOUS | 0 refills | Status: DC

## 2023-07-12 LAB — LIPID PANEL
Cholesterol: 134 mg/dL (ref <200)
HDL: 53 mg/dL (ref >=50)
LDL Cholesterol (Calc): 60 mg/dL
Non-HDL Cholesterol (Calc): 81 mg/dL (ref <130)
Total CHOL/HDL Ratio: 2.5 (calc) (ref <5.0)
Triglycerides: 125 mg/dL (ref <150)

## 2023-07-12 LAB — CBC WITH DIFFERENTIAL/PLATELET
Absolute Lymphocytes: 1593 {cells}/uL (ref 850–3900)
Absolute Monocytes: 389 {cells}/uL (ref 200–950)
Basophils Absolute: 47 {cells}/uL (ref 0–200)
Basophils Relative: 0.8 %
Eosinophils Absolute: 118 {cells}/uL (ref 15–500)
Eosinophils Relative: 2 %
HCT: 43.7 % (ref 35.0–45.0)
Hemoglobin: 14.5 g/dL (ref 11.7–15.5)
MCH: 28.9 pg (ref 27.0–33.0)
MCHC: 33.2 g/dL (ref 32.0–36.0)
MCV: 87.2 fL (ref 80.0–100.0)
MPV: 11.8 fL (ref 7.5–12.5)
Monocytes Relative: 6.6 %
Neutro Abs: 3752 {cells}/uL (ref 1500–7800)
Neutrophils Relative %: 63.6 %
Platelets: 240 10*3/uL (ref 140–400)
RBC: 5.01 10*6/uL (ref 3.80–5.10)
RDW: 12.6 % (ref 11.0–15.0)
Total Lymphocyte: 27 %
WBC: 5.9 10*3/uL (ref 3.8–10.8)

## 2023-07-12 LAB — COMPLETE METABOLIC PANEL WITH GFR
AG Ratio: 2.1 (calc) (ref 1.0–2.5)
ALT: 14 U/L (ref 6–29)
AST: 14 U/L (ref 10–35)
Albumin: 4.8 g/dL (ref 3.6–5.1)
Alkaline phosphatase (APISO): 66 U/L (ref 37–153)
BUN: 12 mg/dL (ref 7–25)
CO2: 27 mmol/L (ref 20–32)
Calcium: 10.5 mg/dL — ABNORMAL HIGH (ref 8.6–10.4)
Chloride: 101 mmol/L (ref 98–110)
Creat: 0.6 mg/dL (ref 0.60–1.00)
Globulin: 2.3 g/dL (ref 1.9–3.7)
Glucose, Bld: 89 mg/dL (ref 65–99)
Potassium: 4.2 mmol/L (ref 3.5–5.3)
Sodium: 139 mmol/L (ref 135–146)
Total Bilirubin: 0.9 mg/dL (ref 0.2–1.2)
Total Protein: 7.1 g/dL (ref 6.1–8.1)
eGFR: 95 mL/min/{1.73_m2} (ref >=60)

## 2023-07-12 LAB — HEMOGLOBIN A1C
Hgb A1c MFr Bld: 5.3 %{Hb} (ref <5.7)
Mean Plasma Glucose: 105 mg/dL
eAG (mmol/L): 5.8 mmol/L

## 2023-07-12 LAB — INSULIN, RANDOM: Insulin: 12.2 u[IU]/mL

## 2023-07-12 LAB — MAGNESIUM: Magnesium: 1.7 mg/dL (ref 1.5–2.5)

## 2023-07-12 LAB — VITAMIN D 25 HYDROXY (VIT D DEFICIENCY, FRACTURES): Vit D, 25-Hydroxy: 112 ng/mL — ABNORMAL HIGH (ref 30–100)

## 2023-07-12 LAB — TSH: TSH: 2.42 m[IU]/L (ref 0.40–4.50)

## 2023-07-12 NOTE — Progress Notes (Signed)
[][][][][][][][][][][][][][][][][][][][][][][][][][][][][][][][][][][][][][][][][]][][][][][][][][][][][][][][][][][][][][][][][[][][][][]   -   CBC is perfectly Normal Red & White cells now    [] [] [] [] [] [] [] [] [] [] [] [] [] [] [] [] [] [] [] [] [] [] [] [] [] [] [] [] [] [] [] [] [] [] [] [] [] [] [] [] [] ][] [] [] [] [] [] [] [] [] [] [] [] [] [] [] [] [] [] [] [] [] [] [[] [] [] [] []

## 2023-07-24 ENCOUNTER — Other Ambulatory Visit: Payer: Self-pay | Admitting: Nurse Practitioner

## 2023-08-24 DIAGNOSIS — Z1231 Encounter for screening mammogram for malignant neoplasm of breast: Secondary | ICD-10-CM | POA: Diagnosis not present

## 2023-08-24 LAB — HM MAMMOGRAPHY

## 2023-08-29 ENCOUNTER — Other Ambulatory Visit: Payer: Self-pay | Admitting: Internal Medicine

## 2023-08-29 DIAGNOSIS — E1122 Type 2 diabetes mellitus with diabetic chronic kidney disease: Secondary | ICD-10-CM

## 2023-08-29 MED ORDER — TIRZEPATIDE 12.5 MG/0.5ML ~~LOC~~ SOAJ: SUBCUTANEOUS | 0 refills | Status: DC

## 2023-09-04 ENCOUNTER — Other Ambulatory Visit: Payer: Self-pay

## 2023-09-04 ENCOUNTER — Other Ambulatory Visit: Payer: Self-pay | Admitting: Internal Medicine

## 2023-09-04 MED ORDER — TIRZEPATIDE 12.5 MG/0.5ML ~~LOC~~ SOAJ: SUBCUTANEOUS | 0 refills | Status: DC

## 2023-09-12 ENCOUNTER — Other Ambulatory Visit: Payer: Self-pay

## 2023-09-12 MED ORDER — TIRZEPATIDE 12.5 MG/0.5ML ~~LOC~~ SOAJ: SUBCUTANEOUS | 0 refills | Status: DC

## 2023-10-11 ENCOUNTER — Ambulatory Visit: Payer: Medicare PPO | Admitting: Nurse Practitioner

## 2023-10-23 ENCOUNTER — Other Ambulatory Visit: Payer: Self-pay

## 2023-10-23 DIAGNOSIS — E782 Mixed hyperlipidemia: Secondary | ICD-10-CM

## 2023-10-23 MED ORDER — ROSUVASTATIN CALCIUM 20 MG PO TABS: ORAL_TABLET | ORAL | 3 refills | Status: DC

## 2023-10-23 MED ORDER — LOSARTAN POTASSIUM 50 MG PO TABS: ORAL_TABLET | ORAL | 0 refills | Status: DC

## 2023-11-02 ENCOUNTER — Telehealth: Payer: Self-pay

## 2023-11-02 ENCOUNTER — Encounter: Payer: Self-pay | Admitting: Family Medicine

## 2023-11-02 ENCOUNTER — Ambulatory Visit: Payer: Medicare PPO | Admitting: Family Medicine

## 2023-11-02 VITALS — BP 112/82 | HR 86 | Temp 97.7°F | Ht 62.0 in | Wt 181.0 lb

## 2023-11-02 DIAGNOSIS — Z7985 Long-term (current) use of injectable non-insulin antidiabetic drugs: Secondary | ICD-10-CM

## 2023-11-02 DIAGNOSIS — Z79899 Other long term (current) drug therapy: Secondary | ICD-10-CM | POA: Diagnosis not present

## 2023-11-02 DIAGNOSIS — H9201 Otalgia, right ear: Secondary | ICD-10-CM | POA: Insufficient documentation

## 2023-11-02 DIAGNOSIS — E559 Vitamin D deficiency, unspecified: Secondary | ICD-10-CM | POA: Diagnosis not present

## 2023-11-02 DIAGNOSIS — I10 Essential (primary) hypertension: Secondary | ICD-10-CM | POA: Diagnosis not present

## 2023-11-02 DIAGNOSIS — N182 Chronic kidney disease, stage 2 (mild): Secondary | ICD-10-CM

## 2023-11-02 DIAGNOSIS — H65191 Other acute nonsuppurative otitis media, right ear: Secondary | ICD-10-CM | POA: Diagnosis not present

## 2023-11-02 DIAGNOSIS — E1122 Type 2 diabetes mellitus with diabetic chronic kidney disease: Secondary | ICD-10-CM

## 2023-11-02 LAB — MICROALBUMIN / CREATININE URINE RATIO
Creatinine,U: 70 mg/dL
Microalb Creat Ratio: 12.4 mg/g (ref 0.0–30.0)
Microalb, Ur: 0.9 mg/dL (ref 0.0–1.9)

## 2023-11-02 LAB — CBC WITH DIFFERENTIAL/PLATELET
Basophils Absolute: 0.1 10*3/uL (ref 0.0–0.1)
Basophils Relative: 0.8 % (ref 0.0–3.0)
Eosinophils Absolute: 0.2 10*3/uL (ref 0.0–0.7)
Eosinophils Relative: 2.7 % (ref 0.0–5.0)
HCT: 42.2 % (ref 36.0–46.0)
Hemoglobin: 14.1 g/dL (ref 12.0–15.0)
Lymphocytes Relative: 25.4 % (ref 12.0–46.0)
Lymphs Abs: 1.8 10*3/uL (ref 0.7–4.0)
MCHC: 33.4 g/dL (ref 30.0–36.0)
MCV: 86.9 fl (ref 78.0–100.0)
Monocytes Absolute: 0.5 10*3/uL (ref 0.1–1.0)
Monocytes Relative: 6.4 % (ref 3.0–12.0)
Neutro Abs: 4.7 10*3/uL (ref 1.4–7.7)
Neutrophils Relative %: 64.7 % (ref 43.0–77.0)
Platelets: 257 10*3/uL (ref 150.0–400.0)
RBC: 4.86 Mil/uL (ref 3.87–5.11)
RDW: 13.8 % (ref 11.5–15.5)
WBC: 7.3 10*3/uL (ref 4.0–10.5)

## 2023-11-02 LAB — COMPREHENSIVE METABOLIC PANEL
ALT: 19 U/L (ref 0–35)
AST: 16 U/L (ref 0–37)
Albumin: 4.6 g/dL (ref 3.5–5.2)
Alkaline Phosphatase: 60 U/L (ref 39–117)
BUN: 11 mg/dL (ref 6–23)
CO2: 29 meq/L (ref 19–32)
Calcium: 10 mg/dL (ref 8.4–10.5)
Chloride: 102 meq/L (ref 96–112)
Creatinine, Ser: 0.67 mg/dL (ref 0.40–1.20)
GFR: 86.57 mL/min (ref >60.00)
Glucose, Bld: 101 mg/dL — ABNORMAL HIGH (ref 70–99)
Potassium: 4.1 meq/L (ref 3.5–5.1)
Sodium: 138 meq/L (ref 135–145)
Total Bilirubin: 0.7 mg/dL (ref 0.2–1.2)
Total Protein: 7 g/dL (ref 6.0–8.3)

## 2023-11-02 LAB — VITAMIN D 25 HYDROXY (VIT D DEFICIENCY, FRACTURES): VITD: >120 ng/mL

## 2023-11-02 LAB — LIPID PANEL
Cholesterol: 130 mg/dL (ref 0–200)
HDL: 48 mg/dL (ref >39.00)
LDL Cholesterol: 52 mg/dL (ref 0–99)
NonHDL: 81.85
Total CHOL/HDL Ratio: 3
Triglycerides: 148 mg/dL (ref 0.0–149.0)
VLDL: 29.6 mg/dL (ref 0.0–40.0)

## 2023-11-02 LAB — HEMOGLOBIN A1C: Hgb A1c MFr Bld: 5.4 % (ref 4.6–6.5)

## 2023-11-02 MED ORDER — TIRZEPATIDE 12.5 MG/0.5ML ~~LOC~~ SOAJ: SUBCUTANEOUS | 1 refills | Status: DC

## 2023-11-02 NOTE — Telephone Encounter (Signed)
 CRITICAL VALUE STICKER  CRITICAL VALUE: vitamin D greater than 120  RECEIVER (on-site recipient of call): Dahlia Client  DATE & TIME NOTIFIED: 11/02/23 at 2:39 pm  MESSENGER (representative from lab): Saa  MD NOTIFIED: Moshe Cipro

## 2023-11-02 NOTE — Telephone Encounter (Signed)
 Spoke with patient, advised her to D/C vitamin D per PCP, will reach out to patient with when to resume if provider recommends.

## 2023-11-02 NOTE — Assessment & Plan Note (Signed)
 Controlled, will check A1c today. Tirzepatide refilled

## 2023-11-02 NOTE — Patient Instructions (Signed)

## 2023-11-02 NOTE — Assessment & Plan Note (Signed)
 Discussed using oral antihistamine to help dry up fluid in ears and eustachian tubes

## 2023-11-02 NOTE — Assessment & Plan Note (Signed)
 Continue current supplementation regimen, rechecking labs today

## 2023-11-02 NOTE — Progress Notes (Signed)
 New Patient Office Visit  Subjective    Patient ID: [name], female    DOB: 1951/07/01  Age: 73 y.o. MRN: [id number]  CC:  Chief Complaint  Patient presents with   Establish Care    R ear popping, for 3 weeks, has had some sinus drainage    HPI [name] presents to establish care today. Past medical history includes diabetes, hyperlipidemia, hypertension, osteopenia, vitamin D deficiency, aortic atherosclerosis. Reports compliance with medication regimen. Requesting refill of Mounjaro 12.5 mg once weekly today. She is fasting today. She is up-to-date on routine screenings. She is up-to-date on routine vaccinations. Reports that she has been having ear popping and crackling for the last 3 weeks.  Has not attempted OTC treatment.  Denies pain, discharge, bleeding from the right ear.  Denies abdominal pain, nausea, vomiting, diarrhea, rash, fever, chills, other symptoms. Denies known sick contacts or seasonal allergies. Denies other concerns today.   Outpatient Encounter Medications as of 11/02/2023  Medication Sig   aspirin EC 81 MG tablet Take 81 mg by mouth daily.   bisoprolol-hydrochlorothiazide (ZIAC) 10-6.25 MG tablet TAKE 1 TABLET BY MOUTH DAILY FOR BLOOD PRESSURE   Cholecalciferol (VITAMIN D-3) 5000 UNITS TABS Take 2 tablets by mouth every morning.   losartan (COZAAR) 50 MG tablet TAKE 1 TABLET BY MOUTH DAILY FOR BLOOD PRESSURE   metFORMIN (GLUCOPHAGE-XR) 500 MG 24 hr tablet TAKE 2 TABLETS BY MOUTH TWICE DAILY WITH MEALS FOR DIABETES   rosuvastatin (CRESTOR) 20 MG tablet TAKE 1 TABLET BY MOUTH DAILY FOR CHOLESTEROL M,W,F   [DISCONTINUED] tirzepatide (MOUNJARO) 12.5 MG/0.5ML Pen Inject   1 pen (12.5 mg)   into Skin  every 7 days   for Diabetes  (Dx: e11.29)   tirzepatide (MOUNJARO) 12.5 MG/0.5ML Pen Inject   1 pen (12.5 mg)   into Skin  every 7 days   for Diabetes  (Dx: e11.29)   No facility-administered encounter medications on file as of 11/02/2023.     Past Medical History:  Diagnosis Date   Arthritis    hands, feet   Atherosclerosis of coronary artery of native heart 09/2002   per cardiac cath 10-10-2002 mild non-obstructive of LAD   Cataract 2010   CKD (chronic kidney disease), stage II    Endometrial polyp    Hemorrhoids    History of cardiomyopathy in adulthood per cardiologist, dr Riley Kill, note in epic , Takotsuku cardiomyopathy (resolved) and release by cardiology 2012;   10-01-2018 per pt denies any symptoms past several years   per cath 10-10-2002  ef 25% with wall motion abnormalities;  follow-up cath 11-22-2004  resolution wall motion abnormalities and normal LVF;   echo 11-25-2010  ef 55%, mild LAE,  trivial MR, PR, TR   HTN (hypertension)    Hypercholesterolemia    Right sided sciatica 12/12/2017   Type 2 diabetes mellitus (HCC)    followed by pcp    Past Surgical History:  Procedure Laterality Date   CARDIAC CATHETERIZATION  11/25/2004  dr Riley Kill   mild non-obstrucitve coronary atherosclerosis involving LAD,  normal LVF and resolution wall abnormalities from previous cath   CARDIAC CATHETERIZATION  10/10/2002   dr Chales Abrahams   mild non-obstructive cad,  ef 25% with anterior akinesis, apical hypokinesis and inferior dyskinesis   CATARACT EXTRACTION W/ INTRAOCULAR LENS  IMPLANT, BILATERAL  07/2009   CESAREAN SECTION  x3   last one 10/ 1988    W/ BILATERAL TUBAL LIGATION WITH LAST C/S   CHOLECYSTECTOMY OPEN  1979   DILATATION & CURETTAGE/HYSTEROSCOPY WITH MYOSURE [name] 10/05/2018   Procedure: DILATATION & CURETTAGE/HYSTEROSCOPY WITH MYOSURE;  Surgeon: Dara Lords, MD;  Location: [facility];  Service: Gynecology;  Laterality: [name];   JOINT REPLACEMENT  2024   KNEE ARTHROSCOPY WITH MEDIAL MENISECTOMY Left 02/25/2014   Procedure: LEFT KNEE ARTHROSCOPY WITH MEDIAL AND LATERAL MENISECTOMY, MICROFRACTURE, SYNOVECTOMY SUPRA PATELLA;  Surgeon: Jacki Cones, MD;  Location: [facility];  Service: Orthopedics;   Laterality: Left;   MASS EXCISION Right 04/15/2022   Procedure: MINOR EXCISION OF MASS;  Surgeon: Luretha Murphy, MD;  Location: [facility];  Service: General;  Laterality: Right;   TOE SURGERY  1990   BILATERAL GREAT TOE'S   TUBAL LIGATION  1988    Family History  Problem Relation Age of Onset   Hypertension Mother    COPD Mother    Thyroid disease Mother    Heart attack Father    Heart disease Father    Thyroid disease Sister    Arthritis Sister        Rheumatoid   COPD Sister    Hypertension Sister    Graves' disease Sister    Colon cancer Maternal Grandfather    ADD / ADHD Son    Heart disease Brother    Miscarriages / [place] Daughter     Social History   Socioeconomic History   Marital status: Divorced    Spouse name: Not on file   Number of children: Not on file   Years of education: Not on file   Highest education level: Bachelor's degree (e.g., BA, AB, BS)  Occupational History   Not on file  Tobacco Use   Smoking status: Never   Smokeless tobacco: Never  Vaping Use   Vaping status: Never Used  Substance and Sexual Activity   Alcohol use: Not Currently    Comment: rare; twice yearly   Drug use: Never   Sexual activity: Not Currently    Birth control/protection: Surgical    Comment: 1st intercourse 73 yo-Fewer than 5 partners  Other Topics Concern   Not on file  Social History Narrative   Not on file   Social Drivers of Health   Financial Resource Strain: Low Risk  (10/31/2023)   Overall Financial Resource Strain (CARDIA)    Difficulty of Paying Living Expenses: Not hard at all  Food Insecurity: No Food Insecurity (10/31/2023)   Hunger Vital Sign    Worried About Running Out of Food in the Last Year: Never true    Ran Out of Food in the Last Year: Never true  Transportation Needs: No Transportation Needs (10/31/2023)   PRAPARE - Administrator, Civil Service (Medical): No    Lack of Transportation (Non-Medical): No   Physical Activity: Unknown (10/31/2023)   Exercise Vital Sign    Days of Exercise per Week: 0 days    Minutes of Exercise per Session: Not on file  Stress: No Stress Concern Present (10/31/2023)   [employer] of Occupational Health - Occupational Stress Questionnaire    Feeling of Stress : Not at all  Social Connections: Moderately Isolated (10/31/2023)   Social Connection and Isolation Panel [NHANES]    Frequency of Communication with Friends and Family: More than three times a week    Frequency of Social Gatherings with Friends and Family: Twice a week    Attends Religious Services: 1 to 4 times per year    Active Member of [employer] or Organizations:  No    Attends Banker Meetings: Not on file    Marital Status: Divorced  Intimate Partner Violence: Not on file    ROS Per HPI      Objective    BP 112/82 (BP Location: Left Arm, Patient Position: Sitting)   Pulse 86   Temp 97.7 F (36.5 C) (Temporal)   Ht 5\' 2"  (1.575 m)   Wt 181 lb (82.1 kg)   SpO2 98%   BMI 33.11 kg/m   Physical Exam Vitals and nursing note reviewed.  Constitutional:      General: She is not in acute distress.    Appearance: Normal appearance. She is obese.  HENT:     Head: Normocephalic and atraumatic.     Right Ear: A middle ear effusion is present. Tympanic membrane is not erythematous or bulging.     Nose: Nose normal.     Mouth/Throat:     Mouth: Mucous membranes are moist.     Pharynx: Oropharynx is clear.  Eyes:     Extraocular Movements: Extraocular movements intact.     Pupils: Pupils are equal, round, and reactive to light.  Cardiovascular:     Rate and Rhythm: Normal rate and regular rhythm.     Heart sounds: Normal heart sounds.  Pulmonary:     Effort: Pulmonary effort is normal. No respiratory distress.     Breath sounds: Normal breath sounds. No wheezing, rhonchi or rales.  Musculoskeletal:        General: Normal range of motion.     Cervical back: Normal range  of motion.  Neurological:     General: No focal deficit present.     Mental Status: She is alert and oriented to person, place, and time.  Psychiatric:        Mood and Affect: Mood normal.        Thought Content: Thought content normal.         Assessment & Plan:   Type 2 diabetes mellitus with stage 2 chronic kidney disease, without long-term current use of insulin (HCC) -     Comprehensive metabolic panel -     Hemoglobin A1c -     Tirzepatide; Inject   1 pen (12.5 mg)   into Skin  every 7 days   for Diabetes  (Dx: e11.29)  Dispense: 6 mL; Refill: 1 -     Lipid panel -     Microalbumin / creatinine urine ratio  Essential hypertension Assessment & Plan: Controlled, continue current medication regimen and low-salt diet  Orders: -     CBC with Differential/Platelet -     Comprehensive metabolic panel  Vitamin D deficiency Assessment & Plan: Continue current supplementation regimen, rechecking labs today  Orders: -     VITAMIN D 25 Hydroxy (Vit-D Deficiency, Fractures)  CKD stage 2 due to type 2 diabetes mellitus (HCC) Assessment & Plan: Controlled, will check A1c today. Tirzepatide refilled  Orders: -     Microalbumin / creatinine urine ratio  Acute MEE (middle ear effusion), right Assessment & Plan: Discussed using oral antihistamine to help dry up fluid in ears and eustachian tubes   Medication management -     CBC with Differential/Platelet -     Comprehensive metabolic panel -     Hemoglobin A1c -     Tirzepatide; Inject   1 pen (12.5 mg)   into Skin  every 7 days   for Diabetes  (Dx: e11.29)  Dispense: 6 mL; Refill:  1 -     Lipid panel -     VITAMIN D 25 Hydroxy (Vit-D Deficiency, Fractures) -     Microalbumin / creatinine urine ratio     Return in about 3 months (around 02/02/2024) for med check.   Moshe Cipro, FNP

## 2023-11-02 NOTE — Assessment & Plan Note (Signed)
 Controlled, continue current medication regimen and low-salt diet

## 2023-11-17 ENCOUNTER — Ambulatory Visit: Payer: Medicare PPO | Admitting: Family Medicine

## 2023-11-21 LAB — HM DIABETES EYE EXAM

## 2023-12-28 ENCOUNTER — Encounter: Payer: Medicare PPO | Admitting: Nurse Practitioner

## 2024-01-17 ENCOUNTER — Other Ambulatory Visit: Payer: Self-pay | Admitting: Family

## 2024-01-27 NOTE — Progress Notes (Signed)
 Established Patient Office Visit  Subjective   Patient ID: [name], female    DOB: 1951/04/02  Age: 73 y.o. MRN: [id number]  Chief Complaint  Patient presents with   Follow-up    HPI Patient presents today for medication management. Reports compliance with medication regimen. Requesting refills of losartan  today. Inquiring about the need to stay on current 12.5 mg of Mounjaro  or to increase. She is fasting today. Medical history as outlined below.  Reports that she is still having right ear pain and aching.  Has been taking Allegra, has been using decongestant with little relief. Persistent right ear pain. Denies cough, fever, headaches, other symptoms.  ROS Per HPI    Objective:     BP 120/84 (BP Location: Left Arm, Patient Position: Sitting)   Pulse 84   Temp 97.6 F (36.4 C) (Temporal)   Ht 5' 2 (1.575 m)   Wt 177 lb (80.3 kg)   SpO2 98%   BMI 32.37 kg/m   Physical Exam Vitals and nursing note reviewed.  Constitutional:      General: She is not in acute distress.    Appearance: Normal appearance. She is obese.  HENT:     Head: Normocephalic and atraumatic.     Right Ear: External ear normal. A middle ear effusion is present.     Left Ear: External ear normal. A middle ear effusion is present.     Nose: Nose normal.     Mouth/Throat:     Mouth: Mucous membranes are moist.     Pharynx: Oropharynx is clear.     Comments: Oropharyngeal cobblestoning    Eyes:     Extraocular Movements: Extraocular movements intact.     Pupils: Pupils are equal, round, and reactive to light.    Cardiovascular:     Rate and Rhythm: Normal rate and regular rhythm.     Pulses: Normal pulses.     Heart sounds: Normal heart sounds.  Pulmonary:     Effort: Pulmonary effort is normal. No respiratory distress.     Breath sounds: Normal breath sounds. No wheezing, rhonchi or rales.   Musculoskeletal:        General: Normal range of motion.     Cervical back: Normal  range of motion.     Right lower leg: No edema.     Left lower leg: No edema.  Lymphadenopathy:     Cervical: No cervical adenopathy.   Neurological:     General: No focal deficit present.     Mental Status: She is alert and oriented to person, place, and time.   Psychiatric:        Mood and Affect: Mood normal.        Thought Content: Thought content normal.     No results found for any visits on 02/05/24.   The ASCVD Risk score (Arnett DK, et al., 2019) failed to calculate for the following reasons:   Risk score cannot be calculated because patient has a medical history suggesting prior/existing ASCVD    Assessment & Plan:   Morbid obesity (HCC))  Assessment & Plan: Continue efforts in healthy diet and activity level  Orders: -     CBC with Differential/Platelet -     Comprehensive metabolic panel with GFR  Essential hypertension Assessment & Plan: CBC, CMP, refilled losartan  today  Orders: -     CBC with Differential/Platelet -     Comprehensive metabolic panel with GFR  Hyperlipidemia associated with type 2 diabetes  mellitus (HCC) Assessment & Plan: Lipids today will dose adjust as needed  Orders: -     Lipid panel  Vitamin D  deficiency Assessment & Plan: Vitamin D  today  Orders: -     VITAMIN D  25 Hydroxy (Vit-D Deficiency, Fractures)  CKD stage 2 due to type 2 diabetes mellitus (HCC) Assessment & Plan: A1c, CMP today Continue Mounjaro  12.5 mg once weekly  Orders: -     CBC with Differential/Platelet -     Comprehensive metabolic panel with GFR -     Hemoglobin A1c  Medication management Assessment & Plan: Labs today, will dose adjust medications as needed  Orders: -     CBC with Differential/Platelet -     Comprehensive metabolic panel with GFR -     Hemoglobin A1c -     Lipid panel -     VITAMIN D  25 Hydroxy (Vit-D Deficiency, Fractures) -     Losartan  Potassium; TAKE 1 TABLET BY MOUTH DAILY FOR BLOOD PRESSURE  Dispense: 90 tablet;  Refill: 1  Right ear pain Assessment & Plan: Referral to ENT Continue Allegra, may add in nasal spray to see if this will help  Orders: -     Ambulatory referral to ENT     Return in about 3 months (around 05/07/2024) for meds.    Corean LITTIE Ku, FNP

## 2024-02-05 ENCOUNTER — Encounter: Payer: Self-pay | Admitting: Family Medicine

## 2024-02-05 ENCOUNTER — Telehealth: Payer: Self-pay

## 2024-02-05 ENCOUNTER — Ambulatory Visit: Admitting: Family Medicine

## 2024-02-05 ENCOUNTER — Ambulatory Visit: Payer: Self-pay | Admitting: Family Medicine

## 2024-02-05 DIAGNOSIS — E673 Hypervitaminosis D: Secondary | ICD-10-CM

## 2024-02-05 DIAGNOSIS — E1169 Type 2 diabetes mellitus with other specified complication: Secondary | ICD-10-CM

## 2024-02-05 DIAGNOSIS — I10 Essential (primary) hypertension: Secondary | ICD-10-CM

## 2024-02-05 DIAGNOSIS — H9201 Otalgia, right ear: Secondary | ICD-10-CM

## 2024-02-05 DIAGNOSIS — E559 Vitamin D deficiency, unspecified: Secondary | ICD-10-CM

## 2024-02-05 DIAGNOSIS — Z7985 Long-term (current) use of injectable non-insulin antidiabetic drugs: Secondary | ICD-10-CM | POA: Diagnosis not present

## 2024-02-05 DIAGNOSIS — M1712 Unilateral primary osteoarthritis, left knee: Secondary | ICD-10-CM

## 2024-02-05 DIAGNOSIS — E1122 Type 2 diabetes mellitus with diabetic chronic kidney disease: Secondary | ICD-10-CM

## 2024-02-05 DIAGNOSIS — N182 Chronic kidney disease, stage 2 (mild): Secondary | ICD-10-CM

## 2024-02-05 DIAGNOSIS — E785 Hyperlipidemia, unspecified: Secondary | ICD-10-CM

## 2024-02-05 DIAGNOSIS — Z79899 Other long term (current) drug therapy: Secondary | ICD-10-CM | POA: Diagnosis not present

## 2024-02-05 LAB — COMPREHENSIVE METABOLIC PANEL WITH GFR
ALT: 17 U/L (ref 0–35)
AST: 16 U/L (ref 0–37)
Albumin: 4.7 g/dL (ref 3.5–5.2)
Alkaline Phosphatase: 57 U/L (ref 39–117)
BUN: 13 mg/dL (ref 6–23)
CO2: 28 meq/L (ref 19–32)
Calcium: 10 mg/dL (ref 8.4–10.5)
Chloride: 101 meq/L (ref 96–112)
Creatinine, Ser: 0.73 mg/dL (ref 0.40–1.20)
GFR: 81.31 mL/min (ref >60.00)
Glucose, Bld: 100 mg/dL — ABNORMAL HIGH (ref 70–99)
Potassium: 4.1 meq/L (ref 3.5–5.1)
Sodium: 140 meq/L (ref 135–145)
Total Bilirubin: 0.8 mg/dL (ref 0.2–1.2)
Total Protein: 7.3 g/dL (ref 6.0–8.3)

## 2024-02-05 LAB — LIPID PANEL
Cholesterol: 140 mg/dL (ref 0–200)
HDL: 45.5 mg/dL (ref >39.00)
LDL Cholesterol: 70 mg/dL (ref 0–99)
NonHDL: 94.01
Total CHOL/HDL Ratio: 3
Triglycerides: 121 mg/dL (ref 0.0–149.0)
VLDL: 24.2 mg/dL (ref 0.0–40.0)

## 2024-02-05 LAB — CBC WITH DIFFERENTIAL/PLATELET
Basophils Absolute: 0.1 10*3/uL (ref 0.0–0.1)
Basophils Relative: 0.8 % (ref 0.0–3.0)
Eosinophils Absolute: 0.2 10*3/uL (ref 0.0–0.7)
Eosinophils Relative: 2.6 % (ref 0.0–5.0)
HCT: 41.9 % (ref 36.0–46.0)
Hemoglobin: 14.1 g/dL (ref 12.0–15.0)
Lymphocytes Relative: 24.3 % (ref 12.0–46.0)
Lymphs Abs: 1.6 10*3/uL (ref 0.7–4.0)
MCHC: 33.6 g/dL (ref 30.0–36.0)
MCV: 84.9 fl (ref 78.0–100.0)
Monocytes Absolute: 0.4 10*3/uL (ref 0.1–1.0)
Monocytes Relative: 6.2 % (ref 3.0–12.0)
Neutro Abs: 4.3 10*3/uL (ref 1.4–7.7)
Neutrophils Relative %: 66.1 % (ref 43.0–77.0)
Platelets: 263 10*3/uL (ref 150.0–400.0)
RBC: 4.94 Mil/uL (ref 3.87–5.11)
RDW: 13.7 % (ref 11.5–15.5)
WBC: 6.6 10*3/uL (ref 4.0–10.5)

## 2024-02-05 LAB — HEMOGLOBIN A1C: Hgb A1c MFr Bld: 5.2 % (ref 4.6–6.5)

## 2024-02-05 LAB — VITAMIN D 25 HYDROXY (VIT D DEFICIENCY, FRACTURES): VITD: 119.48 ng/mL (ref 30.00–100.00)

## 2024-02-05 MED ORDER — LOSARTAN POTASSIUM 50 MG PO TABS: ORAL_TABLET | ORAL | 1 refills | Status: DC

## 2024-02-05 NOTE — Assessment & Plan Note (Signed)
 Vitamin D today

## 2024-02-05 NOTE — Assessment & Plan Note (Signed)
 Continue efforts in healthy diet and activity level

## 2024-02-05 NOTE — Assessment & Plan Note (Signed)
 CBC, CMP, refilled losartan  today

## 2024-02-05 NOTE — Addendum Note (Signed)
 Addended by: Ferd Horrigan L on: 02/05/2024 12:06 PM   Modules accepted: Orders

## 2024-02-05 NOTE — Assessment & Plan Note (Signed)
 A1c, CMP today Continue Mounjaro  12.5 mg once weekly

## 2024-02-05 NOTE — Telephone Encounter (Signed)
 Spoke with patient, she is taking a daily multi vitamin. She just discovered today, that her multi vitamin has 1,000 units of vitamin D  in it.. she will hold her multi until advised otherwise. Are additional labs still needed?

## 2024-02-05 NOTE — Assessment & Plan Note (Signed)
 Labs today, will dose adjust medications as needed

## 2024-02-05 NOTE — Assessment & Plan Note (Addendum)
 Referral to ENT Continue Allegra, may add in nasal spray to see if this will help

## 2024-02-05 NOTE — Telephone Encounter (Signed)
 CRITICAL VALUE STICKER  CRITICAL VALUE: 119.48 Vitamin D    RECEIVER (on-site recipient of call):   DATE & TIME NOTIFIED: 02/05/2024 at 11:15am  MESSENGER (representative from lab): Rea  MD NOTIFIED: Yes, Corean Ku  TIME OF NOTIFICATION: 11:20am  RESPONSE:  Will notify patient next steps

## 2024-02-05 NOTE — Patient Instructions (Signed)
 Continue medication regimen

## 2024-02-05 NOTE — Assessment & Plan Note (Signed)
 Lipids today will dose adjust as needed

## 2024-02-06 NOTE — Telephone Encounter (Signed)
 Made patient aware.

## 2024-02-22 ENCOUNTER — Ambulatory Visit (INDEPENDENT_AMBULATORY_CARE_PROVIDER_SITE_OTHER): Admitting: Audiology

## 2024-02-22 DIAGNOSIS — H903 Sensorineural hearing loss, bilateral: Secondary | ICD-10-CM

## 2024-02-22 NOTE — Progress Notes (Signed)
  [address], [address] [place], [place] [address] [phone]  Audiological Evaluation    Name: [name]     DOB:   11/09/1950      MRN:   [id number]                                                                                     Service Date: 02/22/2024         Patient comes today after Reyes Cohen, PA-C sent a referral for a hearing evaluation due to concerns with hearing loss.   Symptoms Yes Details  Hearing loss  [x]  Her son reports concerns with her having hearing loss  Tinnitus  []    Ear pain/ infections/pressure  [x]  Ear infections per her MD, reports some pain in the right ear sometimes   Balance problems  []    Noise exposure history  []    Previous ear surgeries  []    Family history of hearing loss  []    Amplification  []    Other  []      Otoscopy: Right ear: Clear external ear canal and notable landmarks visualized on the tympanic membrane. Left ear:  Clear external ear canal and notable landmarks visualized on the tympanic membrane.  Tympanometry: Right ear: Type A- Normal external ear canal volume with normal middle ear pressure and tympanic membrane compliance. Left ear: Type A- Normal external ear canal volume with normal middle ear pressure and tympanic membrane compliance.    Pure tone Audiometry: Both ears- Normal sloping to moderately severe sensorineural hearing loss from 250 Hz - 8000 Hz.   Speech Audiometry: Right ear- Speech Reception Threshold (SRT) was obtained at 30 dBHL. Left ear-Speech Reception Threshold (SRT) was obtained at 30 dBHL.   Word Recognition Score Tested using NU-6 (recorded) Right ear: 100% was obtained at a presentation level of 70 dBHL with contralateral masking which is deemed as  excellent. Left ear: 100% was obtained at a presentation level of 70 dBHL with contralateral masking which is deemed as  excellent.   The hearing test results were completed under headphones and results are deemed to be of good  reliability. Test technique:  conventional      Recommendations: Follow up with ENT as scheduled.  Consider a communication needs assessment after medical clearance for hearing aids is obtained.   Meira Wahba MARIE LEROUX-MARTINEZ, AUD

## 2024-02-26 ENCOUNTER — Encounter (INDEPENDENT_AMBULATORY_CARE_PROVIDER_SITE_OTHER): Payer: Self-pay | Admitting: Physician Assistant

## 2024-02-26 ENCOUNTER — Ambulatory Visit (INDEPENDENT_AMBULATORY_CARE_PROVIDER_SITE_OTHER): Admitting: Physician Assistant

## 2024-02-26 VITALS — BP 128/75 | HR 76

## 2024-02-26 DIAGNOSIS — M542 Cervicalgia: Secondary | ICD-10-CM | POA: Diagnosis not present

## 2024-02-26 DIAGNOSIS — H6991 Unspecified Eustachian tube disorder, right ear: Secondary | ICD-10-CM

## 2024-02-26 DIAGNOSIS — H9201 Otalgia, right ear: Secondary | ICD-10-CM | POA: Diagnosis not present

## 2024-02-26 MED ORDER — FLUTICASONE PROPIONATE 50 MCG/ACT NA SUSP
2.0000 | Freq: Every day | NASAL | 6 refills | Status: DC
Start: 2024-02-26 — End: 2024-05-20

## 2024-02-26 NOTE — Progress Notes (Signed)
 Dear Dr. Alvia, Here is my assessment for our mutual patient, [name]. Thank you for allowing me the opportunity to care for your patient. Please do not hesitate to contact me should you have any other questions. Sincerely, Chyrl Cohen PA-C  Otolaryngology Clinic Note Referring provider: Dr. Alvia HPI:  [name] is a 73 y.o. female kindly referred by Dr. Alvia   The patient is a 73 year old female presenting today with complaints of middle ear effusion.  She notes that 4 months ago she felt congestion in her right ear.  She followed up with her primary care provider who noted an effusion behind the right TM.  She was started on Allegra and felt like it helped.  She went back in June again and was told that there was still fluid.  She notes an occasional ache in the ear, no significant pain.  She also notes the symptoms have been associated with pain in the muscles of the right trapezius and cervical region.  She notes this is worse with movements.  She also notes a cracking sound in her right temporal mandibular joint.  She denies any significant clicking or popping, she notes she has flown recently without significant difficulty.  She notes she has had some generalized hearing loss but nothing specific.  She reports she does not grind her teeth at night.  She has seen a dentist with no etiology from her mouth.  No history of recurrent ear infections.  No history of seasonal allergies.    Independent Review of Additional Tests or Records:  Audiological evaluation 02/22/2024  Otoscopy: Right ear: Clear external ear canal and notable landmarks visualized on the tympanic membrane. Left ear:  Clear external ear canal and notable landmarks visualized on the tympanic membrane.   Tympanometry: Right ear: Type A- Normal external ear canal volume with normal middle ear pressure and tympanic membrane compliance. Left ear: Type A- Normal external ear canal volume with normal middle ear  pressure and tympanic membrane compliance.     Pure tone Audiometry: Both ears- Normal sloping to moderately severe sensorineural hearing loss from 250 Hz - 8000 Hz.   Speech Audiometry: Right ear- Speech Reception Threshold (SRT) was obtained at 30 dBHL. Left ear-Speech Reception Threshold (SRT) was obtained at 30 dBHL.   Word Recognition Score Tested using NU-6 (recorded) Right ear: 100% was obtained at a presentation level of 70 dBHL with contralateral masking which is deemed as  excellent. Left ear: 100% was obtained at a presentation level of 70 dBHL with contralateral masking which is deemed as  excellent.   The hearing test results were completed under headphones and results are deemed to be of good reliability. Test technique:  conventional    Sensorineural hearing loss with bilateral type A tympanometry   PMH/Meds/All/SocHx/FamHx/ROS:   Past Medical History:  Diagnosis Date   Arthritis    hands, feet   Atherosclerosis of coronary artery of native heart 09/2002   per cardiac cath 10-10-2002 mild non-obstructive of LAD   Cataract 2010   CKD (chronic kidney disease), stage II    Endometrial polyp    Hemorrhoids    History of cardiomyopathy in adulthood per cardiologist, dr morris, note in epic , [place] cardiomyopathy (resolved) and release by cardiology 2012;   10-01-2018 per pt denies any symptoms past several years   per cath 10-10-2002  ef 25% with wall motion abnormalities;  follow-up cath 11-22-2004  resolution wall motion abnormalities and normal LVF;   echo 11-25-2010  ef  55%, mild LAE,  trivial MR, PR, TR   HTN (hypertension)    Hypercholesterolemia    Right sided sciatica 12/12/2017   Type 2 diabetes mellitus (HCC)    followed by pcp     Past Surgical History:  Procedure Laterality Date   CARDIAC CATHETERIZATION  11/25/2004  dr morris   mild non-obstrucitve coronary atherosclerosis involving LAD,  normal LVF and resolution wall abnormalities from  previous cath   CARDIAC CATHETERIZATION  10/10/2002   dr charlanne   mild non-obstructive cad,  ef 25% with anterior akinesis, apical hypokinesis and inferior dyskinesis   CATARACT EXTRACTION W/ INTRAOCULAR LENS  IMPLANT, BILATERAL  07/2009   CESAREAN SECTION  x3   last one 10/ 1988    W/ BILATERAL TUBAL LIGATION WITH LAST C/S   CHOLECYSTECTOMY OPEN  1979   DILATATION & CURETTAGE/HYSTEROSCOPY WITH MYOSURE N/A 10/05/2018   Procedure: DILATATION & CURETTAGE/HYSTEROSCOPY WITH MYOSURE;  Surgeon: Rockney Evalene SQUIBB, MD;  Location: [facility];  Service: Gynecology;  Laterality: N/A;   JOINT REPLACEMENT  2024   KNEE ARTHROSCOPY WITH MEDIAL MENISECTOMY Left 02/25/2014   Procedure: LEFT KNEE ARTHROSCOPY WITH MEDIAL AND LATERAL MENISECTOMY, MICROFRACTURE, SYNOVECTOMY SUPRA PATELLA;  Surgeon: Tanda DELENA Heading, MD;  Location: [facility];  Service: Orthopedics;  Laterality: Left;   MASS EXCISION Right 04/15/2022   Procedure: MINOR EXCISION OF MASS;  Surgeon: Gladis Cough, MD;  Location: [facility];  Service: General;  Laterality: Right;   TOE SURGERY  1990   BILATERAL GREAT TOE'S   TUBAL LIGATION  1988    Family History  Problem Relation Age of Onset   Hypertension Mother    COPD Mother    Thyroid disease Mother    Heart attack Father    Heart disease Father    Thyroid disease Sister    Arthritis Sister        Rheumatoid   COPD Sister    Hypertension Sister    Yvone' disease Sister    Colon cancer Maternal Grandfather    ADD / ADHD Son    Heart disease Brother    Miscarriages / [place] Daughter      Social Connections: Unknown (02/02/2024)   Social Connection and Isolation Panel    Frequency of Communication with Friends and Family: Three times a week    Frequency of Social Gatherings with Friends and Family: Twice a week    Attends Religious Services: Not on Marketing executive or Organizations: No    Attends Engineer, structural: Not  on file    Marital Status: Divorced      Current Outpatient Medications:    aspirin EC 81 MG tablet, Take 81 mg by mouth daily., Disp: , Rfl:    bisoprolol -hydrochlorothiazide  (ZIAC ) 10-6.25 MG tablet, TAKE 1 TABLET BY MOUTH DAILY FOR BLOOD PRESSURE, Disp: 90 tablet, Rfl: 3   Cholecalciferol (VITAMIN D -3) 5000 UNITS TABS, Take 2 tablets by mouth every morning., Disp: , Rfl:    losartan  (COZAAR ) 50 MG tablet, TAKE 1 TABLET BY MOUTH DAILY FOR BLOOD PRESSURE, Disp: 90 tablet, Rfl: 1   metFORMIN  (GLUCOPHAGE -XR) 500 MG 24 hr tablet, TAKE 2 TABLETS BY MOUTH TWICE DAILY WITH MEALS FOR DIABETES, Disp: 360 tablet, Rfl: 3   rosuvastatin  (CRESTOR ) 20 MG tablet, TAKE 1 TABLET BY MOUTH DAILY FOR CHOLESTEROL M,W,F, Disp: 36 tablet, Rfl: 3   tirzepatide  (MOUNJARO ) 12.5 MG/0.5ML Pen, Inject   1 pen (12.5 mg)   into Skin  every 7 days   for Diabetes  (Dx: e11.29), Disp: 6 mL, Rfl: 1   Physical Exam:   BP 128/75   Pulse 76   SpO2 97%   Pertinent Findings  CN II-XII intact Bilateral EAC clear and TM intact with well pneumatized middle ear spacesl  Anterior rhinoscopy: Septum midline; bilateral inferior turbinates with mild hypertrophy No lesions of oral cavity/oropharynx; dentition in normal limits No obviously palpable neck masses/lymphadenopathy/thyromegaly No respiratory distress or stridor  Seprately Identifiable Procedures:  None  Impression & Plans:  Goldy Calandra is a 73 y.o. female with the following   Right ear ache-  73 year old female seen in our office for evaluation of right middle ear effusion.  I cannot appreciate effusion on exam, she has bilateral type a tympanometry.  She does have several other etiologies could be is causing her symptoms around her ear.  She has cervical neck pain as well as cracking and popping of her right TMJ.  Also on the differential would be eustachian tube dysfunction.  I would recommend she continue using the daily antihistamine as well as Flonase , she can  use nasal saline irrigation.  I like to see her back in 3 months for repeat evaluation or sooner as needed.  She verbalized understanding and agreement to today's plan.   - f/u 3 months   Thank you for allowing me the opportunity to care for your patient. Please do not hesitate to contact me should you have any other questions.  Sincerely, Chyrl Cohen PA-C [facility] Phone: [phone] Fax: [phone]  02/26/2024, 8:43 AM

## 2024-03-06 ENCOUNTER — Ambulatory Visit (INDEPENDENT_AMBULATORY_CARE_PROVIDER_SITE_OTHER)

## 2024-03-06 VITALS — Ht 62.0 in | Wt 177.0 lb

## 2024-03-06 DIAGNOSIS — Z0000 Encounter for general adult medical examination without abnormal findings: Secondary | ICD-10-CM | POA: Diagnosis not present

## 2024-03-06 NOTE — Patient Instructions (Signed)
 [name] , Thank you for taking time out of your busy schedule to complete your Annual Wellness Visit with me. I enjoyed our conversation and look forward to speaking with you again next year. I, as well as your care team,  appreciate your ongoing commitment to your health goals. Please review the following plan we discussed and let me know if I can assist you in the future. Your Game plan/ To Do List   Follow up Visits: Next Medicare AWV with our clinical staff: 03/07/2025.   Have you seen your provider in the last 6 months (3 months if uncontrolled diabetes)? Yes Next Office Visit with your provider: 05/07/2024.  Clinician Recommendations:  Aim for 30 minutes of exercise or brisk walking, 6-8 glasses of water, and 5 servings of fruits and vegetables each day. You are due for a foot exam and can get that during your next office visit with [facility].        This is a list of the screening recommended for you and due dates:  Health Maintenance  Topic Date Due   Complete foot exam   11/16/2021   COVID-19 Vaccine (4 - 2024-25 season) 04/16/2023   Mammogram  08/23/2023   DTaP/Tdap/Td vaccine (2 - Td or Tdap) 03/11/2024   Flu Shot  03/15/2024   Hemoglobin A1C  08/06/2024   DEXA scan (bone density measurement)  09/15/2024   Yearly kidney health urinalysis for diabetes  11/01/2024   Eye exam for diabetics  11/20/2024   Colon Cancer Screening  01/22/2025   Yearly kidney function blood test for diabetes  02/04/2025   Medicare Annual Wellness Visit  03/06/2025   Pneumococcal Vaccine for age over 37  Completed   Hepatitis C Screening  Completed   Zoster (Shingles) Vaccine  Completed   Hepatitis B Vaccine  Aged Out   HPV Vaccine  Aged Out   Meningitis B Vaccine  Aged Out    Advanced directives: (Copy Requested) Please bring a copy of your health care power of attorney and living will to the office to be added to your chart at your convenience. You can mail to [facility] [address] [place], [place] [address] or email to [email or web address] Advance Care Planning is important because it:  [x]  Makes sure you receive the medical care that is consistent with your values, goals, and preferences  [x]  It provides guidance to your family and loved ones and reduces their decisional burden about whether or not they are making the right decisions based on your wishes.  Follow the link provided in your after visit summary or read over the paperwork we have mailed to you to help you started getting your Advance Directives in place. If you need assistance in completing these, please reach out to us  so that we can help you!  See attachments for Preventive Care and Fall Prevention Tips.

## 2024-03-06 NOTE — Progress Notes (Signed)
 Subjective:   [name] is a 74 y.o. who presents for a Medicare Wellness preventive visit.  As a reminder, Annual Wellness Visits don't include a physical exam, and some assessments may be limited, especially if this visit is performed virtually. We may recommend an in-person follow-up visit with your provider if needed.  Visit Complete: Virtual I connected with  [name] on 03/06/24 by a audio enabled telemedicine application and verified that I am speaking with the correct person using two identifiers.  Patient Location: Home  Provider Location: Home Office  I discussed the limitations of evaluation and management by telemedicine. The patient expressed understanding and agreed to proceed.  Vital Signs: Because this visit was a virtual/telehealth visit, some criteria may be missing or patient reported. Any vitals not documented were not able to be obtained and vitals that have been documented are patient reported.  VideoDeclined- This patient declined Librarian, academic. Therefore the visit was completed with audio only.  Persons Participating in Visit: Patient.  AWV Questionnaire: Yes: Patient Medicare AWV questionnaire was completed by the patient on 03/05/2024; I have confirmed that all information answered by patient is correct and no changes since this date.  Cardiac Risk Factors include: advanced age (>53men, >57 women);hypertension;diabetes mellitus;Other (see comment);dyslipidemia, Risk factor comments: CKD stage2     Objective:    Today's Vitals   03/06/24 1437  Weight: 177 lb (80.3 kg)  Height: 5' 2 (1.575 m)   Body mass index is 32.37 kg/m.     03/06/2024    2:45 PM 03/08/2022   10:40 AM 06/02/2021    3:10 AM 02/19/2021   10:45 AM 02/13/2020    9:08 AM 01/03/2019   10:23 AM 10/05/2018    6:51 AM  Advanced Directives  Does Patient Have a Medical Advance Directive? 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  No - Patient declined  No - Patient declined No - Patient declined No - Patient declined    Copy of Healthcare Power of Attorney in Chart? No - copy requested  No - copy requested No - copy requested No - copy requested No - copy requested    Would patient like information on creating a medical advance directive?       No - Patient declined      Data saved with a previous flowsheet row definition    Current Medications (verified) Outpatient Encounter Medications as of 03/06/2024  Medication Sig   aspirin EC 81 MG tablet Take 81 mg by mouth daily.   bisoprolol -hydrochlorothiazide  (ZIAC ) 10-6.25 MG tablet TAKE 1 TABLET BY MOUTH DAILY FOR BLOOD PRESSURE   fluticasone  (FLONASE ) 50 MCG/ACT nasal spray Place 2 sprays into both nostrils daily.   losartan  (COZAAR ) 50 MG tablet TAKE 1 TABLET BY MOUTH DAILY FOR BLOOD PRESSURE   metFORMIN  (GLUCOPHAGE -XR) 500 MG 24 hr tablet TAKE 2 TABLETS BY MOUTH TWICE DAILY WITH MEALS FOR DIABETES   rosuvastatin  (CRESTOR ) 20 MG tablet TAKE 1 TABLET BY MOUTH DAILY FOR CHOLESTEROL M,W,F   tirzepatide  (MOUNJARO ) 12.5 MG/0.5ML Pen Inject   1 pen (12.5 mg)   into Skin  every 7 days   for Diabetes  (Dx: e11.29)   Cholecalciferol (VITAMIN D -3) 5000 UNITS  TABS Take 2 tablets by mouth every morning.   No facility-administered encounter medications on file as of 03/06/2024.    Allergies (verified) Dexamethasone    History: Past Medical History:  Diagnosis Date   Arthritis    hands, feet   Atherosclerosis of coronary artery of native heart 09/2002   per cardiac cath 10-10-2002 mild non-obstructive of LAD   Cataract 2010   CKD (chronic kidney disease), stage II    Endometrial polyp    Hemorrhoids     History of cardiomyopathy in adulthood per cardiologist, dr morris, note in epic , [place] cardiomyopathy (resolved) and release by cardiology 2012;   10-01-2018 per pt denies any symptoms past several years   per cath 10-10-2002  ef 25% with wall motion abnormalities;  follow-up cath 11-22-2004  resolution wall motion abnormalities and normal LVF;   echo 11-25-2010  ef 55%, mild LAE,  trivial MR, PR, TR   HTN (hypertension)    Hypercholesterolemia    Right sided sciatica 12/12/2017   Type 2 diabetes mellitus (HCC)    followed by pcp   Past Surgical History:  Procedure Laterality Date   CARDIAC CATHETERIZATION  11/25/2004  dr morris   mild non-obstrucitve coronary atherosclerosis involving LAD,  normal LVF and resolution wall abnormalities from previous cath   CARDIAC CATHETERIZATION  10/10/2002   dr charlanne   mild non-obstructive cad,  ef 25% with anterior akinesis, apical hypokinesis and inferior dyskinesis   CATARACT EXTRACTION W/ INTRAOCULAR LENS  IMPLANT, BILATERAL  07/2009   CESAREAN SECTION  x3   last one 10/ 1988    W/ BILATERAL TUBAL LIGATION WITH LAST C/S   CHOLECYSTECTOMY OPEN  1979   DILATATION & CURETTAGE/HYSTEROSCOPY WITH MYOSURE N/A 10/05/2018   Procedure: DILATATION & CURETTAGE/HYSTEROSCOPY WITH MYOSURE;  Surgeon: Rockney Evalene SQUIBB, MD;  Location: [facility];  Service: Gynecology;  Laterality: N/A;   JOINT REPLACEMENT  2024   KNEE ARTHROSCOPY WITH MEDIAL MENISECTOMY Left 02/25/2014   Procedure: LEFT KNEE ARTHROSCOPY WITH MEDIAL AND LATERAL MENISECTOMY, MICROFRACTURE, SYNOVECTOMY SUPRA PATELLA;  Surgeon: Tanda DELENA Heading, MD;  Location: [facility];  Service: Orthopedics;  Laterality: Left;   MASS EXCISION Right 04/15/2022   Procedure: MINOR EXCISION OF MASS;  Surgeon: Gladis Cough, MD;  Location: [facility];  Service: General;  Laterality: Right;   TOE SURGERY  1990   BILATERAL GREAT TOE'S   TUBAL LIGATION  1988   Family History  Problem  Relation Age of Onset   Hypertension Mother    COPD Mother    Thyroid disease Mother    Heart attack Father    Heart disease Father    Thyroid disease Sister    Arthritis Sister        Rheumatoid   COPD Sister    Hypertension Sister    Graves' disease Sister    Colon cancer Maternal Grandfather    ADD / ADHD Son    Heart disease Brother    Miscarriages / [place] Daughter    Social History   Socioeconomic History   Marital status: Divorced    Spouse name: Not on file   Number of children: 3   Years of education: Not on file   Highest education level: Bachelor's degree (e.g., BA, AB, BS)  Occupational History   Occupation: RETIRED  Tobacco Use   Smoking status: Never   Smokeless tobacco: Never  Vaping Use   Vaping status: Never Used  Substance and Sexual Activity   Alcohol use: Not  Currently    Comment: rare; twice yearly   Drug use: Never   Sexual activity: Not Currently    Birth control/protection: Surgical    Comment: 1st intercourse 73 yo-Fewer than 5 partners  Other Topics Concern   Not on file  Social History Narrative   Lives with ine of her children/2025   Social Drivers of Health   Financial Resource Strain: Low Risk  (03/06/2024)   Overall Financial Resource Strain (CARDIA)    Difficulty of Paying Living Expenses: Not hard at all  Food Insecurity: No Food Insecurity (03/06/2024)   Hunger Vital Sign    Worried About Running Out of Food in the Last Year: Never true    Ran Out of Food in the Last Year: Never true  Transportation Needs: No Transportation Needs (03/06/2024)   PRAPARE - Administrator, Civil Service (Medical): No    Lack of Transportation (Non-Medical): No  Physical Activity: Inactive (03/06/2024)   Exercise Vital Sign    Days of Exercise per Week: 0 days    Minutes of Exercise per Session: Not on file  Stress: No Stress Concern Present (03/06/2024)   [employer] of Occupational Health - Occupational Stress Questionnaire     Feeling of Stress: Not at all  Social Connections: Unknown (03/06/2024)   Social Connection and Isolation Panel    Frequency of Communication with Friends and Family: Three times a week    Frequency of Social Gatherings with Friends and Family: Twice a week    Attends Religious Services: Not on Marketing executive or Organizations: No    Attends Engineer, structural: Not on file    Marital Status: Divorced    Tobacco Counseling Counseling given: Not Answered    Clinical Intake:  Pre-visit preparation completed: Yes  Pain : No/denies pain     BMI - recorded: 32.37 Nutritional Status: BMI > 30  Obese Nutritional Risks: None Diabetes: Yes CBG done?: No Did pt. bring in CBG monitor from home?: No  Lab Results  Component Value Date   HGBA1C 5.2 02/05/2024   HGBA1C 5.4 11/02/2023   HGBA1C 5.3 07/11/2023     How often do you need to have someone help you when you read instructions, pamphlets, or other written materials from your doctor or pharmacy?: 1 - Never  Interpreter Needed?: No  Information entered by :: Bryan Goin, RMA   Activities of Daily Living     03/05/2024    4:24 PM 07/10/2023   10:04 PM  In your present state of health, do you have any difficulty performing the following activities:  Hearing? 0 0  Vision? 0 0  Difficulty concentrating or making decisions? 0 0  Walking or climbing stairs? 0 0  Dressing or bathing? 0 0  Doing errands, shopping? 0 0  Preparing Food and eating ? N   Using the Toilet? N   In the past six months, have you accidently leaked urine? N   Do you have problems with loss of bowel control? N   Managing your Medications? N   Managing your Finances? N   Housekeeping or managing your Housekeeping? N     Patient Care Team: Alvia Corean CROME, FNP as PCP - General (Family Medicine) Robinson Mayo, OD as Referring Physician (Optometry) Melita Drivers, MD as Consulting Physician (Orthopedic  Surgery)  I have updated your Care Teams any recent Medical Services you may have received from other providers in the past year.  Assessment:   This is a routine wellness examination for Reana.  Hearing/Vision screen Hearing Screening - Comments:: Denies hearing difficulties   Vision Screening - Comments:: Denies vision issues. Dr. Robinson    Goals Addressed             This Visit's Progress    Exercise 5x per week (15-20 min per time)   On track    Moderate intensity - enough to get heart rate up and be unable to speak in complete sentences       Depression Screen     03/06/2024    2:46 PM 07/10/2023   10:03 PM 04/03/2023    9:55 AM 03/08/2022   10:41 AM 11/29/2021    8:03 PM 02/19/2021   10:48 AM 11/16/2020    9:40 PM  PHQ 2/9 Scores  PHQ - 2 Score 0 0 0 0 0 0 0  PHQ- 9 Score 0          Fall Risk     03/06/2024    2:46 PM 07/10/2023   10:03 PM 03/08/2022   10:41 AM 11/29/2021    8:03 PM 02/19/2021   10:48 AM  Fall Risk   Falls in the past year? 0 0 0 0 0  Number falls in past yr: 0  0  0  Injury with Fall? 0  0  0  Risk for fall due to :  No Fall Risks  No Fall Risks No Fall Risks  Follow up Falls evaluation completed;Falls prevention discussed Falls evaluation completed;Education provided;Falls prevention discussed  Falls evaluation completed;Education provided;Falls prevention discussed  Falls prevention discussed;Falls evaluation completed      Data saved with a previous flowsheet row definition    MEDICARE RISK AT HOME:  Medicare Risk at Home Any stairs in or around the home?: (Patient-Rptd) No If so, are there any without handrails?: (Patient-Rptd) No Home free of loose throw rugs in walkways, pet beds, electrical cords, etc?: (Patient-Rptd) Yes Adequate lighting in your home to reduce risk of falls?: (Patient-Rptd) Yes Life alert?: (Patient-Rptd) No Use of a cane, walker or w/c?: (Patient-Rptd) No Grab bars in the bathroom?: (Patient-Rptd) No Shower  chair or bench in shower?: (Patient-Rptd) Yes Elevated toilet seat or a handicapped toilet?: (Patient-Rptd) No  TIMED UP AND GO:  Was the test performed?  No  Cognitive Function: Declined/Normal: No cognitive concerns noted by patient or family. Patient alert, oriented, able to answer questions appropriately and recall recent events. No signs of memory loss or confusion.        Immunizations Immunization History  Administered Date(s) Administered   Influenza, High Dose Seasonal PF 04/30/2015, 05/23/2017, 05/16/2020, 04/15/2022   Influenza-Unspecified 04/15/2013, 05/06/2014, 04/29/2019, 04/25/2021   PFIZER(Purple Top)SARS-COV-2 Vaccination 10/19/2019, 11/09/2019, 05/16/2020   Pneumococcal Conjugate-13 11/01/2016   Pneumococcal Polysaccharide-23 04/30/2015   Respiratory Syncytial Virus Vaccine,Recomb Aduvanted(Arexvy) 04/15/2022   Tdap 03/11/2014   Zoster Recombinant(Shingrix) 06/28/2021, 08/15/2021   Zoster, Live 05/23/2012    Screening Tests Health Maintenance  Topic Date Due   FOOT EXAM  11/16/2021   COVID-19 Vaccine (4 - 2024-25 season) 04/16/2023   MAMMOGRAM  08/23/2023   DTaP/Tdap/Td (2 - Td or Tdap) 03/11/2024   INFLUENZA VACCINE  03/15/2024   HEMOGLOBIN A1C  08/06/2024   DEXA SCAN  09/15/2024   Diabetic kidney evaluation - Urine ACR  11/01/2024   OPHTHALMOLOGY EXAM  11/20/2024   Colonoscopy  01/22/2025   Diabetic kidney evaluation - eGFR measurement  02/04/2025   Medicare Annual Wellness (AWV)  03/06/2025   Pneumococcal Vaccine: 50+ Years  Completed   Hepatitis C Screening  Completed   Zoster Vaccines- Shingrix  Completed   Hepatitis B Vaccines  Aged Out   HPV VACCINES  Aged Out   Meningococcal B Vaccine  Aged Out    Health Maintenance  Health Maintenance Due  Topic Date Due   FOOT EXAM  11/16/2021   COVID-19 Vaccine (4 - 2024-25 season) 04/16/2023   MAMMOGRAM  08/23/2023   Health Maintenance Items Addressed: Diabetic Foot Exam recommended, See Nurse  Notes at the end of this note  Additional Screening:  Vision Screening: Recommended annual ophthalmology exams for early detection of glaucoma and other disorders of the eye. Would you like a referral to an eye doctor? No    Dental Screening: Recommended annual dental exams for proper oral hygiene  Community Resource Referral / Chronic Care Management: CRR required this visit?  No   CCM required this visit?  No   Plan:    I have personally reviewed and noted the following in the patient's chart:   Medical and social history Use of alcohol, tobacco or illicit drugs  Current medications and supplements including opioid prescriptions. Patient is not currently taking opioid prescriptions. Functional ability and status Nutritional status Physical activity Advanced directives List of other physicians Hospitalizations, surgeries, and ER visits in previous 12 months Vitals Screenings to include cognitive, depression, and falls Referrals and appointments  In addition, I have reviewed and discussed with patient certain preventive protocols, quality metrics, and best practice recommendations. A written personalized care plan for preventive services as well as general preventive health recommendations were provided to patient.   Shannel Zahm L Melicia Esqueda, CMA   03/06/2024   After Visit Summary: (MyChart) Due to this being a telephonic visit, the after visit summary with patients personalized plan was offered to patient via MyChart   Notes: Patient is due for a foot exam.  She stated that she had a mammogram earlier this year.  I have sent a request for mammogram out today.

## 2024-04-02 ENCOUNTER — Ambulatory Visit: Payer: Medicare PPO | Admitting: Nurse Practitioner

## 2024-04-08 ENCOUNTER — Other Ambulatory Visit: Payer: Self-pay | Admitting: Nurse Practitioner

## 2024-04-22 ENCOUNTER — Encounter: Payer: Self-pay | Admitting: Family Medicine

## 2024-04-22 ENCOUNTER — Other Ambulatory Visit: Payer: Self-pay

## 2024-04-22 DIAGNOSIS — Z79899 Other long term (current) drug therapy: Secondary | ICD-10-CM

## 2024-04-22 MED ORDER — METFORMIN HCL ER 500 MG PO TB24: ORAL_TABLET | ORAL | 3 refills | Status: AC

## 2024-04-25 DIAGNOSIS — M25552 Pain in left hip: Secondary | ICD-10-CM | POA: Diagnosis not present

## 2024-05-07 ENCOUNTER — Ambulatory Visit: Admitting: Family Medicine

## 2024-05-16 DIAGNOSIS — M25552 Pain in left hip: Secondary | ICD-10-CM | POA: Diagnosis not present

## 2024-05-16 DIAGNOSIS — M7062 Trochanteric bursitis, left hip: Secondary | ICD-10-CM | POA: Diagnosis not present

## 2024-05-20 ENCOUNTER — Encounter: Payer: Self-pay | Admitting: Family Medicine

## 2024-05-20 ENCOUNTER — Ambulatory Visit: Admitting: Family Medicine

## 2024-05-20 VITALS — BP 130/80 | HR 62 | Temp 97.8°F | Ht 62.0 in | Wt 183.2 lb

## 2024-05-20 DIAGNOSIS — D1801 Hemangioma of skin and subcutaneous tissue: Secondary | ICD-10-CM | POA: Diagnosis not present

## 2024-05-20 DIAGNOSIS — I10 Essential (primary) hypertension: Secondary | ICD-10-CM | POA: Diagnosis not present

## 2024-05-20 DIAGNOSIS — E1122 Type 2 diabetes mellitus with diabetic chronic kidney disease: Secondary | ICD-10-CM | POA: Diagnosis not present

## 2024-05-20 DIAGNOSIS — R21 Rash and other nonspecific skin eruption: Secondary | ICD-10-CM | POA: Diagnosis not present

## 2024-05-20 DIAGNOSIS — E782 Mixed hyperlipidemia: Secondary | ICD-10-CM | POA: Insufficient documentation

## 2024-05-20 DIAGNOSIS — E66811 Obesity, class 1: Secondary | ICD-10-CM | POA: Diagnosis not present

## 2024-05-20 DIAGNOSIS — M7062 Trochanteric bursitis, left hip: Secondary | ICD-10-CM

## 2024-05-20 DIAGNOSIS — Z6833 Body mass index (BMI) 33.0-33.9, adult: Secondary | ICD-10-CM

## 2024-05-20 DIAGNOSIS — Z7985 Long-term (current) use of injectable non-insulin antidiabetic drugs: Secondary | ICD-10-CM

## 2024-05-20 DIAGNOSIS — E673 Hypervitaminosis D: Secondary | ICD-10-CM | POA: Diagnosis not present

## 2024-05-20 DIAGNOSIS — Z79899 Other long term (current) drug therapy: Secondary | ICD-10-CM | POA: Diagnosis not present

## 2024-05-20 DIAGNOSIS — N182 Chronic kidney disease, stage 2 (mild): Secondary | ICD-10-CM | POA: Diagnosis not present

## 2024-05-20 DIAGNOSIS — G63 Polyneuropathy in diseases classified elsewhere: Secondary | ICD-10-CM

## 2024-05-20 DIAGNOSIS — E6609 Other obesity due to excess calories: Secondary | ICD-10-CM

## 2024-05-20 LAB — LIPID PANEL
Cholesterol: 154 mg/dL (ref 0–200)
HDL: 58.7 mg/dL (ref >39.00)
LDL Cholesterol: 67 mg/dL (ref 0–99)
NonHDL: 94.91
Total CHOL/HDL Ratio: 3
Triglycerides: 141 mg/dL (ref 0.0–149.0)
VLDL: 28.2 mg/dL (ref 0.0–40.0)

## 2024-05-20 LAB — HEMOGLOBIN A1C: Hgb A1c MFr Bld: 5.4 % (ref 4.6–6.5)

## 2024-05-20 LAB — TSH: TSH: 4.25 u[IU]/mL (ref 0.35–5.50)

## 2024-05-20 LAB — VITAMIN D 25 HYDROXY (VIT D DEFICIENCY, FRACTURES): VITD: 83.09 ng/mL (ref 30.00–100.00)

## 2024-05-20 MED ORDER — TIRZEPATIDE 12.5 MG/0.5ML ~~LOC~~ SOAJ: SUBCUTANEOUS | 1 refills | Status: DC

## 2024-05-20 MED ORDER — BISOPROLOL-HYDROCHLOROTHIAZIDE 10-6.25 MG PO TABS
ORAL_TABLET | ORAL | 3 refills | Status: AC
Start: 2024-05-20 — End: ?

## 2024-05-20 NOTE — Patient Instructions (Signed)
 We are checking labs today, will be in contact with any results that require further attention  Refills sent to pharmacy today  Follow up with me Thur-Fri this week if the rash is not improving  Follow up with me in about 3 months for labs and medication management, sooner if needed.

## 2024-05-20 NOTE — Progress Notes (Signed)
 Established Patient Office Visit  Subjective:     Patient ID: [name], female    DOB: 21-Feb-1951, 73 y.o.   MRN: [id number]  Chief Complaint  Patient presents with   Follow-up    HPI  Discussed the use of AI scribe software for clinical note transcription with the patient, who gave verbal consent to proceed.  History of Present Illness [name] is a 73 year old female who presents for a three-month follow-up and medication refills.  Intertriginous rash - Rash developed under the breast a couple of days ago - Initially pruritic, now dry and scaly - No pain or blistering - Applies Neosporin at night - Similar lesion occurred one month ago, bled after scratching, and resolved with Neosporin in 2-3 days - No discoloration or similar rash on the contralateral side - No current pain or itching  Left hip and lower extremity pain - Pain localized to the left hip with radiation down the leg - History of arthritis confirmed by x-ray - Received a cortisone injection for symptom management  Distal lower extremity paresthesia - Tingling at the bottom of both feet, predominantly at night - Compression socks worn daily provide relief - Compression socks removed approximately two hours before bedtime - No numbness or significant discomfort - No lower extremity edema  Medication Management - Takes antihypertensive medication for blood pressure control - Takes Mounjaro  for type 2 diabetes mellitus - Takes rosuvastatin  three days per week for hyperlipidemia; has sufficient supply - Not taking vitamin A or a multivitamin with vitamin D  since last visit  Immunization status - Received recent vaccinations for influenza, pneumococcal disease, hepatitis A and B, and tetanus (TDAP) - Declined the new COVID-19 vaccine     ROS Per HPI      Objective:    BP 130/80 (BP Location: Left Arm, Patient Position: Sitting)   Pulse 62   Temp 97.8 F (36.6 C) (Temporal)   Ht  5' 2 (1.575 m)   Wt 183 lb 3.2 oz (83.1 kg)   SpO2 99%   BMI 33.51 kg/m    Physical Exam Vitals and nursing note reviewed.  Constitutional:      General: She is not in acute distress.    Appearance: Normal appearance. She is normal weight.  HENT:     Head: Normocephalic and atraumatic.     Right Ear: External ear normal.     Left Ear: External ear normal.     Nose: Nose normal.     Mouth/Throat:     Mouth: Mucous membranes are moist.     Pharynx: Oropharynx is clear.  Eyes:     Extraocular Movements: Extraocular movements intact.     Pupils: Pupils are equal, round, and reactive to light.  Cardiovascular:     Rate and Rhythm: Normal rate and regular rhythm.     Pulses: Normal pulses.     Heart sounds: Normal heart sounds.  Pulmonary:     Effort: Pulmonary effort is normal. No respiratory distress.     Breath sounds: Normal breath sounds. No wheezing, rhonchi or rales.  Chest:       Comments: Erythematous macular rash, no bleeding, no drainage, no lesions Musculoskeletal:        General: Normal range of motion.     Cervical back: Normal range of motion.     Right lower leg: No edema.     Left lower leg: No edema.  Lymphadenopathy:     Cervical: No cervical  adenopathy.  Neurological:     General: No focal deficit present.     Mental Status: She is alert and oriented to person, place, and time.  Psychiatric:        Mood and Affect: Mood normal.        Thought Content: Thought content normal.     No results found for any visits on 05/20/24.   BP Readings from Last 3 Encounters:  05/20/24 130/80  02/26/24 128/75  02/05/24 120/84   Wt Readings from Last 3 Encounters:  05/20/24 183 lb 3.2 oz (83.1 kg)  03/06/24 177 lb (80.3 kg)  02/05/24 177 lb (80.3 kg)      Last CBC Lab Results  Component Value Date   WBC 6.6 02/05/2024   HGB 14.1 02/05/2024   HCT 41.9 02/05/2024   MCV 84.9 02/05/2024   MCH 28.9 07/11/2023   RDW 13.7 02/05/2024   PLT 263.0  02/05/2024   Last metabolic panel Lab Results  Component Value Date   GLUCOSE 100 (H) 02/05/2024   NA 140 02/05/2024   K 4.1 02/05/2024   CL 101 02/05/2024   CO2 28 02/05/2024   BUN 13 02/05/2024   CREATININE 0.73 02/05/2024   GFR 81.31 02/05/2024   CALCIUM  10.0 02/05/2024   PROT 7.3 02/05/2024   ALBUMIN 4.7 02/05/2024   BILITOT 0.8 02/05/2024   ALKPHOS 57 02/05/2024   AST 16 02/05/2024   ALT 17 02/05/2024   ANIONGAP 12 06/02/2021   Last lipids Lab Results  Component Value Date   CHOL 140 02/05/2024   HDL 45.50 02/05/2024   LDLCALC 70 02/05/2024   TRIG 121.0 02/05/2024   CHOLHDL 3 02/05/2024   Last hemoglobin A1c Lab Results  Component Value Date   HGBA1C 5.2 02/05/2024   Last thyroid functions Lab Results  Component Value Date   TSH 2.42 07/11/2023   Last vitamin D  Lab Results  Component Value Date   VD25OH 119.48 (HH) 02/05/2024   Last vitamin B12 and Folate Lab Results  Component Value Date   VITAMINB12 358 06/02/2014         Assessment & Plan:   Assessment and Plan Assessment & Plan Rash, cherry angioma R breast Rash under the breast, dry and scaly, possibly from scratching. - Apply Neosporin at night. - Refer to dermatology. - Multiple cherry angiomas to trunk - Report if no improvement by Thursday or Friday.  Left hip trochanteric bursitis, with pain radiating down leg Left hip trochanteric bursitis with radiating pain. Cortisone shot given. X-ray showed mild arthritis. - Continue home physical therapy exercises.  Type 2 diabetes mellitus with diabetic chronic kidney disease, stage 2 Type 2 diabetes with stage 2 CKD.  Essential hypertension Essential hypertension managed with Losartan . - Refill blood pressure medication.  Mixed hyperlipidemia Mixed hyperlipidemia managed with Rosuvastatin .  Peripheral neuropathy r/t underlying disease, mild, feet Mild peripheral neuropathy in feet, tingling mostly at night. Compression socks  provide relief.  BMI 33 CBC normal  Obesity managed with Mounjaro  12.5 mg weekly. Current dose effective. - Refill Mounjaro  for three months.  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 - Recheck vitamin D  levels.  Medication management - Labs today, will dose adjust as needed -, Continue current regimen     Orders Placed This Encounter  Procedures   Hemoglobin A1c   Lipid panel   VITAMIN D  25 Hydroxy (Vit-D Deficiency, Fractures)   TSH   PTH, intact (no Ca)   Ambulatory referral to Dermatology  Referral Priority:   Routine    Referral Type:   Consultation    Referral Reason:   Specialty Services Required    Requested Specialty:   Dermatology    Number of Visits Requested:   1     Meds ordered this encounter  Medications   bisoprolol -hydrochlorothiazide  (ZIAC ) 10-6.25 MG tablet    Sig: TAKE 1 TABLET BY MOUTH DAILY FOR BLOOD PRESSURE    Dispense:  90 tablet    Refill:  3   tirzepatide  (MOUNJARO ) 12.5 MG/0.5ML Pen    Sig: Inject   1 pen (12.5 mg)   into Skin  every 7 days   for Diabetes  (Dx: e11.29)    Dispense:  6 mL    Refill:  1    E11.65    Return in about 3 months (around 08/20/2024).  Corean LITTIE Ku, FNP

## 2024-05-21 LAB — PARATHYROID HORMONE, INTACT (NO CA): PTH: 10 pg/mL — ABNORMAL LOW (ref 16–77)

## 2024-05-23 ENCOUNTER — Ambulatory Visit: Payer: Self-pay | Admitting: Family Medicine

## 2024-05-28 ENCOUNTER — Ambulatory Visit (INDEPENDENT_AMBULATORY_CARE_PROVIDER_SITE_OTHER): Admitting: Physician Assistant

## 2024-06-04 DIAGNOSIS — M25562 Pain in left knee: Secondary | ICD-10-CM | POA: Diagnosis not present

## 2024-06-04 DIAGNOSIS — M1712 Unilateral primary osteoarthritis, left knee: Secondary | ICD-10-CM | POA: Diagnosis not present

## 2024-07-10 ENCOUNTER — Ambulatory Visit: Payer: Medicare PPO | Admitting: Internal Medicine

## 2024-07-14 ENCOUNTER — Other Ambulatory Visit: Payer: Self-pay | Admitting: Family Medicine

## 2024-07-14 DIAGNOSIS — Z79899 Other long term (current) drug therapy: Secondary | ICD-10-CM

## 2024-08-22 ENCOUNTER — Telehealth: Payer: Self-pay | Admitting: Pharmacy Technician

## 2024-08-22 DIAGNOSIS — E782 Mixed hyperlipidemia: Secondary | ICD-10-CM

## 2024-08-22 NOTE — Progress Notes (Addendum)
" ° °  08/22/2024  Patient ID: [name], female   DOB: 08/31/1950, 74 y.o.   MRN: [id number]  Pharmacy Quality Measure Review  This patient failed the 2025 adherence measure for cholesterol (statin) medications this calendar year.   Medication: Rosuvastatin  20mg  3 times per week Last fill date: 01/12/2024 for 90 day supply. Previous fill for 90 day supply on 10/23/2023.  Called patient. HIPAA verified. Patient informs he medication was filled incorrectly on several occasions by the pharmacy. She informs the pharmacy was filling for 90 tablets at a 90 day supply but patient only takes medication 3 times a week. She informs she currently has about 75 tablets which is around a 6 month supply. She is waiting to fill until she gets a lower amount of tablets (around 10 or less) before she re orders.   [name], CPhT [facility] Office: [phone] Email: [name]  "

## 2024-08-26 LAB — HM MAMMOGRAPHY

## 2024-08-27 ENCOUNTER — Encounter: Payer: Self-pay | Admitting: Family Medicine

## 2024-08-29 ENCOUNTER — Ambulatory Visit: Admitting: Family Medicine

## 2024-08-29 ENCOUNTER — Encounter: Payer: Self-pay | Admitting: Family Medicine

## 2024-08-29 VITALS — BP 108/62 | HR 73 | Temp 97.6°F | Ht 62.0 in | Wt 171.6 lb

## 2024-08-29 DIAGNOSIS — R7989 Other specified abnormal findings of blood chemistry: Secondary | ICD-10-CM

## 2024-08-29 DIAGNOSIS — N182 Chronic kidney disease, stage 2 (mild): Secondary | ICD-10-CM | POA: Diagnosis not present

## 2024-08-29 DIAGNOSIS — E1122 Type 2 diabetes mellitus with diabetic chronic kidney disease: Secondary | ICD-10-CM | POA: Diagnosis not present

## 2024-08-29 DIAGNOSIS — E669 Obesity, unspecified: Secondary | ICD-10-CM | POA: Diagnosis not present

## 2024-08-29 DIAGNOSIS — Z79899 Other long term (current) drug therapy: Secondary | ICD-10-CM | POA: Diagnosis not present

## 2024-08-29 DIAGNOSIS — Z7985 Long-term (current) use of injectable non-insulin antidiabetic drugs: Secondary | ICD-10-CM

## 2024-08-29 DIAGNOSIS — J069 Acute upper respiratory infection, unspecified: Secondary | ICD-10-CM

## 2024-08-29 DIAGNOSIS — I10 Essential (primary) hypertension: Secondary | ICD-10-CM | POA: Diagnosis not present

## 2024-08-29 DIAGNOSIS — E782 Mixed hyperlipidemia: Secondary | ICD-10-CM

## 2024-08-29 DIAGNOSIS — M8580 Other specified disorders of bone density and structure, unspecified site: Secondary | ICD-10-CM

## 2024-08-29 DIAGNOSIS — E559 Vitamin D deficiency, unspecified: Secondary | ICD-10-CM | POA: Diagnosis not present

## 2024-08-29 LAB — COMPREHENSIVE METABOLIC PANEL WITH GFR
ALT: 13 U/L (ref 3–35)
AST: 12 U/L (ref 5–37)
Albumin: 4.4 g/dL (ref 3.5–5.2)
Alkaline Phosphatase: 81 U/L (ref 39–117)
BUN: 13 mg/dL (ref 6–23)
CO2: 25 meq/L (ref 19–32)
Calcium: 9.5 mg/dL (ref 8.4–10.5)
Chloride: 103 meq/L (ref 96–112)
Creatinine, Ser: 0.59 mg/dL (ref 0.40–1.20)
GFR: 88.75 mL/min
Glucose, Bld: 113 mg/dL — ABNORMAL HIGH (ref 70–99)
Potassium: 3.5 meq/L (ref 3.5–5.1)
Sodium: 138 meq/L (ref 135–145)
Total Bilirubin: 0.7 mg/dL (ref 0.2–1.2)
Total Protein: 6.9 g/dL (ref 6.0–8.3)

## 2024-08-29 LAB — MICROALBUMIN / CREATININE URINE RATIO
Creatinine,U: 27.5 mg/dL
Microalb Creat Ratio: UNDETERMINED mg/g (ref 0.0–30.0)
Microalb, Ur: <0.7 mg/dL

## 2024-08-29 LAB — LIPID PANEL
Cholesterol: 122 mg/dL (ref 28–200)
HDL: 45.6 mg/dL
LDL Cholesterol: 50 mg/dL (ref 10–99)
NonHDL: 75.96
Total CHOL/HDL Ratio: 3
Triglycerides: 130 mg/dL (ref 10.0–149.0)
VLDL: 26 mg/dL (ref 0.0–40.0)

## 2024-08-29 LAB — TSH: TSH: 2.55 u[IU]/mL (ref 0.35–5.50)

## 2024-08-29 LAB — HEMOGLOBIN A1C: Hgb A1c MFr Bld: 5.1 % (ref 4.6–6.5)

## 2024-08-29 LAB — VITAMIN D 25 HYDROXY (VIT D DEFICIENCY, FRACTURES): VITD: 79.7 ng/mL (ref 30.00–100.00)

## 2024-08-29 MED ORDER — TIRZEPATIDE 12.5 MG/0.5ML ~~LOC~~ SOAJ: SUBCUTANEOUS | 5 refills | Status: AC

## 2024-08-29 NOTE — Patient Instructions (Addendum)
 Continue current medication regimen.   Follow up with specialists as scheduled.   We are checking labs today, will be in contact with any results that require further attention.  Bone density has been ordered.   Follow-up with me in 6 mos for physical.

## 2024-08-29 NOTE — Progress Notes (Signed)
 "  Established Patient Office Visit  Subjective:     Patient ID: [name], female    DOB: Mar 29, 1951, 74 y.o.   MRN: [id number]  Chief Complaint  Patient presents with   Medical Management of Chronic Issues    HPI  Discussed the use of AI scribe software for clinical note transcription with the patient, who gave verbal consent to proceed.  History of Present Illness [name] is a 74 year old female who presents for a routine follow-up visit.  Upper respiratory symptoms - Recovering from severe head cold that began approximately two and a half weeks ago with scratchy throat and nasal congestion - Bedridden for four days during initial illness - Currently experiencing dry cough and mild sinus congestion - No fever, chest pain, flu-like symptoms, or gastrointestinal upset - Using cough drops for symptom relief  Cyst evaluation - Expresses concern regarding a cyst to the left breast, has US  scheduled for 09/10/24  Preventive health maintenance - Due for bone density scan - Remains up to date on vaccinations     ROS Per HPI      Objective:    BP 108/62 (BP Location: Left Arm, Patient Position: Sitting)   Pulse 73   Temp 97.6 F (36.4 C) (Temporal)   Ht 5' 2 (1.575 m)   Wt 171 lb 9.6 oz (77.8 kg)   SpO2 97%   BMI 31.39 kg/m    Physical Exam Vitals and nursing note reviewed.  Constitutional:      General: She is not in acute distress.    Appearance: Normal appearance.  HENT:     Head: Normocephalic and atraumatic.     Right Ear: External ear normal.     Left Ear: External ear normal.     Nose: Nose normal.     Mouth/Throat:     Mouth: Mucous membranes are moist.     Pharynx: Oropharynx is clear.  Eyes:     Extraocular Movements: Extraocular movements intact.     Pupils: Pupils are equal, round, and reactive to light.  Cardiovascular:     Rate and Rhythm: Normal rate and regular rhythm.     Pulses: Normal pulses.     Heart sounds: Normal  heart sounds.  Pulmonary:     Effort: Pulmonary effort is normal. No respiratory distress.     Breath sounds: Normal breath sounds. No wheezing, rhonchi or rales.  Musculoskeletal:        General: Normal range of motion.     Cervical back: Normal range of motion.     Right lower leg: No edema.     Left lower leg: No edema.  Lymphadenopathy:     Cervical: No cervical adenopathy.  Neurological:     General: No focal deficit present.     Mental Status: She is alert and oriented to person, place, and time.  Psychiatric:        Mood and Affect: Mood normal.        Thought Content: Thought content normal.     No results found for any visits on 08/29/24.   BP Readings from Last 3 Encounters:  08/29/24 108/62  05/20/24 130/80  02/26/24 128/75   Wt Readings from Last 3 Encounters:  08/29/24 171 lb 9.6 oz (77.8 kg)  05/20/24 183 lb 3.2 oz (83.1 kg)  03/06/24 177 lb (80.3 kg)      Last CBC Lab Results  Component Value Date   WBC 6.6 02/05/2024   HGB  14.1 02/05/2024   HCT 41.9 02/05/2024   MCV 84.9 02/05/2024   MCH 28.9 07/11/2023   RDW 13.7 02/05/2024   PLT 263.0 02/05/2024   Last metabolic panel Lab Results  Component Value Date   GLUCOSE 100 (H) 02/05/2024   NA 140 02/05/2024   K 4.1 02/05/2024   CL 101 02/05/2024   CO2 28 02/05/2024   BUN 13 02/05/2024   CREATININE 0.73 02/05/2024   GFR 81.31 02/05/2024   CALCIUM  10.0 02/05/2024   PROT 7.3 02/05/2024   ALBUMIN 4.7 02/05/2024   BILITOT 0.8 02/05/2024   ALKPHOS 57 02/05/2024   AST 16 02/05/2024   ALT 17 02/05/2024   ANIONGAP 12 06/02/2021   Last lipids Lab Results  Component Value Date   CHOL 154 05/20/2024   HDL 58.70 05/20/2024   LDLCALC 67 05/20/2024   TRIG 141.0 05/20/2024   CHOLHDL 3 05/20/2024   Last hemoglobin A1c Lab Results  Component Value Date   HGBA1C 5.4 05/20/2024   Last thyroid  functions Lab Results  Component Value Date   TSH 4.25 05/20/2024   Last vitamin D  Lab Results   Component Value Date   VD25OH 83.09 05/20/2024   Last vitamin B12 and Folate Lab Results  Component Value Date   VITAMINB12 358 06/02/2014         Assessment & Plan:   Assessment and Plan Assessment & Plan Type 2 diabetes mellitus with stage 2 chronic kidney disease without long term use of insulin , long term use noninsulin injectables - chronic, stable, requires ongoing monitoring - A1c, CMP, UCR today - Refilled Mounjaro   Essential hypertension - chronic, stable, requires ongoing monitoring - Continue bisprolol HCTZ - CMP, UCR today  Mixed hyperlipidemia - chronic, stable, requires ongoing monitoring - lipid levels today - continue rosuvastatin   Obesity - chronic, stable, requires ongoing monitoring - Refilled Mounjaro  - continue efforts in healthy diet and activity level  Low serum parathyroid  hormone Previous labs indicated low serum parathyroid  hormone levels. - Ordered labs to recheck parathyroid  hormone levels. - Will consider referral to endocrinology if levels remain abnormal.  Osteopenia, vit D deficiency Due for a bone density scan. - chronic, stable, requires ongoing monitoring - Ordered bone density scan. - vit D levels today - no longer taking supplemental vit D  Acute upper respiratory infection Recent upper respiratory infection with symptoms persisting for over two weeks, including dry cough and sinus congestion. Symptoms are improving with supportive care. - Use humidifier at night to alleviate cough.  Medication Management - labs today, will dose adjust medications as indicated Up to date on vaccinations, including flu vaccine. - Continue routine health maintenance and vaccinations.     Orders Placed This Encounter  Procedures   DG Bone Density    Please send to [facility]    Standing Status:   Future    Expiration Date:   08/29/2025    Reason for Exam (SYMPTOM  OR DIAGNOSIS REQUIRED):   post menopausal estrogen deficiency    Preferred  imaging location?:   External   Comprehensive metabolic panel with GFR    Release to patient:   Immediate [1]   Microalbumin / creatinine urine ratio    Release to patient:   Immediate   Hemoglobin A1c   Lipid panel   VITAMIN D  25 Hydroxy (Vit-D Deficiency, Fractures)   TSH   PTH, intact (no Ca)     Meds ordered this encounter  Medications   tirzepatide  (MOUNJARO ) 12.5 MG/0.5ML Pen  Sig: Inject   1 pen (12.5 mg)   into Skin  every 7 days   for Diabetes  (Dx: e11.29)    Dispense:  2 each    Refill:  5    E11.65    Return in about 6 months (around 02/26/2025) for cpe.  Corean LITTIE Ku, FNP   "

## 2024-08-30 LAB — PARATHYROID HORMONE, INTACT (NO CA): PTH: 11 pg/mL — ABNORMAL LOW (ref 16–77)

## 2024-09-03 MED ORDER — ROSUVASTATIN CALCIUM 20 MG PO TABS: ORAL_TABLET | ORAL | 3 refills | Status: AC

## 2024-09-03 NOTE — Addendum Note (Signed)
 Addended by: MERCEDA GRACES R on: 09/03/2024 10:04 AM   Modules accepted: Orders

## 2024-09-11 ENCOUNTER — Ambulatory Visit: Payer: Self-pay | Admitting: Family Medicine

## 2024-09-11 DIAGNOSIS — R7989 Other specified abnormal findings of blood chemistry: Secondary | ICD-10-CM

## 2024-09-17 ENCOUNTER — Encounter: Payer: Self-pay | Admitting: Family Medicine

## 2024-09-18 ENCOUNTER — Encounter: Payer: Self-pay | Admitting: Family Medicine

## 2024-12-18 ENCOUNTER — Ambulatory Visit: Admitting: Physician Assistant

## 2025-01-13 ENCOUNTER — Ambulatory Visit: Admitting: "Endocrinology

## 2025-03-07 ENCOUNTER — Ambulatory Visit
